# Patient Record
Sex: Female | Born: 1935
Health system: Southern US, Community
[De-identification: ages and names within clinical notes are randomized; demographics above are authoritative.]

## PROBLEM LIST (undated history)

## (undated) DIAGNOSIS — M8580 Other specified disorders of bone density and structure, unspecified site: Secondary | ICD-10-CM

## (undated) DIAGNOSIS — E785 Hyperlipidemia, unspecified: Secondary | ICD-10-CM

## (undated) DIAGNOSIS — E669 Obesity, unspecified: Secondary | ICD-10-CM

## (undated) DIAGNOSIS — G43909 Migraine, unspecified, not intractable, without status migrainosus: Secondary | ICD-10-CM

## (undated) DIAGNOSIS — D649 Anemia, unspecified: Secondary | ICD-10-CM

## (undated) DIAGNOSIS — IMO0002 Reserved for concepts with insufficient information to code with codable children: Secondary | ICD-10-CM

## (undated) DIAGNOSIS — K5790 Diverticulosis of intestine, part unspecified, without perforation or abscess without bleeding: Secondary | ICD-10-CM

## (undated) DIAGNOSIS — D229 Melanocytic nevi, unspecified: Secondary | ICD-10-CM

## (undated) DIAGNOSIS — F32A Depression, unspecified: Secondary | ICD-10-CM

## (undated) DIAGNOSIS — F329 Major depressive disorder, single episode, unspecified: Secondary | ICD-10-CM

## (undated) DIAGNOSIS — F410 Panic disorder [episodic paroxysmal anxiety] without agoraphobia: Secondary | ICD-10-CM

## (undated) DIAGNOSIS — K589 Irritable bowel syndrome without diarrhea: Secondary | ICD-10-CM

## (undated) DIAGNOSIS — T7840XA Allergy, unspecified, initial encounter: Secondary | ICD-10-CM

## (undated) DIAGNOSIS — I10 Essential (primary) hypertension: Secondary | ICD-10-CM

## (undated) HISTORY — DX: Migraine, unspecified, not intractable, without status migrainosus: G43.909

## (undated) HISTORY — DX: Melanocytic nevi, unspecified: D22.9

## (undated) HISTORY — DX: Obesity, unspecified: E66.9

## (undated) HISTORY — DX: Other specified disorders of bone density and structure, unspecified site: M85.80

## (undated) HISTORY — DX: Irritable bowel syndrome, unspecified: K58.9

## (undated) HISTORY — DX: Major depressive disorder, single episode, unspecified: F32.9

## (undated) HISTORY — DX: Diverticulosis of intestine, part unspecified, without perforation or abscess without bleeding: K57.90

## (undated) HISTORY — DX: Anemia, unspecified: D64.9

## (undated) HISTORY — DX: Panic disorder (episodic paroxysmal anxiety): F41.0

## (undated) HISTORY — DX: Hyperlipidemia, unspecified: E78.5

## (undated) HISTORY — DX: Reserved for concepts with insufficient information to code with codable children: IMO0002

## (undated) HISTORY — DX: Depression, unspecified: F32.A

## (undated) HISTORY — DX: Essential (primary) hypertension: I10

## (undated) HISTORY — DX: Allergy, unspecified, initial encounter: T78.40XA

---

## 2002-05-04 HISTORY — PX: OTHER SURGICAL HISTORY: SHX169

## 2005-01-19 ENCOUNTER — Ambulatory Visit: Payer: Self-pay | Admitting: Family Medicine

## 2005-02-19 ENCOUNTER — Ambulatory Visit: Payer: Self-pay | Admitting: Family Medicine

## 2005-02-19 ENCOUNTER — Other Ambulatory Visit: Admission: RE | Admit: 2005-02-19 | Discharge: 2005-02-19 | Payer: Self-pay | Admitting: Family Medicine

## 2005-04-02 ENCOUNTER — Ambulatory Visit: Payer: Self-pay | Admitting: Family Medicine

## 2005-05-22 ENCOUNTER — Ambulatory Visit: Payer: Self-pay | Admitting: Family Medicine

## 2005-07-07 ENCOUNTER — Ambulatory Visit: Payer: Self-pay | Admitting: Family Medicine

## 2005-08-03 ENCOUNTER — Encounter: Payer: Self-pay | Admitting: Family Medicine

## 2005-08-03 LAB — CONVERTED CEMR LAB

## 2005-08-25 ENCOUNTER — Ambulatory Visit: Payer: Self-pay | Admitting: Family Medicine

## 2005-09-30 ENCOUNTER — Ambulatory Visit: Payer: Self-pay | Admitting: Family Medicine

## 2005-10-07 ENCOUNTER — Ambulatory Visit: Payer: Self-pay | Admitting: Family Medicine

## 2005-10-26 ENCOUNTER — Ambulatory Visit: Payer: Self-pay | Admitting: Family Medicine

## 2005-11-30 ENCOUNTER — Ambulatory Visit: Payer: Self-pay | Admitting: Family Medicine

## 2006-01-25 ENCOUNTER — Ambulatory Visit: Payer: Self-pay | Admitting: Family Medicine

## 2006-02-04 ENCOUNTER — Ambulatory Visit: Payer: Self-pay | Admitting: Family Medicine

## 2006-02-09 DIAGNOSIS — G43909 Migraine, unspecified, not intractable, without status migrainosus: Secondary | ICD-10-CM | POA: Insufficient documentation

## 2006-02-09 DIAGNOSIS — F4310 Post-traumatic stress disorder, unspecified: Secondary | ICD-10-CM | POA: Insufficient documentation

## 2006-02-09 DIAGNOSIS — M899 Disorder of bone, unspecified: Secondary | ICD-10-CM | POA: Insufficient documentation

## 2006-02-09 DIAGNOSIS — E7800 Pure hypercholesterolemia, unspecified: Secondary | ICD-10-CM | POA: Insufficient documentation

## 2006-02-09 DIAGNOSIS — M949 Disorder of cartilage, unspecified: Secondary | ICD-10-CM

## 2006-02-09 DIAGNOSIS — I10 Essential (primary) hypertension: Secondary | ICD-10-CM | POA: Insufficient documentation

## 2006-02-16 ENCOUNTER — Encounter: Payer: Self-pay | Admitting: Family Medicine

## 2006-03-18 ENCOUNTER — Encounter: Payer: Self-pay | Admitting: Family Medicine

## 2006-03-18 LAB — CONVERTED CEMR LAB
Cholesterol: 201 mg/dL
HDL: 59 mg/dL
LDL Cholesterol: 111 mg/dL
Triglycerides: 153 mg/dL

## 2006-05-31 ENCOUNTER — Telehealth: Payer: Self-pay | Admitting: Family Medicine

## 2006-06-15 ENCOUNTER — Ambulatory Visit: Payer: Self-pay | Admitting: Family Medicine

## 2006-07-09 ENCOUNTER — Telehealth: Payer: Self-pay | Admitting: Family Medicine

## 2006-08-06 ENCOUNTER — Encounter: Payer: Self-pay | Admitting: Family Medicine

## 2006-08-12 ENCOUNTER — Ambulatory Visit: Payer: Self-pay | Admitting: Family Medicine

## 2006-08-27 ENCOUNTER — Telehealth: Payer: Self-pay | Admitting: Family Medicine

## 2006-09-23 ENCOUNTER — Ambulatory Visit: Payer: Self-pay | Admitting: Family Medicine

## 2006-09-23 DIAGNOSIS — J309 Allergic rhinitis, unspecified: Secondary | ICD-10-CM | POA: Insufficient documentation

## 2006-12-29 ENCOUNTER — Telehealth: Payer: Self-pay | Admitting: Family Medicine

## 2007-03-09 ENCOUNTER — Ambulatory Visit: Payer: Self-pay | Admitting: Family Medicine

## 2007-03-09 LAB — CONVERTED CEMR LAB
Cholesterol, target level: 200 mg/dL
HDL goal, serum: 40 mg/dL
LDL Goal: 130 mg/dL

## 2007-03-14 ENCOUNTER — Encounter: Payer: Self-pay | Admitting: Family Medicine

## 2007-03-15 ENCOUNTER — Encounter: Payer: Self-pay | Admitting: Family Medicine

## 2007-03-15 LAB — CONVERTED CEMR LAB
ALT: 21 units/L (ref 0–35)
AST: 21 units/L (ref 0–37)
Albumin: 4.5 g/dL (ref 3.5–5.2)
Alkaline Phosphatase: 66 units/L (ref 39–117)
BUN: 12 mg/dL (ref 6–23)
CO2: 24 meq/L (ref 19–32)
Calcium: 8.9 mg/dL (ref 8.4–10.5)
Chloride: 103 meq/L (ref 96–112)
Cholesterol: 207 mg/dL — ABNORMAL HIGH (ref 0–200)
Creatinine, Ser: 0.65 mg/dL (ref 0.40–1.20)
Glucose, Bld: 100 mg/dL — ABNORMAL HIGH (ref 70–99)
HDL: 72 mg/dL (ref >39)
LDL Cholesterol: 108 mg/dL — ABNORMAL HIGH (ref 0–99)
LDL Goal: 160 mg/dL
Potassium: 4.5 meq/L (ref 3.5–5.3)
Sodium: 139 meq/L (ref 135–145)
TSH: 1.532 microintl units/mL (ref 0.350–5.50)
Total Bilirubin: 0.4 mg/dL (ref 0.3–1.2)
Total CHOL/HDL Ratio: 2.9
Total Protein: 6.9 g/dL (ref 6.0–8.3)
Triglycerides: 134 mg/dL (ref <150)
VLDL: 27 mg/dL (ref 0–40)

## 2007-03-23 ENCOUNTER — Encounter: Payer: Self-pay | Admitting: Family Medicine

## 2007-05-10 ENCOUNTER — Telehealth: Payer: Self-pay | Admitting: Family Medicine

## 2007-05-11 ENCOUNTER — Encounter: Payer: Self-pay | Admitting: Family Medicine

## 2007-06-27 ENCOUNTER — Telehealth: Payer: Self-pay | Admitting: Family Medicine

## 2007-07-13 ENCOUNTER — Ambulatory Visit: Payer: Self-pay | Admitting: Family Medicine

## 2007-09-01 ENCOUNTER — Ambulatory Visit: Payer: Self-pay | Admitting: Gastroenterology

## 2007-09-09 ENCOUNTER — Ambulatory Visit: Payer: Self-pay | Admitting: Gastroenterology

## 2007-09-09 ENCOUNTER — Telehealth: Payer: Self-pay | Admitting: Family Medicine

## 2007-09-13 ENCOUNTER — Encounter: Payer: Self-pay | Admitting: Family Medicine

## 2007-09-14 ENCOUNTER — Ambulatory Visit: Payer: Self-pay | Admitting: Family Medicine

## 2007-09-15 LAB — CONVERTED CEMR LAB
ALT: 14 units/L (ref 0–35)
AST: 19 units/L (ref 0–37)
Albumin: 4.6 g/dL (ref 3.5–5.2)
Alkaline Phosphatase: 57 units/L (ref 39–117)
BUN: 16 mg/dL (ref 6–23)
CO2: 24 meq/L (ref 19–32)
Calcium: 9.2 mg/dL (ref 8.4–10.5)
Chloride: 106 meq/L (ref 96–112)
Cholesterol: 210 mg/dL — ABNORMAL HIGH (ref 0–200)
Creatinine, Ser: 0.64 mg/dL (ref 0.40–1.20)
Glucose, Bld: 85 mg/dL (ref 70–99)
HDL: 70 mg/dL (ref >39)
Helicobacter Pylori Antibody-IgG: 0.5
LDL Cholesterol: 113 mg/dL — ABNORMAL HIGH (ref 0–99)
Potassium: 4.5 meq/L (ref 3.5–5.3)
Sodium: 141 meq/L (ref 135–145)
TSH: 1.294 microintl units/mL (ref 0.350–5.50)
Total Bilirubin: 0.3 mg/dL (ref 0.3–1.2)
Total CHOL/HDL Ratio: 3
Total Protein: 6.5 g/dL (ref 6.0–8.3)
Triglycerides: 135 mg/dL (ref <150)
VLDL: 27 mg/dL (ref 0–40)

## 2007-09-20 ENCOUNTER — Encounter: Payer: Self-pay | Admitting: Family Medicine

## 2007-09-29 ENCOUNTER — Ambulatory Visit: Payer: Self-pay | Admitting: Family Medicine

## 2007-09-29 DIAGNOSIS — D239 Other benign neoplasm of skin, unspecified: Secondary | ICD-10-CM | POA: Insufficient documentation

## 2007-10-03 ENCOUNTER — Telehealth: Payer: Self-pay | Admitting: Family Medicine

## 2007-10-05 ENCOUNTER — Encounter: Payer: Self-pay | Admitting: Family Medicine

## 2007-10-05 ENCOUNTER — Ambulatory Visit (HOSPITAL_COMMUNITY): Payer: Self-pay | Admitting: Psychiatry

## 2007-10-19 ENCOUNTER — Ambulatory Visit (HOSPITAL_COMMUNITY): Payer: Self-pay | Admitting: Psychiatry

## 2007-10-31 ENCOUNTER — Telehealth: Payer: Self-pay | Admitting: Family Medicine

## 2007-11-02 ENCOUNTER — Ambulatory Visit (HOSPITAL_COMMUNITY): Payer: Self-pay | Admitting: Psychiatry

## 2007-11-25 ENCOUNTER — Telehealth: Payer: Self-pay | Admitting: Family Medicine

## 2008-01-05 ENCOUNTER — Ambulatory Visit: Payer: Self-pay | Admitting: Family Medicine

## 2008-01-06 LAB — CONVERTED CEMR LAB
Basophils Absolute: 0 10*3/uL (ref 0.0–0.1)
Basophils Relative: 0 % (ref 0–1)
Eosinophils Absolute: 0 10*3/uL (ref 0.0–0.7)
Eosinophils Relative: 1 % (ref 0–5)
HCT: 40.5 % (ref 36.0–46.0)
Hemoglobin: 13.1 g/dL (ref 12.0–15.0)
Lymphocytes Relative: 38 % (ref 12–46)
Lymphs Abs: 1.9 10*3/uL (ref 0.7–4.0)
MCHC: 32.3 g/dL (ref 30.0–36.0)
MCV: 89 fL (ref 78.0–100.0)
Monocytes Absolute: 0.3 10*3/uL (ref 0.1–1.0)
Monocytes Relative: 6 % (ref 3–12)
Neutro Abs: 2.8 10*3/uL (ref 1.7–7.7)
Neutrophils Relative %: 55 % (ref 43–77)
Platelets: 252 10*3/uL (ref 150–400)
RBC: 4.55 M/uL (ref 3.87–5.11)
RDW: 15.4 % (ref 11.5–15.5)
WBC: 5 10*3/uL (ref 4.0–10.5)

## 2008-01-26 ENCOUNTER — Telehealth: Payer: Self-pay | Admitting: Family Medicine

## 2008-02-01 ENCOUNTER — Encounter: Admission: RE | Admit: 2008-02-01 | Discharge: 2008-02-01 | Payer: Self-pay | Admitting: Family Medicine

## 2008-02-02 ENCOUNTER — Ambulatory Visit: Payer: Self-pay | Admitting: Family Medicine

## 2008-02-22 ENCOUNTER — Ambulatory Visit: Payer: Self-pay | Admitting: Family Medicine

## 2008-03-07 ENCOUNTER — Ambulatory Visit (HOSPITAL_COMMUNITY): Payer: Self-pay | Admitting: Psychiatry

## 2008-03-22 ENCOUNTER — Encounter: Payer: Self-pay | Admitting: Family Medicine

## 2008-03-22 LAB — CONVERTED CEMR LAB
Cholesterol: 169 mg/dL
Glucose, Bld: 93 mg/dL
HDL: 62 mg/dL
LDL Cholesterol: 89 mg/dL
Triglycerides: 92 mg/dL

## 2008-04-03 ENCOUNTER — Encounter: Payer: Self-pay | Admitting: Family Medicine

## 2008-04-04 ENCOUNTER — Ambulatory Visit (HOSPITAL_COMMUNITY): Payer: Self-pay | Admitting: Psychiatry

## 2008-04-12 ENCOUNTER — Ambulatory Visit: Payer: Self-pay | Admitting: Family Medicine

## 2008-04-12 DIAGNOSIS — S239XXA Sprain of unspecified parts of thorax, initial encounter: Secondary | ICD-10-CM | POA: Insufficient documentation

## 2008-04-18 ENCOUNTER — Ambulatory Visit (HOSPITAL_COMMUNITY): Payer: Self-pay | Admitting: Psychiatry

## 2008-05-01 ENCOUNTER — Telehealth: Payer: Self-pay | Admitting: Family Medicine

## 2008-05-29 ENCOUNTER — Telehealth (INDEPENDENT_AMBULATORY_CARE_PROVIDER_SITE_OTHER): Payer: Self-pay | Admitting: *Deleted

## 2008-06-04 ENCOUNTER — Telehealth: Payer: Self-pay | Admitting: Family Medicine

## 2008-06-12 ENCOUNTER — Encounter: Payer: Self-pay | Admitting: Family Medicine

## 2008-07-10 ENCOUNTER — Ambulatory Visit: Payer: Self-pay | Admitting: Family Medicine

## 2008-08-07 ENCOUNTER — Encounter: Payer: Self-pay | Admitting: Family Medicine

## 2008-08-07 ENCOUNTER — Ambulatory Visit: Payer: Self-pay | Admitting: Nurse Practitioner

## 2008-09-14 ENCOUNTER — Encounter: Payer: Self-pay | Admitting: Family Medicine

## 2008-09-18 ENCOUNTER — Encounter: Payer: Self-pay | Admitting: Family Medicine

## 2008-09-26 ENCOUNTER — Encounter: Payer: Self-pay | Admitting: Family Medicine

## 2008-09-27 ENCOUNTER — Ambulatory Visit: Payer: Self-pay | Admitting: Family Medicine

## 2008-10-26 ENCOUNTER — Encounter: Payer: Self-pay | Admitting: Family Medicine

## 2008-11-28 ENCOUNTER — Ambulatory Visit: Payer: Self-pay | Admitting: Family Medicine

## 2008-11-28 DIAGNOSIS — M5136 Other intervertebral disc degeneration, lumbar region: Secondary | ICD-10-CM | POA: Insufficient documentation

## 2008-11-28 DIAGNOSIS — M51369 Other intervertebral disc degeneration, lumbar region without mention of lumbar back pain or lower extremity pain: Secondary | ICD-10-CM | POA: Insufficient documentation

## 2008-12-19 ENCOUNTER — Encounter: Admission: RE | Admit: 2008-12-19 | Discharge: 2008-12-19 | Payer: Self-pay | Admitting: Family Medicine

## 2008-12-19 ENCOUNTER — Ambulatory Visit: Payer: Self-pay | Admitting: Family Medicine

## 2008-12-31 ENCOUNTER — Telehealth: Payer: Self-pay | Admitting: Family Medicine

## 2009-01-02 ENCOUNTER — Encounter: Admission: RE | Admit: 2009-01-02 | Discharge: 2009-04-02 | Payer: Self-pay | Admitting: Family Medicine

## 2009-01-03 ENCOUNTER — Encounter: Payer: Self-pay | Admitting: Family Medicine

## 2009-01-17 ENCOUNTER — Ambulatory Visit: Payer: Self-pay | Admitting: Family Medicine

## 2009-01-28 ENCOUNTER — Encounter: Payer: Self-pay | Admitting: Family Medicine

## 2009-02-05 ENCOUNTER — Encounter: Payer: Self-pay | Admitting: Family Medicine

## 2009-02-25 ENCOUNTER — Encounter: Payer: Self-pay | Admitting: Family Medicine

## 2009-02-26 ENCOUNTER — Encounter: Payer: Self-pay | Admitting: Family Medicine

## 2009-02-28 ENCOUNTER — Encounter: Payer: Self-pay | Admitting: Family Medicine

## 2009-03-06 ENCOUNTER — Encounter: Payer: Self-pay | Admitting: Family Medicine

## 2009-03-06 LAB — CONVERTED CEMR LAB
ALT: 15 units/L
AST: 23 units/L
Albumin: 4.5 g/dL
Alkaline Phosphatase: 81 units/L
BUN: 16 mg/dL
CO2: 25 meq/L
Calcium: 9.5 mg/dL
Chloride: 100 meq/L
Cholesterol: 164 mg/dL
Creatinine, Ser: 0.69 mg/dL
Glucose, Bld: 86 mg/dL
HDL: 73 mg/dL
LDL Cholesterol: 61 mg/dL
Potassium: 4.4 meq/L
Sodium: 138 meq/L
Triglyceride fasting, serum: 152 mg/dL

## 2009-03-07 ENCOUNTER — Encounter: Payer: Self-pay | Admitting: Family Medicine

## 2009-03-17 ENCOUNTER — Encounter: Admission: RE | Admit: 2009-03-17 | Discharge: 2009-03-17 | Payer: Self-pay | Admitting: Orthopedic Surgery

## 2009-03-20 ENCOUNTER — Encounter: Payer: Self-pay | Admitting: Family Medicine

## 2009-04-16 ENCOUNTER — Ambulatory Visit: Payer: Self-pay | Admitting: Family Medicine

## 2009-04-18 ENCOUNTER — Encounter: Payer: Self-pay | Admitting: Family Medicine

## 2009-04-19 ENCOUNTER — Telehealth: Payer: Self-pay | Admitting: Family Medicine

## 2009-05-06 ENCOUNTER — Telehealth: Payer: Self-pay | Admitting: Family Medicine

## 2009-05-13 ENCOUNTER — Telehealth: Payer: Self-pay | Admitting: Family Medicine

## 2009-06-11 ENCOUNTER — Telehealth (INDEPENDENT_AMBULATORY_CARE_PROVIDER_SITE_OTHER): Payer: Self-pay | Admitting: *Deleted

## 2009-06-28 ENCOUNTER — Ambulatory Visit: Payer: Self-pay | Admitting: Family Medicine

## 2009-07-03 ENCOUNTER — Encounter: Payer: Self-pay | Admitting: Family Medicine

## 2009-07-08 ENCOUNTER — Encounter: Payer: Self-pay | Admitting: Family Medicine

## 2009-07-12 ENCOUNTER — Telehealth: Payer: Self-pay | Admitting: Family Medicine

## 2009-07-18 ENCOUNTER — Encounter: Payer: Self-pay | Admitting: Family Medicine

## 2009-07-19 ENCOUNTER — Ambulatory Visit: Payer: Self-pay | Admitting: Family Medicine

## 2009-07-19 DIAGNOSIS — H9190 Unspecified hearing loss, unspecified ear: Secondary | ICD-10-CM | POA: Insufficient documentation

## 2009-07-31 ENCOUNTER — Encounter: Payer: Self-pay | Admitting: Family Medicine

## 2009-07-31 LAB — HM MAMMOGRAPHY: HM Mammogram: NORMAL

## 2009-08-02 ENCOUNTER — Telehealth: Payer: Self-pay | Admitting: Family Medicine

## 2009-08-20 ENCOUNTER — Encounter: Payer: Self-pay | Admitting: Family Medicine

## 2009-08-30 ENCOUNTER — Ambulatory Visit: Payer: Self-pay | Admitting: Family Medicine

## 2009-09-20 ENCOUNTER — Ambulatory Visit: Payer: Self-pay | Admitting: Family Medicine

## 2009-10-09 ENCOUNTER — Telehealth: Payer: Self-pay | Admitting: Family Medicine

## 2009-10-09 ENCOUNTER — Ambulatory Visit: Payer: Self-pay | Admitting: Family Medicine

## 2009-11-20 ENCOUNTER — Ambulatory Visit: Payer: Self-pay | Admitting: Family Medicine

## 2009-11-20 LAB — CONVERTED CEMR LAB
Bilirubin Urine: NEGATIVE
Glucose, Urine, Semiquant: NEGATIVE
Ketones, urine, test strip: NEGATIVE
Nitrite: NEGATIVE
Protein, U semiquant: 100
Specific Gravity, Urine: 1.01
Urobilinogen, UA: 0.2
pH: 5.5

## 2009-11-21 ENCOUNTER — Encounter: Payer: Self-pay | Admitting: Family Medicine

## 2009-12-06 ENCOUNTER — Ambulatory Visit: Payer: Self-pay | Admitting: Family Medicine

## 2009-12-06 LAB — CONVERTED CEMR LAB
Bilirubin Urine: NEGATIVE
Blood in Urine, dipstick: NEGATIVE
Glucose, Urine, Semiquant: NEGATIVE
Ketones, urine, test strip: NEGATIVE
Nitrite: NEGATIVE
Protein, U semiquant: NEGATIVE
Specific Gravity, Urine: 1.01
Urobilinogen, UA: 0.2
WBC Urine, dipstick: NEGATIVE
pH: 6.5

## 2010-01-03 ENCOUNTER — Ambulatory Visit: Payer: Self-pay | Admitting: Family Medicine

## 2010-01-03 ENCOUNTER — Telehealth: Payer: Self-pay | Admitting: Family Medicine

## 2010-01-03 LAB — CONVERTED CEMR LAB
Bilirubin Urine: NEGATIVE
Blood in Urine, dipstick: NEGATIVE
Glucose, Urine, Semiquant: NEGATIVE
Ketones, urine, test strip: NEGATIVE
Nitrite: NEGATIVE
Protein, U semiquant: NEGATIVE
Specific Gravity, Urine: 1.01
Urobilinogen, UA: 0.2
pH: 6

## 2010-01-04 ENCOUNTER — Encounter: Payer: Self-pay | Admitting: Family Medicine

## 2010-01-08 ENCOUNTER — Ambulatory Visit: Payer: Self-pay | Admitting: Family Medicine

## 2010-01-08 LAB — CONVERTED CEMR LAB
Bilirubin Urine: NEGATIVE
Blood in Urine, dipstick: NEGATIVE
Glucose, Urine, Semiquant: NEGATIVE
Ketones, urine, test strip: NEGATIVE
Nitrite: NEGATIVE
Protein, U semiquant: NEGATIVE
Specific Gravity, Urine: 1.01
Urobilinogen, UA: 0.2
WBC Urine, dipstick: NEGATIVE
pH: 6

## 2010-01-09 ENCOUNTER — Encounter: Payer: Self-pay | Admitting: Family Medicine

## 2010-01-24 ENCOUNTER — Ambulatory Visit: Payer: Self-pay | Admitting: Family Medicine

## 2010-02-19 ENCOUNTER — Encounter (INDEPENDENT_AMBULATORY_CARE_PROVIDER_SITE_OTHER): Payer: Self-pay | Admitting: *Deleted

## 2010-02-19 ENCOUNTER — Ambulatory Visit: Payer: Self-pay | Admitting: Family Medicine

## 2010-02-19 DIAGNOSIS — K529 Noninfective gastroenteritis and colitis, unspecified: Secondary | ICD-10-CM | POA: Insufficient documentation

## 2010-02-24 ENCOUNTER — Ambulatory Visit: Payer: Self-pay | Admitting: Gastroenterology

## 2010-03-19 ENCOUNTER — Encounter: Payer: Self-pay | Admitting: Family Medicine

## 2010-03-31 ENCOUNTER — Ambulatory Visit: Payer: Self-pay | Admitting: Gastroenterology

## 2010-04-04 ENCOUNTER — Encounter: Payer: Self-pay | Admitting: Gastroenterology

## 2010-04-09 ENCOUNTER — Ambulatory Visit: Payer: Self-pay | Admitting: Family Medicine

## 2010-04-09 DIAGNOSIS — J069 Acute upper respiratory infection, unspecified: Secondary | ICD-10-CM | POA: Insufficient documentation

## 2010-04-09 LAB — CONVERTED CEMR LAB: Rapid Strep: NEGATIVE

## 2010-05-15 ENCOUNTER — Ambulatory Visit
Admission: RE | Admit: 2010-05-15 | Discharge: 2010-05-15 | Payer: Self-pay | Source: Home / Self Care | Attending: Gastroenterology | Admitting: Gastroenterology

## 2010-06-03 NOTE — Assessment & Plan Note (Signed)
Summary: NAUSEA   Vital Signs:  Patient Profile:   76 Years Old Female CC:      Nausea, diarrhea, lower abdominal pain X 1 week Height:     67.25 inches Weight:      207 pounds O2 Sat:      95 % O2 treatment:    Room Air Temp:     97.4 degrees F oral Pulse rate:   72 / minute Pulse rhythm:   regular Resp:     14 per minute BP sitting:   107 / 69  (right arm) Cuff size:   large  Pt. in pain?   yes    Location:   abdomen    Type:       cramping  Vitals Entered By: Lajean Saver RN (October 09, 2009 11:15 AM)                   Prior Medication List:  ENALAPRIL MALEATE 5 MG TABS (ENALAPRIL MALEATE) Take 1 tablet by mouth once a day METOPROLOL TARTRATE 25 MG  TABS (METOPROLOL TARTRATE) Take 1 tablet by mouth two times a day ZOCOR 40 MG  TABS (SIMVASTATIN) Take 1 tablet by mouth once a day at bedtime ALPRAZOLAM 0.5 MG  TABS (ALPRAZOLAM) Take 1 tablet by mouth two times a day as needed TRAZODONE HCL 50 MG  TABS (TRAZODONE HCL) Take 1 tablet by mouth once a day at bedtime FLEXERIL 10 MG TABS (CYCLOBENZAPRINE HCL) Take 1 tablet by mouth tid prn muscle spasm. ZOLOFT 100 MG TABS (SERTRALINE HCL) 2 tabs by mouth daily. TRAMADOL HCL 50 MG TABS (TRAMADOL HCL) 1 tab by mouth two times a day as needed back pain ABILIFY 2 MG TABS (ARIPIPRAZOLE) 1 tab by mouth qAM   Updated Prior Medication List: ENALAPRIL MALEATE 5 MG TABS (ENALAPRIL MALEATE) Take 1 tablet by mouth once a day METOPROLOL TARTRATE 25 MG  TABS (METOPROLOL TARTRATE) Take 1 tablet by mouth two times a day ZOCOR 40 MG  TABS (SIMVASTATIN) Take 1 tablet by mouth once a day at bedtime ALPRAZOLAM 0.5 MG  TABS (ALPRAZOLAM) Take 1 tablet by mouth two times a day as needed TRAZODONE HCL 50 MG  TABS (TRAZODONE HCL) Take 1 tablet by mouth once a day at bedtime FLEXERIL 10 MG TABS (CYCLOBENZAPRINE HCL) Take 1 tablet by mouth tid prn muscle spasm. ZOLOFT 100 MG TABS (SERTRALINE HCL) 2 tabs by mouth daily. IMODIUM A-D 2 MG TABS  (LOPERAMIDE HCL) as needed  Current Allergies (reviewed today): ! KEFLEX ! SULFA ! * INSECT VENOM ! * SHELLFISHHistory of Present Illness Chief Complaint: Nausea, diarrhea, lower abdominal pain X 1 week History of Present Illness: Pain in abdomen about a week ago it started. Sarvel times last week she would eat and have pain and urgency to defecate. When she did defacate it was forceful. The pain was relieved and no problem until the next evening and it started again. Things started gettting better until yesterdaywhen she had a relapse after eating. Today she feels sick and nauseous. She does have IBS that is usually controlled by diet.   Current Problems: DIARRHEA (ICD-787.91) NAUSEA (ICD-787.02) ABDOMINAL PAIN, LEFT LOWER QUADRANT (ICD-789.04) UNSPECIFIED HEARING LOSS (ICD-389.9) LUMBAR SPRAIN AND STRAIN (ICD-847.2) BACK STRAIN, THORACIC (ICD-847.1) NEVUS, ATYPICAL (ICD-216.9) GASTRIC ULCER (ICD-531.90) RHINITIS, ALLERGIC NOS (ICD-477.9) PANIC ATTACKS (ICD-300.01) OSTEOPENIA (ICD-733.90) MIGRAINE, UNSPEC., W/O INTRACTABLE MIGRAINE (ICD-346.90) HYPERTENSION, BENIGN SYSTEMIC (ICD-401.1) HYPERCHOLESTEROLEMIA (ICD-272.0) DERMATITIS, PERIORAL (ICD-695.3)   Current Meds ENALAPRIL MALEATE 5 MG TABS (ENALAPRIL  MALEATE) Take 1 tablet by mouth once a day METOPROLOL TARTRATE 25 MG  TABS (METOPROLOL TARTRATE) Take 1 tablet by mouth two times a day ZOCOR 40 MG  TABS (SIMVASTATIN) Take 1 tablet by mouth once a day at bedtime ALPRAZOLAM 0.5 MG  TABS (ALPRAZOLAM) Take 1 tablet by mouth two times a day as needed TRAZODONE HCL 50 MG  TABS (TRAZODONE HCL) Take 1 tablet by mouth once a day at bedtime FLEXERIL 10 MG TABS (CYCLOBENZAPRINE HCL) Take 1 tablet by mouth tid prn muscle spasm. ZOLOFT 100 MG TABS (SERTRALINE HCL) 2 tabs by mouth daily. IMODIUM A-D 2 MG TABS (LOPERAMIDE HCL) as needed AUGMENTIN 875-125 MG TABS (AMOXICILLIN-POT CLAVULANATE) 1 by mouth 2 times daily METRONIDAZOLE 500 MG  TABS  (METRONIDAZOLE) si 1 by mouth twice a day do not  take w/etoh PROMETHAZINE HCL 25 MG  TABS (PROMETHAZINE HCL) sig 1 by mouth q 6-8hrs prn  REVIEW OF SYSTEMS Constitutional Symptoms       Complains of chills.     Denies fever, night sweats, weight loss, weight gain, and fatigue.  Eyes       Denies change in vision, eye pain, eye discharge, glasses, contact lenses, and eye surgery. Ear/Nose/Throat/Mouth       Complains of hearing loss/aids.      Denies change in hearing, ear pain, ear discharge, dizziness, frequent runny nose, frequent nose bleeds, sinus problems, sore throat, hoarseness, and tooth pain or bleeding.  Respiratory       Denies dry cough, productive cough, wheezing, shortness of breath, asthma, bronchitis, and emphysema/COPD.  Cardiovascular       Denies murmurs, chest pain, and tires easily with exhertion.    Gastrointestinal       Complains of stomach pain, nausea/vomiting, and diarrhea.      Denies constipation, blood in bowel movements, and indigestion.      Comments: no vomiting Genitourniary       Denies painful urination, kidney stones, and loss of urinary control. Neurological       Denies paralysis, seizures, and fainting/blackouts. Musculoskeletal       Denies muscle pain, joint pain, joint stiffness, decreased range of motion, redness, swelling, muscle weakness, and gout.  Skin       Denies bruising, unusual mles/lumps or sores, and hair/skin or nail changes.  Psych       Complains of anxiety/stress and depression.      Denies mood changes, temper/anger issues, speech problems, and sleep problems. Other Comments: GI symptoms usually occur after she eats X 1 week she had colonoscopy about  1 1/2 years ago had a suspectedv case of diverticulitis a couple of years ago   Past History:  Family History: Last updated: 02/16/2006 Brother-HTN  Father - DM, HTN, stroke  Mother - Depression  Social History: Last updated: 10/09/2009 Married to [place] with 2  children ages 61, 24.  LMP greater than 10 yrs ago.   Quit smoking 1997.   3# EtOH per week  Walks 20 min per day.  No longer sexually active.  Past Medical History: Reviewed history from 02/16/2006 and no changes required. Hx of Depression  Hx of GI ulcer , Hx Seasonal Allergies  Meds: Meprobamate 400mg  BID #80/mo  Past Surgical History: Cataract surgery Bilat - 2004  RUQ U/S 10/06- negative  Family History: Reviewed history from 02/16/2006 and no changes required. Brother-HTN  Father - DM, HTN, stroke  Mother - Depression  Social History: Reviewed history from 02/09/2006 and no changes  required. Married to [place] with 2 children ages 48, 43.  LMP greater than 10 yrs ago.   Quit smoking 1997.   3# EtOH per week  Walks 20 min per day.  No longer sexually active. Physical Exam General appearance: well developed, well nourished, mild distress Head: normocephalic, atraumatic Abdomen: L lower quadrant focal tenderness, abdomen soft without obvious organomegaly Skin: no obvious rashes or lesions MSE: oriented to time, place, and person rectal exam done negative for blood no increase rectal tenderness noted Assessment New Problems: DIARRHEA (ICD-787.91) NAUSEA (ICD-787.02) ABDOMINAL PAIN, LEFT LOWER QUADRANT (ICD-789.04)  probable diverticulitis  Patient Education: Patient and/or caregiver instructed in the following: rest fluids and Tylenol.  Plan New Medications/Changes: PROMETHAZINE HCL 25 MG  TABS (PROMETHAZINE HCL) sig 1 by mouth q 6-8hrs prn  #12 x 0, 10/09/2009, Hassan Rowan MD METRONIDAZOLE 500 MG  TABS (METRONIDAZOLE) si 1 by mouth twice a day do not  take w/etoh  #20 x 0, 10/09/2009, Hassan Rowan MD AUGMENTIN [phone] MG TABS (AMOXICILLIN-POT CLAVULANATE) 1 by mouth 2 times daily  #20 x 0, 10/09/2009, Hassan Rowan MD  New Orders: T-CBC w/Diff [[id number]] Zofran 1mg . injection [J2405] Admin of Therapeutic Inj  intramuscular or subcutaneous [96372] New Patient  Level IV [99204] Hemoccult Guaiac-1 spec.(in office) [82270] Planning Comments:   if not better patient will go to ED for evaluation since unable t5o get CT scan  Follow Up: Follow up in 2-3 days if no improvement, Follow up with Primary Physician  The patient and/or caregiver has been counseled thoroughly with regard to medications prescribed including dosage, schedule, interactions, rationale for use, and possible side effects and they verbalize understanding.  Diagnoses and expected course of recovery discussed and will return if not improved as expected or if the condition worsens. Patient and/or caregiver verbalized understanding.  Prescriptions: PROMETHAZINE HCL 25 MG  TABS (PROMETHAZINE HCL) sig 1 by mouth q 6-8hrs prn  #12 x 0   Entered and Authorized by:   Hassan Rowan MD   Signed by:   Hassan Rowan MD on 10/09/2009   Method used:   Printed then faxed to ...       [facility] [phone]* (retail)       [address] [place], [place]  [address]       Ph: [phone]       Fax: [phone]   RxID:   [id number] METRONIDAZOLE 500 MG  TABS (METRONIDAZOLE) si 1 by mouth twice a day do not  take w/etoh  #20 x 0   Entered and Authorized by:   Hassan Rowan MD   Signed by:   Hassan Rowan MD on 10/09/2009   Method used:   Printed then faxed to ...       [facility] [phone]* (retail)       [address] [place], [place]  [address]       Ph: [phone]       Fax: [phone]   RxID:   [id number] AUGMENTIN 875-125 MG TABS (AMOXICILLIN-POT CLAVULANATE) 1 by mouth 2 times daily  #20 x 0   Entered and Authorized by:   Hassan Rowan MD   Signed by:   Hassan Rowan MD on 10/09/2009   Method used:   Printed then faxed to ...       [facility] [phone]* (retail)  [address], [place]  [address]       Ph: [phone]       Fax: [phone]   RxID:   [id number]   Patient Instructions: 1)  Please schedule an appointment with your primary doctor in  : 2)  a follow-up appointment in 1-2 weeks.ED BY INSUR[facility]IVERAGE HEREIf symptoms wordse go to local ED for [facility] and futher evaluation 3)  Take your antibiotic as prescribed until ALL of it is gone, but stop if you develop a rash or swelling and contact our office as soon as possible.  Medication Administration  Injection # 1:    Medication: Zofran 1mg . injection    Diagnosis: NAUSEA (ICD-787.02)    Route: IM    Site: RUOQ gluteus    Exp Date: 03/05/2011    Lot #: [id number]    Mfr: Baxter    Comments: 4 mgs given    Given by: Emilio Math (October 09, 2009 12:21 PM)  Orders Added: 1)  T-CBC w/Diff [[id number]] 2)  Zofran 1mg . injection [J2405] 3)  Admin of Therapeutic Inj  intramuscular or subcutaneous [96372] 4)  New Patient Level IV [99204] 5)  Hemoccult Guaiac-1 spec.(in office) [82270]   Medication Administration  Injection # 1:    Medication: Zofran 1mg . injection    Diagnosis: NAUSEA (ICD-787.02)    Route: IM    Site: RUOQ gluteus    Exp Date: 03/05/2011    Lot #: [id number]    Mfr: Baxter    Comments: 4 mgs given    Given by: Emilio Math (October 09, 2009 12:21 PM)  Orders Added: 1)  T-CBC w/Diff [[id number]] 2)  Zofran 1mg . injection [J2405] 3)  Admin of Therapeutic Inj  intramuscular or subcutaneous [96372] 4)  New Patient Level IV [99204] 5)  Hemoccult Guaiac-1 spec.(in office) [99242]

## 2010-06-03 NOTE — Letter (Signed)
Summary: [facility]  [address] [place], [place] [address]   Phone: [phone]  Fax: [phone]       ARITZA BRUNET    10/31/1934    MRN: [id number]        Procedure Day /Date:MONDAY 03/31/2010     Arrival Time:1PM     Procedure Time:2PM     Location of Procedure:                    X   [facility] (4th Floor)  PREPARATION FOR FLEXIBLE SIGMOIDOSCOPY WITH MAGNESIUM CITRATE  Prior to the day before your procedure, purchase one 8 oz. bottle of Magnesium Citrate and one Fleet Enema from the laxative section of your drugstore.  _________________________________________________________________________________________________  THE DAY BEFORE YOUR PROCEDURE             DATE:03/30/2010   DAY: SUNDAY  1.   Have a clear liquid dinner the night before your procedure.  2.   Do not drink anything colored red or purple.  Avoid juices with pulp.  No orange juice.              CLEAR LIQUIDS INCLUDE: Water Jello Ice Popsicles Tea (sugar ok, no milk/cream) Powdered fruit flavored drinks Coffee (sugar ok, no milk/cream) Gatorade Juice: apple, white grape, white cranberry  Lemonade Clear bullion, consomm, broth Carbonated beverages (any kind) Strained chicken noodle soup Hard Candy   3.   At 7:00 pm the night before your procedure, drink one bottle of Magnesium Citrate over ice.  4.   Drink at least 3 more glasses of clear liquids before bedtime (preferably juices).  5.   Results are expected usually within 1 to 6 hours after taking the Magnesium Citrate.  ___________________________________________________________________________________________________  THE DAY OF YOUR PROCEDURE            DATE: 11/28/2011DAY: MONDAY  1.   Use Fleet Enema one hour prior to coming for procedure.  2.   You may drink clear liquids until 12PM (2 hours before exam)       MEDICATION INSTRUCTIONS  Unless otherwise instructed, you should  take regular prescription medications with a small sip of water as early as possible the morning of your procedure.          OTHER INSTRUCTIONS  You will need a responsible adult at least 74 years of age to accompany you and drive you home.   This person must remain in the waiting room during your procedure.  Wear loose fitting clothing that is easily removed.  Leave jewelry and other valuables at home.  However, you may wish to bring a book to read or an iPod/MP3 player to listen to music as you wait for your procedure to start.  Remove all body piercing jewelry and leave at home.  Total time from sign-in until discharge is approximately 2-3 hours.  You should go home directly after your procedure and rest.  You can resume normal activities the day after your procedure.  The day of your procedure you should not:   Drive   Make legal decisions   Operate machinery   Drink alcohol   Return to work  You will receive specific instructions about eating, activities and medications before you leave.   The above instructions have been reviewed and explained to me by   _______________________    I fully understand and can verbalize these instructions _____________________________ Date _________

## 2010-06-03 NOTE — Medication Information (Signed)
Summary: Denial for Meprobamate/[facility]  Denial for Meprobamate/[facility]   Imported By: Lanelle Bal 10/30/2008 15:20:07  _____________________________________________________________________  External Attachment:    Type:   Image     Comment:   External Document

## 2010-06-03 NOTE — Letter (Signed)
Summary: Healthy Living Assesment  Healthy Living Assesment   Imported By: Kathlene November 04/05/2007 16:37:59  _____________________________________________________________________  External Attachment:    Type:   Image     Comment:   External Document

## 2010-06-03 NOTE — Letter (Signed)
Summary: Anxiety Questionnaire  Anxiety Questionnaire   Imported By: Lanelle Bal 11/14/2009 09:01:30  _____________________________________________________________________  External Attachment:    Type:   Image     Comment:   External Document

## 2010-06-03 NOTE — Assessment & Plan Note (Signed)
Summary: FU ON LABS,TESTS   Vital Signs:  Patient Profile:   76 Years Old Female Height:     67.25 inches Weight:      191 pounds Pulse rate:   56 / minute BP sitting:   121 / 64  (left arm) Cuff size:   large  Vitals Entered By: Kathlene November (Sep 14, 2007 8:34 AM)                 PCP:  Cipriano Bunker  Chief Complaint:  F/U BP and discuss cholesterol. Having a hard time weaning off the Effexor having alot of night terrors.  History of Present Illness: Has weaned down to Effexor 25mg  at bedtime and having night terrors.  Wants to get off and start trazodone instead to still help with her sleep.  She is off teh Buspar completely and says like how the xanax works better and would like a Rx for that.   Also wants to discuss her labs. Reviewed these with her. Dtill waiting for the H.pylori result.     Current Allergies: ! KEFLEX ! SULFA ! * INSECT VENOM ! * SHELLFISH      Physical Exam  General:     Well-developed,well-nourished,in no acute distress; alert,appropriate and cooperative throughout examination.  Appears younger than state age.  Head:     Normocephalic and atraumatic without obvious abnormalities. No apparent alopecia or balding. Neck:     No deformities, masses, or tenderness noted. Lungs:     Normal respiratory effort, chest expands symmetrically. Lungs are clear to auscultation, no crackles or wheezes. Heart:     Normal rate and regular rhythm. S1 and S2 normal without gallop, murmur, click, rub or other extra sounds. Pulses:     Radial 2+ Extremities:     NO LE edema.  Cervical Nodes:     No lymphadenopathy noted Psych:     Cognition and judgment appear intact. Alert and cooperative with normal attention span and concentration. No apparent delusions, illusions, hallucinations    Impression & Recommendations:  Problem # 1:  HYPERTENSION, BENIGN SYSTEMIC (ICD-401.1) Stop the enalapril for now and see how she does on just the metoprolol, since she  does have migraines.  Her updated medication list for this problem includes:    Enalapril Maleate 5 Mg Tabs (Enalapril maleate) .Marland Kitchen... Take 1 tablet by mouth once a day    Metoprolol Tartrate 25 Mg Tabs (Metoprolol tartrate) .Marland Kitchen... Take 1 tablet by mouth two times a day  BP today: 121/64 Prior BP: 99/54 (07/13/2007)  Prior 10 Yr Risk Heart Disease: 4 % (03/15/2007)  Labs Reviewed: Creat: 0.65 (03/14/2007) Chol: 207 (03/14/2007)   HDL: 72 (03/14/2007)   LDL: 108 (03/14/2007)   TG: 134 (03/14/2007)   Problem # 2:  HYPERCHOLESTEROLEMIA (ICD-272.0) Increase dose to 40mg  to get her LDL less than 100 esp since she is insulin resistant.  Her updated medication list for this problem includes:    Zocor 40 Mg Tabs (Simvastatin) .Marland Kitchen... Take 1 tablet by mouth once a day at bedtime   Problem # 3:  PANIC ATTACKS (ICD-300.01) Doing well. OVerlap the Effexor and trazodone for 5 days then stop the effexor. REfiledl the Xanx.    The following medications were removed from the medication list:    Effexor 25 Mg Tabs (Venlafaxine hcl) .Marland Kitchen... Take 1 tablet by mouth two times a day  Her updated medication list for this problem includes:    Hydroxyzine Hcl 25 Mg/ml Soln (Hydroxyzine hcl) ..... One  by mouth prn    Meprobamate 400 Mg Tabs (Meprobamate) ..... One by mouth  2-3 times a day.    Alprazolam 0.5 Mg Tabs (Alprazolam) .Marland Kitchen... Take 1 tablet by mouth once a day    Trazodone Hcl 50 Mg Tabs (Trazodone hcl) .Marland Kitchen... Take 1 tablet by mouth once a day at bedtime   Complete Medication List: 1)  Hydroxyzine Hcl 25 Mg/ml Soln (Hydroxyzine hcl) .... One by mouth prn 2)  Enalapril Maleate 5 Mg Tabs (Enalapril maleate) .... Take 1 tablet by mouth once a day 3)  Loratadine 10 Mg Tabs (Loratadine) .... One by mouth daily prn 4)  Metoprolol Tartrate 25 Mg Tabs (Metoprolol tartrate) .... Take 1 tablet by mouth two times a day 5)  Zocor 40 Mg Tabs (Simvastatin) .... Take 1 tablet by mouth once a day at bedtime 6)   Meprobamate 400 Mg Tabs (Meprobamate) .... One by mouth  2-3 times a day. 7)  Alprazolam 0.5 Mg Tabs (Alprazolam) .... Take 1 tablet by mouth once a day 8)  Miralax Powd (Polyethylene glycol 3350) .... As per prep  instructions. 9)  Metoclopramide Hcl 10 Mg Tabs (Metoclopramide hcl) .... As per prep instructions. 10)  Trazodone Hcl 50 Mg Tabs (Trazodone hcl) .... Take 1 tablet by mouth once a day at bedtime    Prescriptions: TRAZODONE HCL 50 MG  TABS (TRAZODONE HCL) Take 1 tablet by mouth once a day at bedtime  #30 x 1   Entered and Authorized by:   Nani Gasser MD   Signed by:   Nani Gasser MD on 09/14/2007   Method used:   Print then Give to Patient   RxID:   [id number] ZOCOR 40 MG  TABS (SIMVASTATIN) Take 1 tablet by mouth once a day at bedtime  #90 x 3   Entered and Authorized by:   Nani Gasser MD   Signed by:   Nani Gasser MD on 09/14/2007   Method used:   Print then Give to Patient   RxID:   [id number] ALPRAZOLAM 0.5 MG  TABS (ALPRAZOLAM) Take 1 tablet by mouth once a day  #30 x 1   Entered and Authorized by:   Nani Gasser MD   Signed by:   Nani Gasser MD on 09/14/2007   Method used:   Print then Give to Patient   RxID:   [id number]  ]

## 2010-06-03 NOTE — Progress Notes (Signed)
Summary: [place] Rx out of state  Phone Note Call from Patient   Reason for Call: Refill Medication Summary of Call: Dr. Shelah Lewandowsky patient is in [place] w/ family emergency.  Pharmacy there is unable to fill Neprobamate 400mg , however, they do have it in 200mg  form.  If we could just call or fax to [facility] there a script for 2 pills per day at 200 mg.  [phone] Fax-[phone] Initial call taken by: Margot Chimes,  May 31, 2006 9:58 AM  Follow-up for Phone Call        OK to call in Meprobamate 200 mg 2 tabs by mouth three times a day as needed #20 no refills to pharmacy in [facility]. Follow-up by: Seymour Bars D.O.,  May 31, 2006 11:44 AM  Additional Follow-up for Phone Call Additional follow up Details #1::        Med called into [facility] in [place] per Dr. Misty Stanley instructions. KJ LPN Additional Follow-up by: Kathlene November,  May 31, 2006 12:51 PM

## 2010-06-03 NOTE — Assessment & Plan Note (Signed)
Summary: FU REHAB for Back pain, HTN   Vital Signs:  Patient profile:   76 year old female Height:      67.25 inches Weight:      203 pounds Pulse rate:   82 / minute BP sitting:   106 / 64  (left arm) Cuff size:   regular  Vitals Entered By: Kathlene November (January 17, 2009 9:03 AM) CC: follow-up on back pain. Since doing PT pt states has learned how to lessen the pain   Primary Care Provider:  Linford Arnold, C  CC:  follow-up on back pain. Since doing PT pt states has learned how to lessen the pain.  History of Present Illness: follow-up on back pain. Since doing PT pt states has learned how to lessen the pain.  as learned some techniques that really help eas her pain when it ges seere.  Doin ang repetative exercises daily.  She is tolerating this well. Will use her tramadol occ.   Current Medications (verified): 1)  Enalapril Maleate 5 Mg Tabs (Enalapril Maleate) .... Take 1 Tablet By Mouth Once A Day 2)  Metoprolol Tartrate 25 Mg  Tabs (Metoprolol Tartrate) .... Take 1 Tablet By Mouth Two Times A Day 3)  Zocor 40 Mg  Tabs (Simvastatin) .... Take 1 Tablet By Mouth Once A Day At Bedtime 4)  Alprazolam 0.5 Mg  Tabs (Alprazolam) .... Take 1 Tablet By Mouth Once A Day 5)  Trazodone Hcl 50 Mg  Tabs (Trazodone Hcl) .... Take 1 Tablet By Mouth Once A Day At Bedtime 6)  Flexeril 10 Mg Tabs (Cyclobenzaprine Hcl) .... Take 1 Tablet By Mouth Tid Prn Muscle Spasm. 7)  Venlafaxine Hcl 100 Mg Tabs (Venlafaxine Hcl) .... Take 1 Tablet By Mouth Two Times A Day 8)  Meprobamate 400 Mg Tabs (Meprobamate) .... Take 1 Tablet By Mouth Once A Day As Needed Migraine. 9)  Tramadol Hcl 50 Mg Tabs (Tramadol Hcl) .... Take 1 Tablet By Mouth Three Times A Day As Needed  Allergies (verified): 1)  ! Keflex 2)  ! Sulfa 3)  ! * Insect Venom 4)  ! * Shellfish  Comments:  Nurse/Medical Assistant: The patient's medications and allergies were reviewed with the patient and were updated in the Medication and  Allergy Lists. Kathlene November (January 17, 2009 9:04 AM)  Physical Exam  General:  Well-developed,well-nourished,in no acute distress; alert,appropriate and cooperative throughout examination Head:  Normocephalic and atraumatic without obvious abnormalities. No apparent alopecia or balding. Lungs:  Normal respiratory effort, chest expands symmetrically. Lungs are clear to auscultation, no crackles or wheezes. Heart:  Normal rate and regular rhythm. S1 and S2 normal without gallop, murmur, click, rub or other extra sounds. Msk:  NOrmal felxion, extension, rotationg right andleft of the lumbar spine. tender over the mid thoracic spine. Nontender parapsinous muscles. LE and UE 5/5. Neck with NROM.  Neurologic:  Patellar 2+ bilat.  Skin:  no rashes.   Psych:  Cognition and judgment appear intact. Alert and cooperative with normal attention span and concentration. No apparent delusions, illusions, hallucinations   Impression & Recommendations:  Problem # 1:  BACK STRAIN, THORACIC (ICD-847.1) She is improving with PT so she canceled her appt with Dr. Shon Baton. Says wants to complete this first.  Call if needs refill on the tramadoll.   Problem # 2:  LUMBAR SPRAIN AND STRAIN (ICD-847.2) She is improving with PT so she canceled her appt with Dr. Shon Baton. Says wants to complete this first.    Problem #  3:  HYPERTENSION, BENIGN SYSTEMIC (ICD-401.1) Great! Due for refills today.   Her updated medication list for this problem includes:    Enalapril Maleate 5 Mg Tabs (Enalapril maleate) .Marland Kitchen... Take 1 tablet by mouth once a day    Metoprolol Tartrate 25 Mg Tabs (Metoprolol tartrate) .Marland Kitchen... Take 1 tablet by mouth two times a day  BP today: 106/64 Prior BP: 115/69 (12/19/2008)  Prior 10 Yr Risk Heart Disease: 3 % (11/28/2008)  Labs Reviewed: K+: 4.5 (09/13/2007) Creat: : 0.64 (09/13/2007)   Chol: 169 (03/22/2008)   HDL: 62 (03/22/2008)   LDL: 89 (03/22/2008)   TG: 92 (03/22/2008)  Complete Medication  List: 1)  Enalapril Maleate 5 Mg Tabs (Enalapril maleate) .... Take 1 tablet by mouth once a day 2)  Metoprolol Tartrate 25 Mg Tabs (Metoprolol tartrate) .... Take 1 tablet by mouth two times a day 3)  Zocor 40 Mg Tabs (Simvastatin) .... Take 1 tablet by mouth once a day at bedtime 4)  Alprazolam 0.5 Mg Tabs (Alprazolam) .... Take 1 tablet by mouth once a day 5)  Trazodone Hcl 50 Mg Tabs (Trazodone hcl) .... Take 1 tablet by mouth once a day at bedtime 6)  Flexeril 10 Mg Tabs (Cyclobenzaprine hcl) .... Take 1 tablet by mouth tid prn muscle spasm. 7)  Venlafaxine Hcl 100 Mg Tabs (Venlafaxine hcl) .... Take 1 tablet by mouth two times a day 8)  Meprobamate 400 Mg Tabs (Meprobamate) .... Take 1 tablet by mouth once a day as needed migraine. 9)  Tramadol Hcl 50 Mg Tabs (Tramadol hcl) .... Take 1 tablet by mouth three times a day as needed Prescriptions: METOPROLOL TARTRATE 25 MG  TABS (METOPROLOL TARTRATE) Take 1 tablet by mouth two times a day  #0 x 3   Entered and Authorized by:   Nani Gasser MD   Signed by:   Nani Gasser MD on 01/17/2009   Method used:   Print then Give to Patient   RxID:   [id number] ENALAPRIL MALEATE 5 MG TABS (ENALAPRIL MALEATE) Take 1 tablet by mouth once a day  #90 x 3   Entered and Authorized by:   Nani Gasser MD   Signed by:   Nani Gasser MD on 01/17/2009   Method used:   Print then Give to Patient   RxID:   [id number]   Bone Density Result Date:  09/26/2008 Bone Density Result:  normal Bone Density Next Due: 2 yr

## 2010-06-03 NOTE — Progress Notes (Signed)
Summary: alternative med to meprobamate  Phone Note Call from Patient Call back at Home Phone [phone]   Caller: Patient Summary of Call: taking meprobromate,  medicare denied med, so paying out of pocket.  Wants to know is Tresa Garter an option for me? because insurance co will pay for it. If so would like rx sent to [facility] Initial call taken by: Kandice Hams,  June 11, 2009 3:49 PM  Follow-up for Phone Call        Tresa Garter is really more of a muscle relaxer. IN another category. We could try relafen. Can call in 30 days supply to local pharm for you to try.  Follow-up by: Nani Gasser MD,  June 11, 2009 4:03 PM  Additional Follow-up for Phone Call Additional follow up Details #1::        Spoke with pt and informed of recommendations she says she will just stay with what she is on.Kandice Hams  June 12, 2009 11:21 AM  Additional Follow-up by: Kandice Hams,  June 12, 2009 11:21 AM        Complete Medication List: 1)  Enalapril Maleate 5 Mg Tabs (Enalapril maleate) .... Take 1 tablet by mouth once a day 2)  Metoprolol Tartrate 25 Mg Tabs (Metoprolol tartrate) .... Take 1 tablet by mouth two times a day 3)  Zocor 40 Mg Tabs (Simvastatin) .... Take 1 tablet by mouth once a day at bedtime 4)  Alprazolam 0.5 Mg Tabs (Alprazolam) .... Take 1 tablet by mouth once a day 5)  Trazodone Hcl 50 Mg Tabs (Trazodone hcl) .... Take 1 tablet by mouth once a day at bedtime 6)  Flexeril 10 Mg Tabs (Cyclobenzaprine hcl) .... Take 1 tablet by mouth tid prn muscle spasm. 7)  Venlafaxine Hcl 100 Mg Tabs (Venlafaxine hcl) .... Take 1 tablet by mouth two times a day 8)  Meprobamate 400 Mg Tabs (Meprobamate) .... Take 1 tablet by mouth once a day as needed migraine. 9)  Tramadol Hcl 50 Mg Tabs (Tramadol hcl) .... Take 1 tablet by mouth three times a day as needed 10)  Cefdinir 300 Mg Caps (Cefdinir) .... 2 tabs by mouth daily for 7 days 11)  Hydrocodone-homatropine 5-1.5 Mg/66ml  Syrp (Hydrocodone-homatropine) .... 5ml by mouth at bedtime pran cough. 12)  Levaquin 750 Mg Tabs (Levofloxacin) .... Take 1 tablet by mouth once a day x 5 days

## 2010-06-03 NOTE — Assessment & Plan Note (Signed)
Summary: Pharyngitis   Vital Signs:  Patient profile:   76 year old female Height:      67.25 inches Weight:      197 pounds Temp:     98.4 degrees F oral Pulse rate:   73 / minute BP sitting:   113 / 72  (right arm) Cuff size:   regular  Vitals Entered By: Avon Gully CMA, Duncan Dull) (April 09, 2010 10:54 AM) CC: sore throat since thnxgiving   Primary Care Provider:  Nani Gasser MD  CC:  sore throat since thnxgiving.  History of Present Illness: ST on the right with some ear pressure on the right. NO HA or fever or GI sxs. Stared before thanksgiving (about 2 weeks ago). Mild runny nose but nothing worse than her baseline. Dry tickle cough in the back of her throat.  No known sick contacts.   Current Medications (verified): 1)  Enalapril Maleate 5 Mg Tabs (Enalapril Maleate) .... Take 1 Tablet By Mouth Once A Day 2)  Metoprolol Tartrate 25 Mg  Tabs (Metoprolol Tartrate) .... Take 1 Tablet By Mouth Two Times A Day 3)  Zocor 40 Mg  Tabs (Simvastatin) .... Take 1 Tablet By Mouth Once A Day At Bedtime 4)  Alprazolam 0.5 Mg  Tabs (Alprazolam) .... Take 1 Tablet By Mouth Two Times A Day As Needed 5)  Trazodone Hcl 50 Mg  Tabs (Trazodone Hcl) .... Take 1 Tablet By Mouth Once A Day At Bedtime 6)  Zoloft 100 Mg Tabs (Sertraline Hcl) .... 2 Tabs By Mouth Daily. 7)  Imodium A-D 2 Mg Tabs (Loperamide Hcl) .... As Needed 8)  Meprobamate 400 Mg Tabs (Meprobamate) .... Take 1 Tablet By Mouth Once A Day As Needed For Migraine. 9)  Levbid 0.375 Mg Xr12h-Tab (Hyoscyamine Sulfate) .... Take 1 Tablet By Mouth Two Times A Day 10)  Cyclobenzaprine Hcl 10 Mg Tabs (Cyclobenzaprine Hcl) .... Take 1 Tablet By Mouth Three Times A Day As Needed Muscle Pain  Allergies (verified): 1)  ! Keflex 2)  ! Sulfa 3)  ! * Insect Venom 4)  ! * Shellfish 5)  Abilify (Aripiprazole)  Comments:  Nurse/Medical Assistant: The patient's medications and allergies were reviewed with the patient and were  updated in the Medication and Allergy Lists. Avon Gully CMA, Duncan Dull) (April 09, 2010 10:55 AM)  Physical Exam  General:  Well-developed,well-nourished,in no acute distress; alert,appropriate and cooperative throughout examination Head:  Normocephalic and atraumatic without obvious abnormalities. No apparent alopecia or balding. Eyes:  No corneal or conjunctival inflammation noted. EOMI. Perrla.  Ears:  External ear exam shows no significant lesions or deformities.  Otoscopic examination reveals clear canals, tympanic membranes are intact bilaterally without bulging, retraction, inflammation or discharge. Hearing is grossly normal bilaterally. Nose:  External nasal examination shows no deformity or inflammation. Nasal mucosa are pink and moist without lesions or exudates. Mouth:  Oral mucosa and oropharynx without lesions or exudates. Some cobblestoning.  Neck:  No deformities, masses, or tenderness noted. Lungs:  Normal respiratory effort, chest expands symmetrically. Lungs are clear to auscultation, no crackles or wheezes. Heart:  Normal rate and regular rhythm. S1 and S2 normal without gallop, murmur, click, rub or other extra sounds. Skin:  no rashes.   Cervical Nodes:  No lymphadenopathy noted Psych:  Cognition and judgment appear intact. Alert and cooperative with normal attention span and concentration. No apparent delusions, illusions, hallucinations   Impression & Recommendations:  Problem # 1:  URI (ICD-465.9)  Strep is neg.  Likely viral. Can try an antihistamine to dry up the drinage. If cough doesn't resolve but ST improve consider an ACE cough.l Call if not better in one week.   Orders: Rapid Strep (16109)  Complete Medication List: 1)  Enalapril Maleate 5 Mg Tabs (Enalapril maleate) .... Take 1 tablet by mouth once a day 2)  Metoprolol Tartrate 25 Mg Tabs (Metoprolol tartrate) .... Take 1 tablet by mouth two times a day 3)  Zocor 40 Mg Tabs (Simvastatin) .... Take  1 tablet by mouth once a day at bedtime 4)  Alprazolam 0.5 Mg Tabs (Alprazolam) .... Take 1 tablet by mouth two times a day as needed 5)  Trazodone Hcl 50 Mg Tabs (Trazodone hcl) .... Take 1 tablet by mouth once a day at bedtime 6)  Zoloft 100 Mg Tabs (Sertraline hcl) .... 2 tabs by mouth daily. 7)  Imodium A-d 2 Mg Tabs (Loperamide hcl) .... As needed 8)  Meprobamate 400 Mg Tabs (Meprobamate) .... Take 1 tablet by mouth once a day as needed for migraine. 9)  Levbid 0.375 Mg Xr12h-tab (Hyoscyamine sulfate) .... Take 1 tablet by mouth two times a day 10)  Cyclobenzaprine Hcl 10 Mg Tabs (Cyclobenzaprine hcl) .... Take 1 tablet by mouth three times a day as needed muscle pain  Patient Instructions: 1)  Can try an antihistamine (like zyrtec or claritin) once  day. If not better in one week please call.    Orders Added: 1)  Est. Patient Level III [60454] 2)  Rapid Strep [09811]    Laboratory Results  Date/Time Received: 04/09/10 Date/Time Reported: 04/09/10  Other Tests  Rapid Strep: negative

## 2010-06-03 NOTE — Assessment & Plan Note (Signed)
Summary: FLU SHOT  Nurse Visit   Vitals Entered By: Kathlene November (February 02, 2008 10:52 AM)                 Prior Medications: HYDROXYZINE HCL 25 MG/ML SOLN (HYDROXYZINE HCL) one by mouth prn ENALAPRIL MALEATE 5 MG TABS (ENALAPRIL MALEATE) Take 1 tablet by mouth once a day LORATADINE 10 MG TABS (LORATADINE) one by mouth daily prn METOPROLOL TARTRATE 25 MG  TABS (METOPROLOL TARTRATE) Take 1 tablet by mouth two times a day ZOCOR 40 MG  TABS (SIMVASTATIN) Take 1 tablet by mouth once a day at bedtime MEPROBAMATE 400 MG TABS (MEPROBAMATE) one by mouth  2-3 times a day. ALPRAZOLAM 0.5 MG  TABS (ALPRAZOLAM) Take 1 tablet by mouth once a day TRAZODONE HCL 50 MG  TABS (TRAZODONE HCL) Take 1 tablet by mouth once a day at bedtime Current Allergies: ! KEFLEX ! SULFA ! * INSECT VENOM ! * SHELLFISH   Influenza Vaccine    Vaccine Type: Fluvax 3+    Site: left deltoid    Mfr: [employer]    Dose: 0.5 ml    Route: IM    Given by: Kathlene November    Exp. Date: 10/31/2008    Lot #: [id number]    VIS given: 11/25/06 version given February 02, 2008.  Flu Vaccine Consent Questions    Do you have a history of severe allergic reactions to this vaccine? no    Any prior history of allergic reactions to egg and/or gelatin? no    Do you have a sensitivity to the preservative Thimersol? no    Do you have a past history of Guillan-Barre Syndrome? no    Do you currently have an acute febrile illness? no    Have you ever had a severe reaction to latex? no    Vaccine information given and explained to patient? yes    Are you currently pregnant? no   Orders Added: 1)  Flu Vaccine 14yrs + [90658] 2)  Admin 1st Vaccine [email or web address]    ]

## 2010-06-03 NOTE — Progress Notes (Signed)
Summary: Effects from Zoloft  Phone Note Call from Patient Call back at Home Phone [phone]   Caller: Patient Call For: Nani Gasser MD Summary of Call: Not on Effexor now completely on the Zoloft and in early afternoon gets feeling of agitation and confusion. Please advise Initial call taken by: Kathlene November,  July 12, 2009 10:19 AM  Follow-up for Phone Call        Have her back down to 1/2 a tab again and then f/u next week.  Follow-up by: Nani Gasser MD,  July 12, 2009 12:03 PM  Additional Follow-up for Phone Call Additional follow up Details #1::        Pt informed of above instructions  Additional Follow-up by: Kathlene November,  July 12, 2009 12:05 PM

## 2010-06-03 NOTE — Assessment & Plan Note (Signed)
Summary: Fever, HA,SOB, Nausea x today rm 3   Vital Signs:  Patient Profile:   76 Years Old Female CC:      Fever, Nausea, SOB, headache x today Height:     67.25 inches Weight:      202 pounds O2 Sat:      92 % O2 treatment:    Room Air Temp:     100.4 degrees F oral Pulse rate:   140 / minute Pulse rhythm:   regular Resp:     15 per minute BP sitting:   119 / 67  (right arm) Cuff size:   regular  Vitals Entered By: Areta Haber CMA (November 20, 2009 5:19 PM)                  Current Allergies: ! KEFLEX ! SULFA ! * INSECT VENOM ! * SHELLFISH     History of Present Illness Chief Complaint: Fever, Nausea, SOB, headache x today History of Present Illness:  Subjective:  Patient complains of onset of malaise and fatigue this afternoon, followed by chills and nausea.  She has no other specific symptoms.  She states that in the past she has had similar symptoms with the onset of a UTI.  She denies dysuria/frequency/hesitancy etc.  No respiratory symptoms.  No changes in bowel movements.  No abdominal or back pain.  Current Problems: UTI (ICD-599.0) ABDOMINAL PAIN, LEFT LOWER QUADRANT (ICD-789.04) UNSPECIFIED HEARING LOSS (ICD-389.9) LUMBAR SPRAIN AND STRAIN (ICD-847.2) BACK STRAIN, THORACIC (ICD-847.1) NEVUS, ATYPICAL (ICD-216.9) GASTRIC ULCER (ICD-531.90) RHINITIS, ALLERGIC NOS (ICD-477.9) PANIC ATTACKS (ICD-300.01) OSTEOPENIA (ICD-733.90) MIGRAINE, UNSPEC., W/O INTRACTABLE MIGRAINE (ICD-346.90) HYPERTENSION, BENIGN SYSTEMIC (ICD-401.1) HYPERCHOLESTEROLEMIA (ICD-272.0) DERMATITIS, PERIORAL (ICD-695.3)   Current Meds ENALAPRIL MALEATE 5 MG TABS (ENALAPRIL MALEATE) Take 1 tablet by mouth once a day METOPROLOL TARTRATE 25 MG  TABS (METOPROLOL TARTRATE) Take 1 tablet by mouth two times a day ZOCOR 40 MG  TABS (SIMVASTATIN) Take 1 tablet by mouth once a day at bedtime ALPRAZOLAM 0.5 MG  TABS (ALPRAZOLAM) Take 1 tablet by mouth two times a day as needed TRAZODONE  HCL 50 MG  TABS (TRAZODONE HCL) Take 1 tablet by mouth once a day at bedtime FLEXERIL 10 MG TABS (CYCLOBENZAPRINE HCL) Take 1 tablet by mouth tid prn muscle spasm. ZOLOFT 100 MG TABS (SERTRALINE HCL) 2 tabs by mouth daily. IMODIUM A-D 2 MG TABS (LOPERAMIDE HCL) as needed METRONIDAZOLE 500 MG  TABS (METRONIDAZOLE) si 1 by mouth twice a day do not  take w/etoh PROMETHAZINE HCL 25 MG  TABS (PROMETHAZINE HCL) sig 1 by mouth q 6-8hrs prn CIPROFLOXACIN HCL 500 MG TABS (CIPROFLOXACIN HCL) 1 by mouth q12hr ZOFRAN 4 MG TABS (ONDANSETRON HCL) One or two tabs by mouth two times a day as needed for nausea  REVIEW OF SYSTEMS Constitutional Symptoms       Complains of fever, chills, and fatigue.     Denies night sweats, weight loss, and weight gain.  Eyes       Denies change in vision, eye pain, eye discharge, glasses, contact lenses, and eye surgery. Ear/Nose/Throat/Mouth       Denies hearing loss/aids, change in hearing, ear pain, ear discharge, dizziness, frequent runny nose, frequent nose bleeds, sinus problems, sore throat, hoarseness, and tooth pain or bleeding.  Respiratory       Complains of shortness of breath.      Denies dry cough, productive cough, wheezing, asthma, bronchitis, and emphysema/COPD.  Cardiovascular       Denies murmurs, chest  pain, and tires easily with exhertion.    Gastrointestinal       Complains of nausea/vomiting.      Denies stomach pain, diarrhea, constipation, blood in bowel movements, and indigestion.      Comments: x today Genitourniary       Denies painful urination, kidney stones, and loss of urinary control. Neurological       Complains of headaches.      Denies paralysis, seizures, and fainting/blackouts. Musculoskeletal       Denies muscle pain, joint pain, joint stiffness, decreased range of motion, redness, swelling, muscle weakness, and gout.  Skin       Denies bruising, unusual mles/lumps or sores, and hair/skin or nail changes.  Psych       Denies mood  changes, temper/anger issues, anxiety/stress, speech problems, depression, and sleep problems. Other Comments: Pt states she does have a Hx UTI.   Past History:  Past Medical History: Last updated: 02/16/2006 Hx of Depression  Hx of GI ulcer , Hx Seasonal Allergies  Meds: Meprobamate 400mg  BID #80/mo  Past Surgical History: Last updated: 10/09/2009 Cataract surgery Bilat - 2004  RUQ U/S 10/06- negative  Family History: Last updated: 02/16/2006 Brother-HTN  Father - DM, HTN, stroke  Mother - Depression  Social History: Last updated: 10/09/2009 Married to [place] with 2 children ages 63, 8.  LMP greater than 10 yrs ago.   Quit smoking 1997.   3# EtOH per week  Walks 20 min per day.  No longer sexually active.  Risk Factors: Smoking Status: quit (02/16/2006)   Objective:  No acute distress, alert and oriented  Skin:  Warm and dry Eyes:  Pupils are equal, round, and reactive to light and accomdation.  Extraocular movement is intact.  Conjunctivae are not inflamed.  Nose:  No sinus tenderness Mouth:  moist mucous membranes  Neck:  Supple.  No adenopathy is present.  No thyromegaly is present  Lungs:  Clear to auscultation.  Breath sounds are equal.  Heart:  Regular rate and rhythm without murmurs, rubs, or gallops.  Abdomen:  Vague minimal tenderness in the left lower quadrant without masses or hepatosplenomegaly.  Bowel sounds are present.  No CVA or flank tenderness.  urinalysis (dipstick): mod leuks, mod blood.  Micro:  WBC's TNTC CBC:  WBC 3.9, Hgb 12.2 Lungs:  Clear to auscultation.  Breath sounds are equal.  Assessment New Problems: UTI (ICD-599.0)   Plan New Medications/Changes: ZOFRAN 4 MG TABS (ONDANSETRON HCL) One or two tabs by mouth two times a day as needed for nausea  #Ten (10) x 0, 11/20/2009, Donna Christen MD CIPROFLOXACIN HCL 500 MG TABS (CIPROFLOXACIN HCL) 1 by mouth q12hr  #20 x 0, 11/20/2009, Donna Christen MD  New Orders: Zofran 1mg . injection  [J2405] Est. Patient Level III [16109] Urinalysis [81003-65000] CBC w/Diff [60454-09811] T-Culture, Urine [[id number]] Admin of Therapeutic Inj  intramuscular or subcutaneous [96372] Planning Comments:   Zofran 4mg  IM; Rx for Zofran by mouth  Urine culture pending.  Begin Cipro 500mg  two times a day for 10 days.  Continue increased fluids. Follow-up with PCP If symptoms become significantly worse during the night or over the weekend, proceed to the local emergency room.   The patient and/or caregiver has been counseled thoroughly with regard to medications prescribed including dosage, schedule, interactions, rationale for use, and possible side effects and they verbalize understanding.  Diagnoses and expected course of recovery discussed and will return if not improved as expected or if the condition worsens.  Patient and/or caregiver verbalized understanding.  Prescriptions: ZOFRAN 4 MG TABS (ONDANSETRON HCL) One or two tabs by mouth two times a day as needed for nausea  #Ten (10) x 0   Entered and Authorized by:   Donna Christen MD   Signed by:   Donna Christen MD on 11/20/2009   Method used:   Print then Give to Patient   RxID:   [id number] CIPROFLOXACIN HCL 500 MG TABS (CIPROFLOXACIN HCL) 1 by mouth q12hr  #20 x 0   Entered and Authorized by:   Donna Christen MD   Signed by:   Donna Christen MD on 11/20/2009   Method used:   Print then Give to Patient   RxID:   [id number]   Patient Instructions: 1)  Drink plenty of clear liquids today. 2)  Follow-up with your family doctor in 7 to 10 days for a repeat urinalysis 3)  If symptoms become significantly worse during the night or over the weekend, proceed to the local emergency room.   Medication Administration  Injection # 1:    Medication: Zofran 1mg . injection    Diagnosis: UTI (ICD-599.0)    Route: IM    Site: RUOQ gluteus    Exp Date: 07/03/2011    Lot #: [id number]    Mfr: Hospira    Comments: Administered 4mg      Patient tolerated injection without complications    Given by: Areta Haber CMA (November 20, 2009 6:17 PM)  Orders Added: 1)  Zofran 1mg . injection [J2405] 2)  Est. Patient Level III [25366] 3)  Urinalysis [81003-65000] 4)  CBC w/Diff [44034-74259] 5)  T-Culture, Urine [56387-56433] 6)  Admin of Therapeutic Inj  intramuscular or subcutaneous [96372]  Laboratory Results   Urine Tests  Date/Time Received: November 20, 2009 5:27 PM  Date/Time Reported: November 20, 2009 5:27 PM   Routine Urinalysis   Color: straw Appearance: Hazy Glucose: negative   (Normal Range: Negative) Bilirubin: negative   (Normal Range: Negative) Ketone: negative   (Normal Range: Negative) Spec. Gravity: 1.010   (Normal Range: 1.003-1.035) Blood: moderate   (Normal Range: Negative) pH: 5.5   (Normal Range: 5.0-8.0) Protein: 100   (Normal Range: Negative) Urobilinogen: 0.2   (Normal Range: 0-1) Nitrite: negative   (Normal Range: Negative) Leukocyte Esterace: moderate   (Normal Range: Negative)         Medication Administration  Injection # 1:    Medication: Zofran 1mg . injection    Diagnosis: UTI (ICD-599.0)    Route: IM    Site: RUOQ gluteus    Exp Date: 07/03/2011    Lot #: [id number]    Mfr: Hospira    Comments: Administered 4mg     Patient tolerated injection without complications    Given by: Areta Haber CMA (November 20, 2009 6:17 PM)  Orders Added: 1)  Zofran 1mg . injection [J2405] 2)  Est. Patient Level III [16606] 3)  Urinalysis [81003-65000] 4)  CBC w/Diff [30160-10932] 5)  T-Culture, Urine [35573-22025] 6)  Admin of Therapeutic Inj  intramuscular or subcutaneous [42706]

## 2010-06-03 NOTE — Progress Notes (Signed)
  Phone Note Call from Patient Call back at Home Phone [phone]   Caller: Patient Call For: Rhyan Radler Summary of Call: Pt needs 90 day Rx's written for mail order. Metoprolol, Enalapril and Hydroxisine Initial call taken by: Kathlene November,  May 10, 2007 11:41 AM      Prescriptions: HYDROXYZINE HCL 25 MG/ML SOLN (HYDROXYZINE HCL) one by mouth prn  #90 x 1   Entered and Authorized by:   Seymour Bars DO   Signed by:   Seymour Bars DO on 05/10/2007   Method used:   Print then Give to Patient   RxID:   [id number] METOPROLOL TARTRATE 50 MG TABS (METOPROLOL TARTRATE) one by mouth twice daily  #180 x 2   Entered and Authorized by:   Seymour Bars DO   Signed by:   Seymour Bars DO on 05/10/2007   Method used:   Print then Give to Patient   RxID:   [id number] ENALAPRIL MALEATE 5 MG TABS (ENALAPRIL MALEATE) Take 1 tablet by mouth once a day  #90 x 2   Entered and Authorized by:   Seymour Bars DO   Signed by:   Seymour Bars DO on 05/10/2007   Method used:   Print then Give to Patient   RxID:   [id number]

## 2010-06-03 NOTE — Assessment & Plan Note (Signed)
Summary: DISCUSS LABS   Vital Signs:  Patient Profile:   76 Years Old Female Height:     67.25 inches Weight:      193 pounds Pulse rate:   64 / minute BP sitting:   116 / 65  (left arm) Cuff size:   large  Vitals Entered By: Kathlene November (August 12, 2006 1:04 PM)               PCP:  Cipriano Bunker  Chief Complaint:  discuss lab results. Has increased frequency of migraines.  History of Present Illness: Had labs done in Novermber for pre-diabetic study.  Here to Review labs.    Migraines are more frequent.  Last on Friday, then Monday, then Tuesday. Had typical visual aura.  Usually takes Tylenol and meprobramate which does work well, but worried because of increased freq.  Still doesn't feel right.  Feels vsion is still not clear. Several stressors in life over last 2 weeks.  Husband going in for knee replacment next week.  Has been eating more nuts and omega-3.  No melatonin in last week.  Is having allergy symptoms and using Claritin and occ Benadry.  Also Had oral surgery on right and left in last 2 weeks. Has lost 7 lbs since Feb.  Has also slowy been reducing Effexor to 100mg .  Missed a couple of doses and felt very jittry.   May call about Effexor to 100mg  tabs.    Prior Medications: HYDROXYZINE HCL 25 MG/ML SOLN (HYDROXYZINE HCL) one by mouth prn BUSPAR 15 MG TABS (BUSPIRONE HCL) () Take 1 tablet by mouth once a day ENALAPRIL MALEATE 5 MG TABS (ENALAPRIL MALEATE) Take 1 tablet by mouth once a day LORATADINE 10 MG TABS (LORATADINE) one by mouth daily prn METOPROLOL TARTRATE 50 MG TABS (METOPROLOL TARTRATE) one by mouth twice daily ZOCOR 20 MG TABS (SIMVASTATIN) Take 1 tablet by mouth once a day MEPROBAMATE 400 MG TABS (MEPROBAMATE) one by mouth  2-3 times a day. EFFEXOR 75 MG TABS (VENLAFAXINE HCL) Take 1 tablet by mouth two times a day Current Allergies: ! KEFLEX ! SULFA ! * INSECT VENOM ! * SHELLFISH      Physical Exam  General:  Well-developed,well-nourished,in no acute distress; alert,appropriate and cooperative throughout examination Head:     Normocephalic and atraumatic without obvious abnormalities. No apparent alopecia or balding. Lungs:     Normal respiratory effort, chest expands symmetrically. Lungs are clear to auscultation, no crackles or wheezes. Heart:     Normal rate and regular rhythm. S1 and S2 normal without gallop, murmur, click, rub or other extra sounds. Pulses:     Radial pulses 2+ No carotid bruits.  Psych:     Cognition and judgment appear intact. Alert and cooperative with normal attention span and concentration. No apparent delusions, illusions, hallucinations    Impression & Recommendations:  Problem # 1:  HYPERCHOLESTEROLEMIA (ICD-272.0) Continue current dose Zocor, expect with her improvement in diet and weight that she will be at goal when has repeat Labs via study in May.  Her updated medication list for this problem includes:    Zocor 20 Mg Tabs (Simvastatin) .Marland Kitchen... Take 1 tablet by mouth once a day  Labs Reviewed: Chol: 201 (03/18/2006)   HDL: 59 (03/18/2006)   LDL: 111 (03/18/2006)   TG: 153 (03/18/2006)  Prior 10 Yr Risk Heart Disease: Not enough information (06/15/2006)   Problem # 2:  MIGRAINE, UNSPEC., W/O INTRACTABLE MIGRAINE (ICD-346.90) Discussed multiple stressors that may be causing  increased frequency. Do not recommend any change in regimen for now. Call if persistant increased freq.  Her updated medication list for this problem includes:    Metoprolol Tartrate 50 Mg Tabs (Metoprolol tartrate) ..... One by mouth twice daily

## 2010-06-03 NOTE — Procedures (Signed)
Summary: COLONOSCOPY   Colonoscopy  Procedure date:  09/09/2007  Findings:      Location:  [facility].    Procedures Next Due Date:    Colonoscopy: 09/2017  Patient Name: Dawn Bailey, Dawn Bailey MRN:  Procedure Procedures: Colonoscopy CPT: 16109.  Personnel: Endoscopist: Barbette Hair. Arlyce Dice, MD.  Exam Location: Outpatient  Patient Consent: Procedure, Alternatives, Risks and Benefits discussed, consent obtained, from patient.  Indications  Average Risk Screening Routine.  History  Current Medications: Patient is not currently taking Coumadin.  Pre-Exam Physical: Performed Sep 09, 2007. Cardio-pulmonary exam, HEENT exam , Abdominal exam, Mental status exam WNL.  Comments: Patient history reviewed/updated, physical performed prior to initiation of sedation?yes Exam Exam: Extent of exam reached: Cecum, extent intended: Cecum.  The cecum was identified by appendiceal orifice and IC valve. Time to Cecum: 00:09: 18. Time for Withdrawl: 00:05:55. Colon retroflexion performed. ASA Classification: I. Tolerance: good.  Monitoring: Pulse and BP monitoring, Oximetry used. Supplemental O2 given. at 2 Liters.  Colon Prep Used Miralax for colon prep. Prep results: good.  Sedation Meds: Patient assessed and found to be appropriate for moderate (conscious) sedation. Sedation was managed by the Endoscopist. Fentanyl 100 mcg. given IV. Versed 10 mg. given IV. Benadryl 25 given IV.  Findings - NORMAL EXAM: Cecum to Sigmoid Colon.  - NORMAL EXAM: Sigmoid Colon to Rectum.  - DIVERTICULOSIS: Sigmoid Colon. ICD9: Diverticulosis: 562.10. Comments: Few diverticula.  HEMORRHOIDS: Internal. ICD9: Hemorrhoids, Internal: 455.0.   Assessment Abnormal examination, see findings above.  Diagnoses: 562.10: Diverticulosis.  455.0: Hemorrhoids, Internal.   Events  Unplanned Interventions: No intervention was required.  Unplanned Events: There were no complications. Plans  Post  Exam Instructions: Post sedation instructions given.  Patient Education: Patient given standard instructions for: Diverticulosis. Hemorrhoids.  Disposition: After procedure patient sent to recovery. After recovery patient sent home.  Scheduling/Referral: Colonoscopy, to Barbette Hair. Arlyce Dice, MD, around Sep 08, 2017.       This report was created from the original endoscopy report, which was reviewed and signed the above listed endoscopist.

## 2010-06-03 NOTE — Letter (Signed)
Summary: HELP Prevent Diabetes  HELP Prevent Diabetes   Imported By: Maryln Gottron 09/28/2008 14:18:58  _____________________________________________________________________  External Attachment:    Type:   Image     Comment:   External Document

## 2010-06-03 NOTE — Progress Notes (Signed)
Summary: Still hving pelvic pain  Phone Note Call from Patient Call back at Home Phone [phone]   Caller: Patient Call For: Nani Gasser MD Summary of Call: Pt calls and states her lower abd. pain no better and said you had mentioned an ultrasound or sonogram if no better. Please advise Initial call taken by: Kathlene November,  January 26, 2008 4:24 PM  Follow-up for Phone Call        OK will set up for Pelvic US if not resolving.  Follow-up by: Nani Gasser MD,  January 26, 2008 4:27 PM

## 2010-06-03 NOTE — Assessment & Plan Note (Signed)
Summary: ibs...as.   History of Present Illness Visit Type: consult  Primary GI MD: Melvia Heaps MD [facility] Primary Provider: Nani Gasser MD Requesting Provider: Nani Gasser MD Chief Complaint: IBS. Pt c/o lower abd pain, nausea, black tarry stools, change in bowel habits, diarrhea, fecal incontinence, and hemorrhoids History of Present Illness:   Dawn Bailey is a 76 year old white female referred to the request of Dr. Linford Arnold for evaluation of diarrhea.  Approximately twice a year she has episodes of severe diarrhea accompanied by severe crampy pain and incontinence.  This usually subsides in one to 2 days.  Her most recent episode has lasted over 2 weeks and is characterized  by more severe pain than usual.  There is no history of melena or hematochezia.  Certain foods  triggeres diarrhea including vegetables, which she avoids.   Despite limiting her diet symptoms have recently been prolonged.  She has a history of IBS.  Symptoms precede any change in her medications.  Colonoscopy in 2009 demonstrated diverticulosis.  She is without fever.  Pain usually precedes and may follow a bowel movement.   GI Review of Systems    Reports abdominal pain and  nausea.     Location of  Abdominal pain: lower abdomen.    Denies acid reflux, belching, bloating, chest pain, dysphagia with liquids, dysphagia with solids, heartburn, loss of appetite, vomiting, vomiting blood, weight loss, and  weight gain.      Reports black tarry stools, change in bowel habits, diarrhea, fecal incontinence, hemorrhoids, and  irritable bowel syndrome.     Denies anal fissure, constipation, diverticulosis, heme positive stool, jaundice, light color stool, liver problems, rectal bleeding, and  rectal pain.    Current Medications (verified): 1)  Enalapril Maleate 5 Mg Tabs (Enalapril Maleate) .... Take 1 Tablet By Mouth Once A Day 2)  Metoprolol Tartrate 25 Mg  Tabs (Metoprolol Tartrate) .... Take 1 Tablet By Mouth Two  Times A Day 3)  Zocor 40 Mg  Tabs (Simvastatin) .... Take 1 Tablet By Mouth Once A Day At Bedtime 4)  Alprazolam 0.5 Mg  Tabs (Alprazolam) .... Take 1 Tablet By Mouth Two Times A Day As Needed 5)  Trazodone Hcl 50 Mg  Tabs (Trazodone Hcl) .... Take 1 Tablet By Mouth Once A Day At Bedtime 6)  Zoloft 100 Mg Tabs (Sertraline Hcl) .... 2 Tabs By Mouth Daily. 7)  Imodium A-D 2 Mg Tabs (Loperamide Hcl) .... As Needed 8)  Meprobamate 400 Mg Tabs (Meprobamate) .... Take 1 Tablet By Mouth Once A Day As Needed For Migraine. 9)  Levbid 0.375 Mg Xr12h-Tab (Hyoscyamine Sulfate) .... Take 1 Tablet By Mouth Two Times A Day  Allergies (verified): 1)  ! Keflex 2)  ! Sulfa 3)  ! * Insect Venom 4)  ! * Shellfish 5)  Abilify (Aripiprazole)  Past History:  Past Medical History: Hx of Depression  Hx Seasonal Allergies IBS (ICD-564.1) UNSPECIFIED HEARING LOSS (ICD-389.9) LUMBAR SPRAIN AND STRAIN (ICD-847.2) BACK STRAIN, THORACIC (ICD-847.1) NEVUS, ATYPICAL (ICD-216.9) GASTRIC ULCER (ICD-531.90) RHINITIS, ALLERGIC NOS (ICD-477.9) PANIC ATTACKS (ICD-300.01) OSTEOPENIA (ICD-733.90) MIGRAINE, UNSPEC., W/O INTRACTABLE MIGRAINE (ICD-346.90) HYPERTENSION, BENIGN SYSTEMIC (ICD-401.1) HYPERCHOLESTEROLEMIA (ICD-272.0) Obesity Urinary Tract Infection Hx of Anemia Diverticulosis-Colonoscopy 2009 Hemorrhoids  Past Surgical History: Reviewed history from 10/09/2009 and no changes required. Cataract surgery Bilat - 2004  RUQ U/S 10/06- negative  Family History: Brother-HTN  Father - DM, HTN, stroke  Mother - Depression No FH of Colon Cancer:  Social History: Married to [place] with 2 children  ages 25, 69.  LMP greater than 10 yrs ago.   Quit smoking 1997.   3# EtOH per week  Walks 20 min per day.  No longer sexually active. Daily Caffeine Use: one daily   Review of Systems       The patient complains of allergy/sinus, anxiety-new, arthritis/joint pain, back pain, depression-new, fatigue, fever,  headaches-new, itching, muscle pains/cramps, skin rash, sleeping problems, and urination - excessive.  The patient denies anemia, blood in urine, breast changes/lumps, change in vision, confusion, cough, coughing up blood, fainting, hearing problems, heart murmur, heart rhythm changes, menstrual pain, night sweats, nosebleeds, pregnancy symptoms, shortness of breath, sore throat, swelling of feet/legs, swollen lymph glands, thirst - excessive , urination - excessive , urination changes/pain, urine leakage, vision changes, and voice change.         All other systems were reviewed and were negative   Vital Signs:  Patient profile:   76 year old female Height:      67.25 inches Weight:      204 pounds BMI:     31.83 BSA:     2.05 Pulse rate:   76 / minute Pulse rhythm:   regular BP sitting:   132 / 74  (left arm) Cuff size:   regular  Vitals Entered By: Ok Anis CMA (February 24, 2010 9:13 AM)  Physical Exam  Additional Exam:  On physical exam she is a well-developed large female  skin: anicteric HEENT: normocephalic; PEERLA; no nasal or pharyngeal abnormalities neck: supple nodes: no cervical lymphadenopathy chest: clear to ausculatation and percussion heart: no murmurs, gallops, or rubs abd: soft, nontender; BS normoactive; no abdominal masses, tenderness, organomegaly rectal: deferred ext: no cynanosis, clubbing, edema skeletal: no deformities neuro: oriented x 3; no focal abnormalities    Impression & Recommendations:  Problem # 1:  IBS (ICD-564.1) Assessment Deteriorated  Symptoms are very suggestive of IBS.  Microscopic colitis is another consideration.  Although she has diverticulosis I do not think that she has recently had diverticulitis.  Recommendations #1 substitute Pepto-Bismol for Imodium; hyoscyamine p.r.n. #2 flexible sigmoidoscopy for biopsies to rule out microscopic colitis  Orders: Flex with Sedation (Flex w/Sed)  Problem # 2:  GASTRIC ULCER  (ICD-531.90) History of gastric ulcer with bleeding.  Patient Instructions: 1)  Copy sent to : Nani Gasser MD 2)  Conscious Sedation brochure given. Your Flexible Sigmoidoscopy is scheduled on 03/31/2010 at 2pm 3)  The medication list was reviewed and reconciled.  All changed / newly prescribed medications were explained.  A complete medication list was provided to the patient / caregiver.

## 2010-06-03 NOTE — Assessment & Plan Note (Signed)
Summary: Acute lumbar back pain   Vital Signs:  Patient profile:   76 year old female Height:      67.25 inches Weight:      199 pounds Pulse rate:   69 / minute BP sitting:   117 / 73  (left arm) Cuff size:   regular  Vitals Entered By: Kathlene November (November 28, 2008 1:16 PM) CC: housecleaning yesterday and twisted and has low back pain, Hypertension Management Pain Assessment Patient in pain? yes     Location: back Intensity: 9 Type: sharp Onset of pain  yesterday   Primary Care Provider:  Linford Arnold, C  CC:  housecleaning yesterday and twisted and has low back pain and Hypertension Management.  History of Present Illness: housecleaning yesterday and twisted and has low back pain.  Was bent over and turned adn then it was suddenly painful.  Took Tylenol and no relief.  Worse if she turns. Better after lays down for awhile and can seem to get her back to relax but as soon as she starts moving agan it is painful again.  Radiates into the left buttock bu tnot into legs. No leg weakness.  . Had some upper back pain in March but this is different.   Started Vitamin D since she was low. Brought in a copy of her labs and her DEXA with her.    Hypertension History:      Positive major cardiovascular risk factors include female age 42 years old or older, hyperlipidemia, and hypertension.  Negative major cardiovascular risk factors include no history of diabetes, negative family history for ischemic heart disease, and non-tobacco-user status.        Further assessment for target organ damage reveals no history of ASHD, cardiac end-organ damage (CHF/LVH), stroke/TIA, peripheral vascular disease, renal insufficiency, or hypertensive retinopathy.    Current Medications (verified): 1)  Enalapril Maleate 5 Mg Tabs (Enalapril Maleate) .... Take 1 Tablet By Mouth Once A Day 2)  Loratadine 10 Mg Tabs (Loratadine) .... One By Mouth Daily Prn 3)  Metoprolol Tartrate 25 Mg  Tabs (Metoprolol Tartrate)  .... Take 1 Tablet By Mouth Two Times A Day 4)  Zocor 40 Mg  Tabs (Simvastatin) .... Take 1 Tablet By Mouth Once A Day At Bedtime 5)  Alprazolam 0.5 Mg  Tabs (Alprazolam) .... Take 1 Tablet By Mouth Once A Day 6)  Trazodone Hcl 50 Mg  Tabs (Trazodone Hcl) .... Take 1 Tablet By Mouth Once A Day At Bedtime 7)  Flexeril 10 Mg Tabs (Cyclobenzaprine Hcl) .... Take 1 Tablet By Mouth Once A Day At Bedtime As Needed Muscle Spasm. 8)  Venlafaxine Hcl 100 Mg Tabs (Venlafaxine Hcl) .... Take 1 Tablet By Mouth Two Times A Day 9)  Meprobamate 400 Mg Tabs (Meprobamate) .... Take 1 Tablet By Mouth Once A Day As Needed Migraine.  Allergies (verified): 1)  ! Keflex 2)  ! Sulfa 3)  ! * Insect Venom 4)  ! * Shellfish  Comments:  Nurse/Medical Assistant: The patient's medications and allergies were reviewed with the patient and were updated in the Medication and Allergy Lists. Kathlene November (November 28, 2008 1:17 PM)  Past History:  Past Medical History: Last updated: 02/16/2006 Hx of Depression  Hx of GI ulcer , Hx Seasonal Allergies  Meds: Meprobamate 400mg  BID #80/mo  Social History: Last updated: 02/09/2006 Married to Cameron with 2 children ages 16, 84.  LMP greater than 10 yrs ago.  Quit smoking 1997.  3# EtOH per week.  2 caffeinated drinks per day.  Walks 20 min per day.  No longer sexually active.  Physical Exam  General:  Well-developed,well-nourished,in no acute distress; alert,appropriate and cooperative throughout examination Head:  Normocephalic and atraumatic without obvious abnormalities. No apparent alopecia or balding. Msk:  Very decreased flexion and extension. No rotation right or left with spasm. Tender over the left lumbar paraspinour muscles. Tenderness into the left buttock Pain in her back with straight leg raise on teh left but no pain radiating into her legs. Hip, knee, and ankle strength is 5/5.   Skin:  no rashes.   Psych:  Cognition and judgment appear intact. Alert and  cooperative with normal attention span and concentration. No apparent delusions, illusions, hallucinations   Impression & Recommendations:  Problem # 1:  LUMBAR SPRAIN AND STRAIN (ICD-847.2) Assessment New  Mostly muscle spasm. May have slight herniation of disc since pain does radiate into buttock.  Will treat with muscle relaxer, hydrocodone (since intolerant to NSAID wiht bleeding ulcer hx) and gentlte stretches were reviewed. Can switch back to Tylenol in a couple of days. Offered PT up front. She declined. Call if not getting better and will set up with PT.  Toradol given today for acute pain relief. Avoid laying in bed too long.   Orders: Ketorolac-Toradol 15mg  (J4782)  Complete Medication List: 1)  Enalapril Maleate 5 Mg Tabs (Enalapril maleate) .... Take 1 tablet by mouth once a day 2)  Loratadine 10 Mg Tabs (Loratadine) .... One by mouth daily prn 3)  Metoprolol Tartrate 25 Mg Tabs (Metoprolol tartrate) .... Take 1 tablet by mouth two times a day 4)  Zocor 40 Mg Tabs (Simvastatin) .... Take 1 tablet by mouth once a day at bedtime 5)  Alprazolam 0.5 Mg Tabs (Alprazolam) .... Take 1 tablet by mouth once a day 6)  Trazodone Hcl 50 Mg Tabs (Trazodone hcl) .... Take 1 tablet by mouth once a day at bedtime 7)  Flexeril 10 Mg Tabs (Cyclobenzaprine hcl) .... Take 1 tablet by mouth tid prn muscle spasm. 8)  Venlafaxine Hcl 100 Mg Tabs (Venlafaxine hcl) .... Take 1 tablet by mouth two times a day 9)  Meprobamate 400 Mg Tabs (Meprobamate) .... Take 1 tablet by mouth once a day as needed migraine. 10)  Hydrocodone-acetaminophen 5-500 Mg Tabs (Hydrocodone-acetaminophen) .Marland Kitchen.. 1-2 tabs every 6 - 8 hours as needed severe pain  Hypertension Assessment/Plan:      The patient's hypertensive risk group is category B: At least one risk factor (excluding diabetes) with no target organ damage.  Her calculated 10 year risk of coronary heart disease is 3 %.  Today's blood pressure is 117/73.  Her blood  pressure goal is < 140/90.  Prescriptions: HYDROCODONE-ACETAMINOPHEN 5-500 MG TABS (HYDROCODONE-ACETAMINOPHEN) 1-2 tabs every 6 - 8 hours as needed severe pain  #40 x 0   Entered and Authorized by:   Nani Gasser MD   Signed by:   Nani Gasser MD on 11/28/2008   Method used:   Printed then faxed to ...       [facility] [phone]* (retail)       [address] [place], [place]  [address]       Ph: [phone]       Fax: [phone]   RxID:   [id number] FLEXERIL 10 MG TABS (CYCLOBENZAPRINE HCL) Take 1 tablet by mouth tid prn muscle spasm.  #45 x 0   Entered and Authorized by:  Nani Gasser MD   Signed by:   Nani Gasser MD on 11/28/2008   Method used:   Electronically to        [facility] [phone]* (retail)       [address] [place], [place]  [address]       Ph: [phone]       Fax: [phone]   RxID:   [id number]    Medication Administration  Injection # 1:    Medication: Ketorolac-Toradol 15mg     Diagnosis: BACK STRAIN, THORACIC (ICD-847.1)    Route: IM    Site: LUOQ gluteus    Exp Date: 06/04/2010    Lot #: [id number]    Mfr: novaplus    Comments: toradol 60mg  given    Patient tolerated injection without complications    Given by: Kathlene November (November 28, 2008 3:13 PM)  Orders Added: 1)  Ketorolac-Toradol 15mg  [J1885] 2)  Est. Patient Level IV [72536]

## 2010-06-03 NOTE — Letter (Signed)
Summary: [facility]   Imported By: Kathlene November 07/25/2007 10:29:41  _____________________________________________________________________  External Attachment:    Type:   Image     Comment:   External Document

## 2010-06-03 NOTE — Progress Notes (Signed)
Summary: AAllergy to antibiotic sent  Phone Note From Pharmacy   Caller: [facility]  [place] [phone]* Call For: Dawn Bailey  Summary of Call: Pharmacy calls and states they have her allergic to Cephalosporins and that is what you sent . need change of med Initial call taken by: Kathlene November,  May 06, 2009 12:36 PM  Follow-up for Phone Call        Called pt and she really just has an intolerance to keflex not a true allergy so OK to fill the [facility] and will also send over rx vor night time cough. Follow-up by: Nani Gasser MD,  May 06, 2009 12:44 PM  Additional Follow-up for Phone Call Additional follow up Details #1::        Called pharmacy and instructed ok to fill antibitic also called in the cough med. Pt notified Additional Follow-up by: Kathlene November,  May 06, 2009 12:51 PM    New/Updated Medications: HYDROCODONE-HOMATROPINE 5-1.5 MG/5ML SYRP (HYDROCODONE-HOMATROPINE) 5ml by mouth at bedtime pran cough. Prescriptions: HYDROCODONE-HOMATROPINE 5-1.5 MG/5ML SYRP (HYDROCODONE-HOMATROPINE) 5ml by mouth at bedtime pran cough.  #159ml x 0   Entered and Authorized by:   Nani Gasser MD   Signed by:   Nani Gasser MD on 05/06/2009   Method used:   Printed then faxed to ...       [facility] [phone]* (retail)       [address] [place], [place]  [address]       Ph: [phone]       Fax: [phone]   RxID:   [id number]

## 2010-06-03 NOTE — Assessment & Plan Note (Signed)
Summary: FU migraines   Vital Signs:  Patient profile:   76 year old female Height:      67.25 inches Weight:      195 pounds Pulse rate:   71 / minute BP sitting:   110 / 62  (left arm) Cuff size:   regular  Vitals Entered By: Kathlene November (Sep 27, 2008 10:03 AM) CC: follow up on  migraines   Primary Care Provider:  Linford Arnold, C  CC:  follow up on  migraines.  History of Present Illness: Seeing Bonita Quin for her migraines. Did trey some of the things that she had suggested.  Nothing works as well as the [employer].  Had HA for up to 3 days.  does have aura with her HA.  Has been taking the meprobromate daily and then up to 3x a day when has a HA>    Started a vitamin D study at [facility].  Had a bone density test as well and some labs.    Current Medications (verified): 1)  Hydroxyzine Hcl 25 Mg/ml Soln (Hydroxyzine Hcl) .... One By Mouth Prn 2)  Enalapril Maleate 5 Mg Tabs (Enalapril Maleate) .... Take 1 Tablet By Mouth Once A Day 3)  Loratadine 10 Mg Tabs (Loratadine) .... One By Mouth Daily Prn 4)  Metoprolol Tartrate 25 Mg  Tabs (Metoprolol Tartrate) .... Take 1 Tablet By Mouth Two Times A Day 5)  Zocor 40 Mg  Tabs (Simvastatin) .... Take 1 Tablet By Mouth Once A Day At Bedtime 6)  Alprazolam 0.5 Mg  Tabs (Alprazolam) .... Take 1 Tablet By Mouth Once A Day 7)  Trazodone Hcl 50 Mg  Tabs (Trazodone Hcl) .... Take 1 Tablet By Mouth Once A Day At Bedtime 8)  Flexeril 10 Mg Tabs (Cyclobenzaprine Hcl) .... Take 1 Tablet By Mouth Once A Day At Bedtime As Needed Muscle Spasm. 9)  Venlafaxine Hcl 100 Mg Tabs (Venlafaxine Hcl) .... Take 1 Tablet By Mouth Two Times A Day 10)  Meprobamate 400 Mg Tabs (Meprobamate) .... Take 1 Tablet By Mouth Once A Day As Needed Migraine.  Allergies (verified): 1)  ! Keflex 2)  ! Sulfa 3)  ! * Insect Venom 4)  ! * Shellfish  Comments:  Nurse/Medical Assistant: The patient's medications and allergies were reviewed with the patient and were updated  in the Medication and Allergy Lists. Kathlene November (Sep 27, 2008 10:04 AM)  Physical Exam  General:  Well-developed,well-nourished,in no acute distress; alert,appropriate and cooperative throughout examination Head:  Normocephalic and atraumatic without obvious abnormalities. No apparent alopecia or balding. Lungs:  Normal respiratory effort, chest expands symmetrically. Lungs are clear to auscultation, no crackles or wheezes. Heart:  Normal rate and regular rhythm. S1 and S2 normal without gallop, murmur, click, rub or other extra sounds.   Impression & Recommendations:  Problem # 1:  MIGRAINE, UNSPEC., W/O INTRACTABLE MIGRAINE (ICD-346.90) Discussed to only use the meprobraomate for migarine. No evidence that it works for daily use. Then 90 tabs should really last her almost 3 months and overall will be much safer. There is some concern with this product in the ederly but she is very acitive adn mobile and it seems to be one of the only regimens that works really well for her.  Just want to really minimize her use.   Would like a refill on hte flexeril as well.  Continue to keep a HA calendar to see how often using the medication. RF meprobroamate for 90 tabs.   Her  updated medication list for this problem includes:    Metoprolol Tartrate 25 Mg Tabs (Metoprolol tartrate) .Marland Kitchen... Take 1 tablet by mouth two times a day  Complete Medication List: 1)  Hydroxyzine Hcl 25 Mg/ml Soln (Hydroxyzine hcl) .... One by mouth prn 2)  Enalapril Maleate 5 Mg Tabs (Enalapril maleate) .... Take 1 tablet by mouth once a day 3)  Loratadine 10 Mg Tabs (Loratadine) .... One by mouth daily prn 4)  Metoprolol Tartrate 25 Mg Tabs (Metoprolol tartrate) .... Take 1 tablet by mouth two times a day 5)  Zocor 40 Mg Tabs (Simvastatin) .... Take 1 tablet by mouth once a day at bedtime 6)  Alprazolam 0.5 Mg Tabs (Alprazolam) .... Take 1 tablet by mouth once a day 7)  Trazodone Hcl 50 Mg Tabs (Trazodone hcl) .... Take 1 tablet  by mouth once a day at bedtime 8)  Flexeril 10 Mg Tabs (Cyclobenzaprine hcl) .... Take 1 tablet by mouth once a day at bedtime as needed muscle spasm. 9)  Venlafaxine Hcl 100 Mg Tabs (Venlafaxine hcl) .... Take 1 tablet by mouth two times a day 10)  Meprobamate 400 Mg Tabs (Meprobamate) .... Take 1 tablet by mouth once a day as needed migraine. Prescriptions: ZOCOR 40 MG  TABS (SIMVASTATIN) Take 1 tablet by mouth once a day at bedtime  #90 x 3   Entered and Authorized by:   Nani Gasser MD   Signed by:   Nani Gasser MD on 09/27/2008   Method used:   Print then Give to Patient   RxID:   [id number] ALPRAZOLAM 0.5 MG  TABS (ALPRAZOLAM) Take 1 tablet by mouth once a day  #90 x 1   Entered and Authorized by:   Nani Gasser MD   Signed by:   Nani Gasser MD on 09/27/2008   Method used:   Printed then faxed to ...       [address] [phone]* (retail)       [address] [place], [place]  [address]       Ph: [phone]       Fax: [phone]   RxID:   [id number] FLEXERIL 10 MG TABS (CYCLOBENZAPRINE HCL) Take 1 tablet by mouth once a day at bedtime as needed muscle spasm.  #30 x 1   Entered and Authorized by:   Nani Gasser MD   Signed by:   Nani Gasser MD on 09/27/2008   Method used:   Printed then faxed to ...       [address] [phone]* (retail)       [address] [place], [place]  [address]       Ph: [phone]       Fax: [phone]   RxID:   [id number] TRAZODONE HCL 50 MG  TABS (TRAZODONE HCL) Take 1 tablet by mouth once a day at bedtime  #90 x 1   Entered and Authorized by:   Nani Gasser MD   Signed by:   Nani Gasser MD on 09/27/2008   Method used:   Printed then faxed to ...       [address] [phone]* (retail)       [address] [place], [place]  [address]       Ph: [phone]       Fax: [phone]   RxID:   [id number] MEPROBAMATE 400 MG TABS (  MEPROBAMATE) Take 1 tablet by mouth  once a day as needed migraine.  #90 x 1   Entered and Authorized by:   Nani Gasser MD   Signed by:   Nani Gasser MD on 09/27/2008   Method used:   Printed then faxed to ...       [facility] [phone]* (retail)       [address] [place], [place]  [address]       Ph: [phone]       Fax: [phone]   RxID:   [id number]   Appended Document: FU migraines Says the flexeriland oxanax combo at onset of HA doesn't work nearly as well as her meprobraomate.

## 2010-06-03 NOTE — Assessment & Plan Note (Signed)
Summary: 2 mo.f/u  mood, HTN   Vital Signs:  Patient profile:   76 year old female Height:      67.25 inches Weight:      206 pounds Pulse rate:   69 / minute BP sitting:   128 / 77  (left arm) Cuff size:   regular  Vitals Entered By: Avon Gully CMA, (AAMA) (December 06, 2009 9:04 AM) CC: f/u BP, and f/u UTI, Hypertension Management   Primary Care Provider:  Nani Gasser MD  CC:  f/u BP, and f/u UTI, and Hypertension Management.  History of Present Illness: Was seen at [facility] twice in the last couple of months for diverticulitis and UTI. Says was very sick with the UTI. Given Cipro. Had muscle and joint pain with the ABX. Still gets tired very easily.  No pelvic pain or pressure. No fever.    Tried the Abilify for 5 days adn had horrible nightmares on it. The nightmares stopped aas soon as stopped the medication.    Hypertension History:      She denies headache, chest pain, palpitations, dyspnea with exertion, orthopnea, PND, peripheral edema, visual symptoms, neurologic problems, syncope, and side effects from treatment.  She notes no problems with any antihypertensive medication side effects.        Positive major cardiovascular risk factors include female age 5 years old or older, hyperlipidemia, and hypertension.  Negative major cardiovascular risk factors include no history of diabetes, negative family history for ischemic heart disease, and non-tobacco-user status.        Further assessment for target organ damage reveals no history of ASHD, cardiac end-organ damage (CHF/LVH), stroke/TIA, peripheral vascular disease, renal insufficiency, or hypertensive retinopathy.     Current Medications (verified): 1)  Enalapril Maleate 5 Mg Tabs (Enalapril Maleate) .... Take 1 Tablet By Mouth Once A Day 2)  Metoprolol Tartrate 25 Mg  Tabs (Metoprolol Tartrate) .... Take 1 Tablet By Mouth Two Times A Day 3)  Zocor 40 Mg  Tabs (Simvastatin) .... Take 1 Tablet By Mouth Once A Day At  Bedtime 4)  Alprazolam 0.5 Mg  Tabs (Alprazolam) .... Take 1 Tablet By Mouth Two Times A Day As Needed 5)  Trazodone Hcl 50 Mg  Tabs (Trazodone Hcl) .... Take 1 Tablet By Mouth Once A Day At Bedtime 6)  Flexeril 10 Mg Tabs (Cyclobenzaprine Hcl) .... Take 1 Tablet By Mouth Tid Prn Muscle Spasm. 7)  Zoloft 100 Mg Tabs (Sertraline Hcl) .... 2 Tabs By Mouth Daily. 8)  Imodium A-D 2 Mg Tabs (Loperamide Hcl) .... As Needed 9)  Promethazine Hcl 25 Mg  Tabs (Promethazine Hcl) .... Sig 1 By Mouth Q 6-8hrs Prn 10)  Ciprofloxacin Hcl 500 Mg Tabs (Ciprofloxacin Hcl) .Marland Kitchen.. 1 By Mouth Q12hr  Allergies (verified): 1)  ! Keflex 2)  ! Sulfa 3)  ! * Insect Venom 4)  ! * Shellfish 5)  Abilify (Aripiprazole)  Comments:  Nurse/Medical Assistant: The patient's medications and allergies were reviewed with the patient and were updated in the Medication and Allergy Lists. Avon Gully CMA, Duncan Dull) (December 06, 2009 9:06 AM)  Physical Exam  General:  Well-developed,well-nourished,in no acute distress; alert,appropriate and cooperative throughout examination Lungs:  Normal respiratory effort, chest expands symmetrically. Lungs are clear to auscultation, no crackles or wheezes. Heart:  Normal rate and regular rhythm. S1 and S2 normal without gallop, murmur, click, rub or other extra sounds.   Impression & Recommendations:  Problem # 1:  HYPERTENSION, BENIGN SYSTEMIC (ICD-401.1)  Looks great today.   Her updated medication list for this problem includes:    Enalapril Maleate 5 Mg Tabs (Enalapril maleate) .Marland Kitchen... Take 1 tablet by mouth once a day    Metoprolol Tartrate 25 Mg Tabs (Metoprolol tartrate) .Marland Kitchen... Take 1 tablet by mouth two times a day  Problem # 2:  UTI (ICD-599.0)  Recheck on UA is negative.  Expect her fatigue will improve over the next month.  The following medications were removed from the medication list:    Metronidazole 500 Mg Tabs (Metronidazole) ..... Si 1 by mouth twice a day do not   take w/etoh    Ciprofloxacin Hcl 500 Mg Tabs (Ciprofloxacin hcl) .Marland Kitchen... 1 by mouth q12hr  The following medications were removed from the medication list:    Metronidazole 500 Mg Tabs (Metronidazole) ..... Si 1 by mouth twice a day do not  take w/etoh Her updated medication list for this problem includes:    Ciprofloxacin Hcl 500 Mg Tabs (Ciprofloxacin hcl) .Marland Kitchen... 1 by mouth q12hr  Orders: UA Dipstick w/o Micro (automated)  (81003)  Problem # 3:  PANIC ATTACKS (ICD-300.01) Doibg well overall. Off the abilify.  Continue zoloft and xanax. Monitor for mood chages. Med refills sent.  Her updated medication list for this problem includes:    Alprazolam 0.5 Mg Tabs (Alprazolam) .Marland Kitchen... Take 1 tablet by mouth two times a day as needed    Trazodone Hcl 50 Mg Tabs (Trazodone hcl) .Marland Kitchen... Take 1 tablet by mouth once a day at bedtime    Zoloft 100 Mg Tabs (Sertraline hcl) .Marland Kitchen... 2 tabs by mouth daily.  Her updated medication list for this problem includes:    Alprazolam 0.5 Mg Tabs (Alprazolam) .Marland Kitchen... Take 1 tablet by mouth two times a day as needed    Trazodone Hcl 50 Mg Tabs (Trazodone hcl) .Marland Kitchen... Take 1 tablet by mouth once a day at bedtime    Zoloft 100 Mg Tabs (Sertraline hcl) .Marland Kitchen... 2 tabs by mouth daily.  Complete Medication List: 1)  Enalapril Maleate 5 Mg Tabs (Enalapril maleate) .... Take 1 tablet by mouth once a day 2)  Metoprolol Tartrate 25 Mg Tabs (Metoprolol tartrate) .... Take 1 tablet by mouth two times a day 3)  Zocor 40 Mg Tabs (Simvastatin) .... Take 1 tablet by mouth once a day at bedtime 4)  Alprazolam 0.5 Mg Tabs (Alprazolam) .... Take 1 tablet by mouth two times a day as needed 5)  Trazodone Hcl 50 Mg Tabs (Trazodone hcl) .... Take 1 tablet by mouth once a day at bedtime 6)  Flexeril 10 Mg Tabs (Cyclobenzaprine hcl) .... Take 1 tablet by mouth tid prn muscle spasm. 7)  Zoloft 100 Mg Tabs (Sertraline hcl) .... 2 tabs by mouth daily. 8)  Imodium A-d 2 Mg Tabs (Loperamide hcl) .... As  needed 9)  Promethazine Hcl 25 Mg Tabs (Promethazine hcl) .... Sig 1 by mouth q 6-8hrs prn  Hypertension Assessment/Plan:      The patient's hypertensive risk group is category B: At least one risk factor (excluding diabetes) with no target organ damage.  Her calculated 10 year risk of coronary heart disease is 5 %.  Today's blood pressure is 128/77.  Her blood pressure goal is < 140/90.  Patient Instructions: 1)  Please schedule a follow-up appointment in 4 months for mood.  Prescriptions: FLEXERIL 10 MG TABS (CYCLOBENZAPRINE HCL) Take 1 tablet by mouth tid prn muscle spasm.  #30 Each x 0   Entered and Authorized by:  Nani Gasser MD   Signed by:   Nani Gasser MD on 12/06/2009   Method used:   Printed then faxed to ...       [address] [phone]* (retail)       [address] [place], [place]  [address]       Ph: [phone]       Fax: [phone]   RxID:   [id number] TRAZODONE HCL 50 MG  TABS (TRAZODONE HCL) Take 1 tablet by mouth once a day at bedtime  #90 Each x 0   Entered and Authorized by:   Nani Gasser MD   Signed by:   Nani Gasser MD on 12/06/2009   Method used:   Printed then faxed to ...       [address] [phone]* (retail)       [address] [place], [place]  [address]       Ph: [phone]       Fax: [phone]   RxID:   [id number] ALPRAZOLAM 0.5 MG  TABS (ALPRAZOLAM) Take 1 tablet by mouth two times a day as needed  #180 x 0   Entered and Authorized by:   Nani Gasser MD   Signed by:   Nani Gasser MD on 12/06/2009   Method used:   Printed then faxed to ...       [address] [phone]* (retail)       [address] [place], [place]  [address]       Ph: [phone]       Fax: [phone]   RxID:   [id number]   Flex Sig Next Due:  Not Indicated Hemoccult Next Due:  Not Indicated Last PAP:  Done (08/03/2005 2:30:25 PM) PAP Next Due:  Not Indicated   Laboratory Results   Urine  Tests  Date/Time Received: 12/06/09 Date/Time Reported: 12/06/09  Routine Urinalysis   Color: yellow Appearance: Clear Glucose: negative   (Normal Range: Negative) Bilirubin: negative   (Normal Range: Negative) Ketone: negative   (Normal Range: Negative) Spec. Gravity: 1.010   (Normal Range: 1.003-1.035) Blood: negative   (Normal Range: Negative) pH: 6.5   (Normal Range: 5.0-8.0) Protein: negative   (Normal Range: Negative) Urobilinogen: 0.2   (Normal Range: 0-1) Nitrite: negative   (Normal Range: Negative) Leukocyte Esterace: negative   (Normal Range: Negative)

## 2010-06-03 NOTE — Assessment & Plan Note (Signed)
Summary: HEADACHES, anxiety   Vital Signs:  Patient profile:   76 year old female Height:      67.25 inches Weight:      207 pounds Pulse rate:   82 / minute BP sitting:   110 / 67  (left arm) Cuff size:   regular  Vitals Entered By: Kathlene November (June 28, 2009 9:28 AM) CC: discuss meds for headaches   Primary Care Provider:  Linford Arnold, C  CC:  discuss meds for headaches.  History of Present Illness: discuss meds for headaches.   Hasn't tried the combo of xanax and flexeril to acute onset as had been recommended by Remonia Richter back in April. REviewed her note.  Has been using the meprobromate and paying out of pocket for it but says it is getting expensive and really wants to come off.  Admits hasn' been sleeping well lately.  Has been having nightmares again. Still on her effexor and xanax as needed.   Current Medications (verified): 1)  Enalapril Maleate 5 Mg Tabs (Enalapril Maleate) .... Take 1 Tablet By Mouth Once A Day 2)  Metoprolol Tartrate 25 Mg  Tabs (Metoprolol Tartrate) .... Take 1 Tablet By Mouth Two Times A Day 3)  Zocor 40 Mg  Tabs (Simvastatin) .... Take 1 Tablet By Mouth Once A Day At Bedtime 4)  Alprazolam 0.5 Mg  Tabs (Alprazolam) .... Take 1 Tablet By Mouth Once A Day 5)  Trazodone Hcl 50 Mg  Tabs (Trazodone Hcl) .... Take 1 Tablet By Mouth Once A Day At Bedtime 6)  Flexeril 10 Mg Tabs (Cyclobenzaprine Hcl) .... Take 1 Tablet By Mouth Tid Prn Muscle Spasm. 7)  Venlafaxine Hcl 100 Mg Tabs (Venlafaxine Hcl) .... Take 1 Tablet By Mouth Two Times A Day 8)  Meprobamate 400 Mg Tabs (Meprobamate) .... Take 1 Tablet By Mouth Once A Day As Needed Migraine.  Allergies (verified): 1)  ! Keflex 2)  ! Sulfa 3)  ! * Insect Venom 4)  ! * Shellfish  Comments:  Nurse/Medical Assistant: The patient's medications and allergies were reviewed with the patient and were updated in the Medication and Allergy Lists. Kathlene November (June 28, 2009 9:28 AM)  Physical  Exam  General:  Well-developed,well-nourished,in no acute distress; alert,appropriate and cooperative throughout examination Lungs:  Normal respiratory effort, chest expands symmetrically. Lungs are clear to auscultation, no crackles or wheezes. Heart:  Normal rate and regular rhythm. S1 and S2 normal without gallop, murmur, click, rub or other extra sounds. Psych:  Cognition and judgment appear intact. Alert and cooperative with normal attention span and concentration. No apparent delusions, illusions, hallucinations   Impression & Recommendations:  Problem # 1:  MIGRAINE, UNSPEC., W/O INTRACTABLE MIGRAINE (ICD-346.90) Discussed tyring the combo of flexeril and xanax.  IF this isn't working well then can increase the dose of hte xanax adn see if helping.  Will d/c the meprobomate. She is not a candidate for tryptans.  Also really need to work on getting her anxiety and sleep under better control as clearly this will affect the frequency of her HA. She is still in counselig but hasn't been in awhile.   The following medications were removed from the medication list:    Tramadol Hcl 50 Mg Tabs (Tramadol hcl) .Marland Kitchen... Take 1 tablet by mouth three times a day as needed Her updated medication list for this problem includes:    Metoprolol Tartrate 25 Mg Tabs (Metoprolol tartrate) .Marland Kitchen... Take 1 tablet by mouth two times a day  Problem # 2:  PANIC ATTACKS (ICD-300.01) Assessment: Deteriorated Not well controlled anxiety adn having nightmares again. Discussed options. Decided to wean effexor and change to zoloft for GAD.  Wrote out [employer] regimen. F/u in one month to see how doing. Warned of potential SE.  Call if any concerns.   The following medications were removed from the medication list:    Meprobamate 400 Mg Tabs (Meprobamate) .Marland Kitchen... Take 1 tablet by mouth once a day as needed migraine. Her updated medication list for this problem includes:    Alprazolam 0.5 Mg Tabs (Alprazolam) .Marland Kitchen... Take 1 tablet  by mouth once a day    Trazodone Hcl 50 Mg Tabs (Trazodone hcl) .Marland Kitchen... Take 1 tablet by mouth once a day at bedtime    Sertraline Hcl 50 Mg Tabs (Sertraline hcl) ..... Start with 1/2 a tab for one week daily, then increase to whole tab daily  Complete Medication List: 1)  Enalapril Maleate 5 Mg Tabs (Enalapril maleate) .... Take 1 tablet by mouth once a day 2)  Metoprolol Tartrate 25 Mg Tabs (Metoprolol tartrate) .... Take 1 tablet by mouth two times a day 3)  Zocor 40 Mg Tabs (Simvastatin) .... Take 1 tablet by mouth once a day at bedtime 4)  Alprazolam 0.5 Mg Tabs (Alprazolam) .... Take 1 tablet by mouth once a day 5)  Trazodone Hcl 50 Mg Tabs (Trazodone hcl) .... Take 1 tablet by mouth once a day at bedtime 6)  Flexeril 10 Mg Tabs (Cyclobenzaprine hcl) .... Take 1 tablet by mouth tid prn muscle spasm. 7)  Sertraline Hcl 50 Mg Tabs (Sertraline hcl) .... Start with 1/2 a tab for one week daily, then increase to whole tab daily  Patient Instructions: 1)  Drop venlafaxine to 1 tab in AM and 1/2 tab in PM for 5 days, then drop to half a tab in AM and half in PM for  5 days. 2)  Then drop to 1/2 tab in AM only and start the sertraline with 1/2 tab in AM. After one week stop the venlafaxine completely and then increase the sertraline to a whole tab.  3)  Please schedule a follow-up appointment in 1 month.  Prescriptions: FLEXERIL 10 MG TABS (CYCLOBENZAPRINE HCL) Take 1 tablet by mouth tid prn muscle spasm.  #30 Each x 1   Entered and Authorized by:   Nani Gasser MD   Signed by:   Nani Gasser MD on 06/28/2009   Method used:   Electronically to        [facility] [phone]* (retail)       [address] [place], [place]  [address]       Ph: [phone]       Fax: [phone]   RxID:   [id number] SERTRALINE HCL 50 MG TABS (SERTRALINE HCL) Start with 1/2 a tab for one week daily, then increase to whole tab daily  #30 x 0   Entered and Authorized by:   Nani Gasser  MD   Signed by:   Nani Gasser MD on 06/28/2009   Method used:   Electronically to        [facility] [phone]* (retail)       [address]       [place], [place]  [address]       Ph: [phone]       Fax: [phone]   RxID:   [id number]

## 2010-06-03 NOTE — Progress Notes (Signed)
Summary: Triage Call - Nauseated and Diarrhea  Phone Note Call from Patient   Caller: Patient Summary of Call: Pt. Called and states that she needs to be seen today for Diarrhea and now nauseated, What do you suggest? She states she seen Dr.Abdalrahman Clementson last week for Diarrhea...  Call pt. back at [phone] Initial call taken by: Michaelle Copas,  October 09, 2009 9:53 AM  Follow-up for Phone Call        Called Pt back- Pt c/o diarrhea, nausea, abd pain and increased gasx 5 days. I advised Pt she will need to go to ED for poss. fluids, labs and scans. Pt wanted me to make sure that you agree. Please advise. Follow-up by: Payton Spark CMA,  October 09, 2009 10:20 AM  Additional Follow-up for Phone Call Additional follow up Details #1::        I do not have any slots to get her in today.  Advise ED or UC visit today. Additional Follow-up by: Seymour Bars DO,  October 09, 2009 10:29 AM     Appended Document: Triage Call - Nauseated and Diarrhea Pt verbalized understanding and will be seen somewhere.

## 2010-06-03 NOTE — Progress Notes (Signed)
Summary: MEDICATION  Phone Note Call from Patient Call back at Home Phone [phone]   Reason for Call: Talk to Doctor Summary of Call: PT NEEDS RX FOR EFFOR 100 MG CALLED INTO [facility] [phone].  PT ONLY HAD SAMPLES.   Initial call taken by: TORI  Follow-up for Phone Call        Rx for effexor called to [facility] per MD order. Patient informed. Follow-up by: Harlene Salts,  August 27, 2006 1:28 PM  New/Updated Medications: EFFEXOR 100 MG TABS (VENLAFAXINE HCL) Take 1 tablet by mouth two times a day  New/Updated Medications: EFFEXOR 100 MG TABS (VENLAFAXINE HCL) Take 1 tablet by mouth two times a day  Prescriptions: EFFEXOR 100 MG TABS (VENLAFAXINE HCL) Take 1 tablet by mouth two times a day  #60 x 2   Entered and Authorized by:   Nani Gasser MD   Signed by:   Nani Gasser MD on 08/27/2006   Method used:   Print then Give to Patient   RxID:   [id number]

## 2010-06-03 NOTE — Assessment & Plan Note (Signed)
Summary: panic attacks   Vital Signs:  Patient profile:   76 year old female Height:      67.25 inches Weight:      209 pounds BMI:     32.61 O2 Sat:      97 % on Room air Pulse rate:   76 / minute BP sitting:   105 / 61  (left arm) Cuff size:   regular  Vitals Entered By: Payton Spark CMA (Sep 20, 2009 10:19 AM)  O2 Flow:  Room air CC: F/U anxiety. Doing well.    Primary Care Provider:  Nani Gasser MD  CC:  F/U anxiety. Doing well. Marland Kitchen  History of Present Illness: 76 yo WF present for f/u GAD.  She has not been having panic attacks.  Last month, Dr Linford Arnold increased her Zoloft from 100 mg to 200 mg / day.  She is not doing any counseling.  She was seeing Merlene Morse in the past.  She is taking Xanax once a day.  She is having some anxiety about going over bridges b/c this is when she had her first panic attack.  She has seen Bonita Quin down the hall for her migraines.  She takes the xanax + flexeril for onset of migraine which really helps.  Her anxiety is slightly better from 1 month ago.  Home life stable.  Sleeping OK.      Current Medications (verified): 1)  Enalapril Maleate 5 Mg Tabs (Enalapril Maleate) .... Take 1 Tablet By Mouth Once A Day 2)  Metoprolol Tartrate 25 Mg  Tabs (Metoprolol Tartrate) .... Take 1 Tablet By Mouth Two Times A Day 3)  Zocor 40 Mg  Tabs (Simvastatin) .... Take 1 Tablet By Mouth Once A Day At Bedtime 4)  Alprazolam 0.5 Mg  Tabs (Alprazolam) .... Take 1 Tablet By Mouth Two Times A Day As Needed 5)  Trazodone Hcl 50 Mg  Tabs (Trazodone Hcl) .... Take 1 Tablet By Mouth Once A Day At Bedtime 6)  Flexeril 10 Mg Tabs (Cyclobenzaprine Hcl) .... Take 1 Tablet By Mouth Tid Prn Muscle Spasm. 7)  Zoloft 100 Mg Tabs (Sertraline Hcl) .... 2 Tabs By Mouth Daily.  Allergies (verified): 1)  ! Keflex 2)  ! Sulfa 3)  ! * Insect Venom 4)  ! * Shellfish  Past History:  Past Medical History: Reviewed history from 02/16/2006 and no changes required. Hx of  Depression  Hx of GI ulcer , Hx Seasonal Allergies  Meds: Meprobamate 400mg  BID #80/mo  Social History: Reviewed history from 02/09/2006 and no changes required. Married to [place] with 2 children ages 21, 33.  LMP greater than 10 yrs ago.  Quit smoking 1997.  3# EtOH per week.  2 caffeinated drinks per day.  Walks 20 min per day.  No longer sexually active.  Review of Systems      See HPI  Physical Exam  General:  alert, well-developed, well-nourished, well-hydrated, and overweight-appearing.   Skin:  color normal.   Psych:  good eye contact, not anxious appearing, and not depressed appearing.     Impression & Recommendations:  Problem # 1:  PANIC ATTACKS (ICD-300.01) Anxiety/ panic attacks have improved.  GAD -7 score is 7, down from 2 with increased dose of Zoloft x 4 wk.  No change in Xanax RX.  Since she is >74, will add a low dose of Abilify once a day as mood stabilizer.  Since panic attacks are situational, she may benefit from psychotherapy (not counseling).  RTC in2 mos.  Call if any problems. Her updated medication list for this problem includes:    Alprazolam 0.5 Mg Tabs (Alprazolam) .Marland Kitchen... Take 1 tablet by mouth two times a day as needed    Trazodone Hcl 50 Mg Tabs (Trazodone hcl) .Marland Kitchen... Take 1 tablet by mouth once a day at bedtime    Zoloft 100 Mg Tabs (Sertraline hcl) .Marland Kitchen... 2 tabs by mouth daily.  Complete Medication List: 1)  Enalapril Maleate 5 Mg Tabs (Enalapril maleate) .... Take 1 tablet by mouth once a day 2)  Metoprolol Tartrate 25 Mg Tabs (Metoprolol tartrate) .... Take 1 tablet by mouth two times a day 3)  Zocor 40 Mg Tabs (Simvastatin) .... Take 1 tablet by mouth once a day at bedtime 4)  Alprazolam 0.5 Mg Tabs (Alprazolam) .... Take 1 tablet by mouth two times a day as needed 5)  Trazodone Hcl 50 Mg Tabs (Trazodone hcl) .... Take 1 tablet by mouth once a day at bedtime 6)  Flexeril 10 Mg Tabs (Cyclobenzaprine hcl) .... Take 1 tablet by mouth tid prn muscle  spasm. 7)  Zoloft 100 Mg Tabs (Sertraline hcl) .... 2 tabs by mouth daily. 8)  Tramadol Hcl 50 Mg Tabs (Tramadol hcl) .Marland Kitchen.. 1 tab by mouth two times a day as needed back pain 9)  Abilify 2 Mg Tabs (Aripiprazole) .Marland Kitchen.. 1 tab by mouth qam  Patient Instructions: 1)  Add Abilify in the AM as your mood stabilizer. 2)  Stay on Zoloft 200 mg/ day. 3)  Stay on Xanax. 4)  RFd Tramadol for short term use, back pain. 5)  Consider psychotherapy for anxiety. 6)  Return for f/u in 2 mos. Prescriptions: ABILIFY 2 MG TABS (ARIPIPRAZOLE) 1 tab by mouth qAM  #30 x 1   Entered and Authorized by:   Seymour Bars DO   Signed by:   Seymour Bars DO on 09/20/2009   Method used:   Electronically to        [facility] [phone]* (retail)       [address] [place], [place]  [address]       Ph: [phone]       Fax: [phone]   RxID:   [id number] TRAMADOL HCL 50 MG TABS (TRAMADOL HCL) 1 tab by mouth two times a day as needed back pain  #40 x 0   Entered and Authorized by:   Seymour Bars DO   Signed by:   Seymour Bars DO on 09/20/2009   Method used:   Electronically to        [facility] [phone]* (retail)       [address] [place], [place]  [address]       Ph: [phone]       Fax: [phone]   RxID:   [id number]

## 2010-06-03 NOTE — Assessment & Plan Note (Signed)
Summary: Thoracic spine strain   Vital Signs:  Patient Profile:   76 Years Old Female Height:     67.25 inches Weight:      185.25 pounds Pulse rate:   73 / minute BP sitting:   116 / 67  (left arm) Cuff size:   regular  Vitals Entered By: Kathlene November (April 12, 2008 10:30 AM)                 PCP:  Cipriano Bunker  Chief Complaint:  Upper back spasms- x3 wks and taking OTC Tylenol.  History of Present Illness: Upper back pain between the shoulder blades started on the right hadn side.  Started about 3 weeks ago. Was doing some extra work around the house.  Comeing and going, but this week has been worse. Worse when dog pulls on the lease, and when lifts the hatchon the car to close it.  Radiated around the the front yesterday after getting into the car. Almost felt SOB for a few seconds. Sharp zinging pain when gets to a high point.  IN between sharp pains, feels more achey.  No tyelnol this AM. No alleviating sxs. No CP or SOB.  Her abdominal pain from prior visits is better. Says can now differentiate between the IBS and the musle pain.     Current Allergies: ! KEFLEX ! SULFA ! * INSECT VENOM ! * SHELLFISH  Past Medical History:    Reviewed history from 02/16/2006 and no changes required:       Hx of Depression        Hx of GI ulcer       , Hx Seasonal Allergies        Meds: Meprobamate 400mg  BID #80/mo   Social History:    Reviewed history from 02/09/2006 and no changes required:       Married to [place] with 2 children ages 85, 19.  LMP greater than 10 yrs ago.  Quit smoking 1997.  3# EtOH per week.  2 caffeinated drinks per day.  Walks 20 min per day.  No longer sexually active.     Physical Exam  General:     Well-developed,well-nourished,in no acute distress; alert,appropriate and cooperative throughout examination Msk:     Tender parapsonous muscles bilat in the thoracid spine area. Nontender over the spine. Normal ROM of neck, shoulders, and lumbar spine.   Strength 5/5 in shoulders.  Pulses:     Radial 2+  Neurologic:     Reflexs in UE and LE 2+     Impression & Recommendations:  Problem # 1:  BACK STRAIN, THORACIC (ICD-847.1) Assessment: New Discussed likely a herniated disk. REcommend home PT, Tylneol (since doesn't tolerate NSAIDS) and heat pack. Call if not getting better over the next coupld of weeks. If not better will get xray and set up for formal PT. Also will try muscle relaxers at bedtime. Warned of potential sedation.   Complete Medication List: 1)  Hydroxyzine Hcl 25 Mg/ml Soln (Hydroxyzine hcl) .... One by mouth prn 2)  Enalapril Maleate 5 Mg Tabs (Enalapril maleate) .... Take 1 tablet by mouth once a day 3)  Loratadine 10 Mg Tabs (Loratadine) .... One by mouth daily prn 4)  Metoprolol Tartrate 25 Mg Tabs (Metoprolol tartrate) .... Take 1 tablet by mouth two times a day 5)  Zocor 40 Mg Tabs (Simvastatin) .... Take 1 tablet by mouth once a day at bedtime 6)  Meprobamate 400 Mg Tabs (Meprobamate) .... One by  mouth  2-3 times a day. 7)  Alprazolam 0.5 Mg Tabs (Alprazolam) .... Take 1 tablet by mouth once a day 8)  Trazodone Hcl 50 Mg Tabs (Trazodone hcl) .... Take 1 tablet by mouth once a day at bedtime 9)  Flexeril 10 Mg Tabs (Cyclobenzaprine hcl) .... Take 1 tablet by mouth once a day at bedtime as needed muscle spasm.    Prescriptions: FLEXERIL 10 MG TABS (CYCLOBENZAPRINE HCL) Take 1 tablet by mouth once a day at bedtime as needed muscle spasm.  #30 x 0   Entered and Authorized by:   Nani Gasser MD   Signed by:   Nani Gasser MD on 04/12/2008   Method used:   Print then Give to Patient   RxID:   [id number]  ]

## 2010-06-03 NOTE — Progress Notes (Signed)
  Phone Note Call from Patient Call back at Home Phone [phone]   Caller: Patient Call For: Nani Gasser MD Summary of Call: pt wanting a refill on her Meprobamate Send to [facility] in [place] Initial call taken by: Kathlene November,  November 25, 2007 10:29 AM      Prescriptions: MEPROBAMATE 400 MG TABS (MEPROBAMATE) one by mouth  2-3 times a day.  #90 x 2   Entered and Authorized by:   Nani Gasser MD   Signed by:   Nani Gasser MD on 11/25/2007   Method used:   Print then Give to Patient   RxID:   [id number]

## 2010-06-03 NOTE — Assessment & Plan Note (Signed)
Summary: IBS flare   Vital Signs:  Patient profile:   76 year old female Height:      67.25 inches Pulse rate:   68 / minute BP sitting:   122 / 76  (right arm) Cuff size:   regular  Vitals Entered By: Avon Gully CMA, Duncan Dull) (February 19, 2010 10:07 AM) CC: abdominal discomfort,bloating, as soon as finishing eating has have a BM   Primary Care Provider:  Nani Gasser MD  CC:  abdominal discomfort, bloating, and as soon as finishing eating has have a BM.  History of Present Illness: abdominal discomfort,bloating, as soon as finishing eating has have a BM.  Previously had been avoiding triggers of her IBS. Still using immodium but only slows it down a little bit, where as before it woud cause constipation.  Having alot of diarrhea. No alleviating factors.  Got worse after eating a salad.  Stool looks green.  hx of diverticulosis.  No fever. She gets alot of abdominal cramping with it.  Recently started a probiotic which she thought she was helping until she ate a salad 2 days ago and her sxs have been much much worse; No blood in stool. Colonscopy is uptodate.  Seen in UC in June for possible diverticulitis episdoes.   Current Medications (verified): 1)  Enalapril Maleate 5 Mg Tabs (Enalapril Maleate) .... Take 1 Tablet By Mouth Once A Day 2)  Metoprolol Tartrate 25 Mg  Tabs (Metoprolol Tartrate) .... Take 1 Tablet By Mouth Two Times A Day 3)  Zocor 40 Mg  Tabs (Simvastatin) .... Take 1 Tablet By Mouth Once A Day At Bedtime 4)  Alprazolam 0.5 Mg  Tabs (Alprazolam) .... Take 1 Tablet By Mouth Two Times A Day As Needed 5)  Trazodone Hcl 50 Mg  Tabs (Trazodone Hcl) .... Take 1 Tablet By Mouth Once A Day At Bedtime 6)  Zoloft 100 Mg Tabs (Sertraline Hcl) .... 2 Tabs By Mouth Daily. 7)  Imodium A-D 2 Mg Tabs (Loperamide Hcl) .... As Needed 8)  Meprobamate 400 Mg Tabs (Meprobamate) .... Take 1 Tablet By Mouth Once A Day As Needed For Migraine.  Allergies (verified): 1)  !  Keflex 2)  ! Sulfa 3)  ! * Insect Venom 4)  ! * Shellfish 5)  Abilify (Aripiprazole)  Past History:  Past Medical History: Hx of Depression  Hx of GI ulcer , Hx Seasonal Allergies  Past Surgical History: Reviewed history from 10/09/2009 and no changes required. Cataract surgery Bilat - 2004  RUQ U/S 10/06- negative  Physical Exam  General:  Well-developed,well-nourished,in no acute distress; alert,appropriate and cooperative throughout examination Lungs:  Normal respiratory effort, chest expands symmetrically. Lungs are clear to auscultation, no crackles or wheezes. Heart:  Normal rate and regular rhythm. S1 and S2 normal without gallop, murmur, click, rub or other extra sounds. Abdomen:  soft, normal bowel sounds, no distention, no masses, no guarding, no hepatomegaly, and no splenomegaly.  Diffusely tender.  Skin:  no rashes.   Psych:  Cognition and judgment appear intact. Alert and cooperative with normal attention span and concentration. No apparent delusions, illusions, hallucinations   Impression & Recommendations:  Problem # 1:  IBS (ICD-564.1)  I really feel this is a IBS flare. Discussed diet adn will try Levbid two times a day for sxs relief. Also recommend f/u with GI for further  evaluation adn treatment. Nothing on exam or history to indicate infection such as diverticulitis at this time. call if sxs worsen.  Stay hydrated.  Orders: Gastroenterology Referral (GI)  Complete Medication List: 1)  Enalapril Maleate 5 Mg Tabs (Enalapril maleate) .... Take 1 tablet by mouth once a day 2)  Metoprolol Tartrate 25 Mg Tabs (Metoprolol tartrate) .... Take 1 tablet by mouth two times a day 3)  Zocor 40 Mg Tabs (Simvastatin) .... Take 1 tablet by mouth once a day at bedtime 4)  Alprazolam 0.5 Mg Tabs (Alprazolam) .... Take 1 tablet by mouth two times a day as needed 5)  Trazodone Hcl 50 Mg Tabs (Trazodone hcl) .... Take 1 tablet by mouth once a day at bedtime 6)  Zoloft  100 Mg Tabs (Sertraline hcl) .... 2 tabs by mouth daily. 7)  Imodium A-d 2 Mg Tabs (Loperamide hcl) .... As needed 8)  Meprobamate 400 Mg Tabs (Meprobamate) .... Take 1 tablet by mouth once a day as needed for migraine. 9)  Levbid 0.375 Mg Xr12h-tab (Hyoscyamine sulfate) .... Take 1 tablet by mouth two times a day  Patient Instructions: 1)  Start the Levbid two times a day 2)  WE will call you with your GI referral.  Prescriptions: LEVBID 0.375 MG XR12H-TAB (HYOSCYAMINE SULFATE) Take 1 tablet by mouth two times a day  #60 x 0   Entered and Authorized by:   Nani Gasser MD   Signed by:   Nani Gasser MD on 02/19/2010   Method used:   Electronically to        [facility] [phone]* (retail)       [address] [place], [place]  [address]       Ph: [phone]       Fax: [phone]   RxID:   [id number]    Orders Added: 1)  Est. Patient Level IV [53664] 2)  Gastroenterology Referral [GI]

## 2010-06-03 NOTE — Assessment & Plan Note (Signed)
Summary: FU HTN, flu vaccine   Vital Signs:  Patient Profile:   76 Years Old Female Height:     67.25 inches Weight:      183 pounds Pulse rate:   71 / minute BP sitting:   110 / 64  (left arm) Cuff size:   regular  Vitals Entered By: Kathlene November (March 09, 2007 1:54 PM)                 PCP:  Cipriano Bunker  Chief Complaint:  needs meds refilled and flu shot.  History of Present Illness: Mood- Doing well says has decreased Effexor to once a day.  Buspar also taking less frequently adn is doing well. Has been more active and lost 5 pounds since last visit.   Did have mammo performed 03-08-07 followed by Dr. Penelope Galas.   Needs 90 day supply refills on several medications.   Hypertension History:      She denies headache, chest pain, palpitations, dyspnea with exertion, orthopnea, PND, peripheral edema, visual symptoms, neurologic problems, syncope, and side effects from treatment.  She notes no problems with any antihypertensive medication side effects.        Positive major cardiovascular risk factors include female age 28 years old or older, hyperlipidemia, and hypertension.  Negative major cardiovascular risk factors include no history of diabetes, negative family history for ischemic heart disease, and non-tobacco-user status.        Further assessment for target organ damage reveals no history of ASHD, cardiac end-organ damage (CHF/LVH), stroke/TIA, peripheral vascular disease, renal insufficiency, or hypertensive retinopathy.    Lipid Management History:      Positive NCEP/ATP III risk factors include female age 58 years old or older and hypertension.  Negative NCEP/ATP III risk factors include non-diabetic, no family history for ischemic heart disease, non-tobacco-user status, no ASHD (atherosclerotic heart disease), no prior stroke/TIA, and no peripheral vascular disease.      Current Allergies: ! KEFLEX ! SULFA ! * INSECT VENOM ! * SHELLFISH      Physical Exam   General:     Well-developed,well-nourished,in no acute distress; alert,appropriate and cooperative throughout examination Head:     Normocephalic and atraumatic without obvious abnormalities. No apparent alopecia or balding. Eyes:     No corneal or conjunctival inflammation noted. EOMI.  Lungs:     Normal respiratory effort, chest expands symmetrically. Lungs are clear to auscultation, no crackles or wheezes. Heart:     Normal rate and regular rhythm. S1 and S2 normal without gallop, murmur, click, rub or other extra sounds.  No carotid bruits.  Skin:     no rashes.   Psych:     Cognition and judgment appear intact. Alert and cooperative with normal attention span and concentration. No apparent delusions, illusions, hallucinations    Impression & Recommendations:  Problem # 1:  PANIC ATTACKS (ICD-300.01) Very well controlled. Pt says she has lost her job and even with this she is managing very well.  Her updated medication list for this problem includes:    Hydroxyzine Hcl 25 Mg/ml Soln (Hydroxyzine hcl) ..... One by mouth prn    Meprobamate 400 Mg Tabs (Meprobamate) ..... One by mouth  2-3 times a day.    Effexor 100 Mg Tabs (Venlafaxine hcl) .Marland Kitchen... Take 1 tablet by mouth two times a day   Problem # 2:  HYPERTENSION, BENIGN SYSTEMIC (ICD-401.1) Assessment: Unchanged Due for maintenance labs.  Has been one year.  Her updated medication list for  this problem includes:    Enalapril Maleate 5 Mg Tabs (Enalapril maleate) .Marland Kitchen... Take 1 tablet by mouth once a day    Metoprolol Tartrate 50 Mg Tabs (Metoprolol tartrate) ..... One by mouth twice daily  BP today: 110/64 Prior BP: 111/65 (09/23/2006)  Prior 10 Yr Risk Heart Disease: Not enough information (06/15/2006)  Labs Reviewed: Chol: 201 (03/18/2006)   HDL: 59 (03/18/2006)   LDL: 111 (03/18/2006)   TG: 153 (03/18/2006)  Orders: T-Comprehensive Metabolic Panel ([id number]) T-Lipid Profile (62376-28315) T-TSH ([id number])    Complete Medication List: 1)  Hydroxyzine Hcl 25 Mg/ml Soln (Hydroxyzine hcl) .... One by mouth prn 2)  Buspar 15 Mg Tabs (buspirone Hcl)  .... Take 1/2  tablet by mouth three times a day 3)  Enalapril Maleate 5 Mg Tabs (Enalapril maleate) .... Take 1 tablet by mouth once a day 4)  Loratadine 10 Mg Tabs (Loratadine) .... One by mouth daily prn 5)  Metoprolol Tartrate 50 Mg Tabs (Metoprolol tartrate) .... One by mouth twice daily 6)  Zocor 20 Mg Tabs (Simvastatin) .... Take 1 tablet by mouth once a day 7)  Meprobamate 400 Mg Tabs (Meprobamate) .... One by mouth  2-3 times a day. 8)  Effexor 100 Mg Tabs (Venlafaxine hcl) .... Take 1 tablet by mouth two times a day  Other Orders: Flu Vaccine 95yrs + (06269) Admin 1st Vaccine (48546)  Hypertension Assessment/Plan:      The patient's hypertensive risk group is category B: At least one risk factor (excluding diabetes) with no target organ damage.  Her calculated 10 year risk of coronary heart disease is 6 %.  Today's blood pressure is 110/64.  Her blood pressure goal is < 140/90.  Lipid Assessment/Plan:      Based on NCEP/ATP III, the patient's risk factor category is "2 or more risk factors and a calculated 10 year CAD risk of < 20%".  From this information, the patient's calculated lipid goals are as follows: Total cholesterol goal is 200; LDL cholesterol goal is 130; HDL cholesterol goal is 40; Triglyceride goal is 150.       Prescriptions: HYDROXYZINE HCL 25 MG/ML SOLN (HYDROXYZINE HCL) one by mouth prn  #90 x 2   Entered and Authorized by:   Nani Gasser MD   Signed by:   Nani Gasser MD on 03/09/2007   Method used:   Print then Give to Patient   RxID:   [id number] EFFEXOR 100 MG TABS (VENLAFAXINE HCL) Take 1 tablet by mouth two times a day  #180 x 2   Entered and Authorized by:   Nani Gasser MD   Signed by:   Nani Gasser MD on 03/09/2007   Method used:   Print then Give to Patient   RxID:    [id number] MEPROBAMATE 400 MG TABS (MEPROBAMATE) one by mouth  2-3 times a day.  #90 x 2   Entered by:   Kathlene November   Authorized by:   Nani Gasser MD   Signed by:   Nani Gasser MD on 03/09/2007   Method used:   Print then Give to Patient   RxID:   [id number] ZOCOR 20 MG TABS (SIMVASTATIN) Take 1 tablet by mouth once a day  #90 x 2   Entered by:   Kathlene November   Authorized by:   Nani Gasser MD   Signed by:   Nani Gasser MD on 03/09/2007   Method used:   Print then Give to Patient   RxID:   [id number]  ]  Influenza Vaccine    Vaccine Type: Fluvax 3+    Site: left deltoid    Mfr: Novartis    Dose: 0.5 ml    Route: IM    Given by: Kathlene November    Exp. Date: 11/01/2007    Lot #: [id number]    VIS given: 10/31/04 version given March 09, 2007.  Flu Vaccine Consent Questions    Do you have a history of severe allergic reactions to this vaccine? no    Any prior history of allergic reactions to egg and/or gelatin? no    Do you have a sensitivity to the preservative Thimersol? no    Do you have a past history of Guillan-Barre Syndrome? no    Do you currently have an acute febrile illness? no    Have you ever had a severe reaction to latex? no    Vaccine information given and explained to patient? yes    Are you currently pregnant? no

## 2010-06-03 NOTE — Assessment & Plan Note (Signed)
Summary: FU ON BACK PAIN   Vital Signs:  Patient profile:   76 year old female Height:      67.25 inches Weight:      204 pounds Pulse rate:   77 / minute BP sitting:   115 / 69  (left arm) Cuff size:   regular  Vitals Entered By: Kathlene November (December 19, 2008 8:27 AM) CC: continues to have back pain. ? doing PT   Primary Care Provider:  Linford Arnold, C  CC:  continues to have back pain. ? doing PT.  History of Present Illness: continues to have back pain. ? doing PT. Pain has changed a little.  Bending is the most painful and will get a sharp shooting pain.  Worse if bends and turns at the same time.  Will get pain will get pain if goes over a bump.  Now have pain from her mid back on both sides and then radiates down to the the backs of he knees. Painful just sitting. Using hydrocodone for pain, but caused nausea. Has been dong the home exercises, and feels they did seem to help but now feels like her pain is different.  Walking has been painful as well.    Current Medications (verified): 1)  Enalapril Maleate 5 Mg Tabs (Enalapril Maleate) .... Take 1 Tablet By Mouth Once A Day 2)  Metoprolol Tartrate 25 Mg  Tabs (Metoprolol Tartrate) .... Take 1 Tablet By Mouth Two Times A Day 3)  Zocor 40 Mg  Tabs (Simvastatin) .... Take 1 Tablet By Mouth Once A Day At Bedtime 4)  Alprazolam 0.5 Mg  Tabs (Alprazolam) .... Take 1 Tablet By Mouth Once A Day 5)  Trazodone Hcl 50 Mg  Tabs (Trazodone Hcl) .... Take 1 Tablet By Mouth Once A Day At Bedtime 6)  Flexeril 10 Mg Tabs (Cyclobenzaprine Hcl) .... Take 1 Tablet By Mouth Tid Prn Muscle Spasm. 7)  Venlafaxine Hcl 100 Mg Tabs (Venlafaxine Hcl) .... Take 1 Tablet By Mouth Two Times A Day 8)  Meprobamate 400 Mg Tabs (Meprobamate) .... Take 1 Tablet By Mouth Once A Day As Needed Migraine.  Allergies (verified): 1)  ! Keflex 2)  ! Sulfa 3)  ! * Insect Venom 4)  ! * Shellfish  Comments:  Nurse/Medical Assistant: The patient's medications and  allergies were reviewed with the patient and were updated in the Medication and Allergy Lists. Kathlene November (December 19, 2008 8:28 AM)  Physical Exam  General:  Well-developed,well-nourished,in no acute distress; alert,appropriate and cooperative throughout examination Msk:  Normal flexion, extension, rotation right and left. Though had pain with the extremes of rotationa and had pain coming back up to neutral form flexion.  Pain over the mid lumbar spine. No tenderness in the thoracic spine but pain does radiate in to this area.  Neg staright leg raise.  Hip, knee, and ankle strength 5/5.   Extremities:  No LE edema   Impression & Recommendations:  Problem # 1:  LUMBAR SPRAIN AND STRAIN (ICD-847.2) Willl get xray, Suspect she has a herniated disc. Will start with conservative therapy first since doesn't have any weakness.  Will refer for PT.   Orders: T-DG Lumbar Spine 2-3 Views ([id number]) Physical Therapy Referral (PT)  Complete Medication List: 1)  Enalapril Maleate 5 Mg Tabs (Enalapril maleate) .... Take 1 tablet by mouth once a day 2)  Metoprolol Tartrate 25 Mg Tabs (Metoprolol tartrate) .... Take 1 tablet by mouth two times a day 3)  Zocor 40 Mg  Tabs (Simvastatin) .... Take 1 tablet by mouth once a day at bedtime 4)  Alprazolam 0.5 Mg Tabs (Alprazolam) .... Take 1 tablet by mouth once a day 5)  Trazodone Hcl 50 Mg Tabs (Trazodone hcl) .... Take 1 tablet by mouth once a day at bedtime 6)  Flexeril 10 Mg Tabs (Cyclobenzaprine hcl) .... Take 1 tablet by mouth tid prn muscle spasm. 7)  Venlafaxine Hcl 100 Mg Tabs (Venlafaxine hcl) .... Take 1 tablet by mouth two times a day 8)  Meprobamate 400 Mg Tabs (Meprobamate) .... Take 1 tablet by mouth once a day as needed migraine. 9)  Tramadol Hcl 50 Mg Tabs (Tramadol hcl) .... Take 1 tablet by mouth three times a day as needed  Other Orders: T-DG Thoracic Spine ([id number]) Prescriptions: TRAMADOL HCL 50 MG TABS (TRAMADOL HCL) Take 1 tablet by mouth  three times a day as needed  #45 x 0   Entered and Authorized by:   Nani Gasser MD   Signed by:   Nani Gasser MD on 12/19/2008   Method used:   Electronically to        [facility] [phone]* (retail)       [address] [place], [place]  [address]       Ph: [phone]       Fax: [phone]   RxID:   [id number]

## 2010-06-03 NOTE — Letter (Signed)
Summary: New Patient letter  [facility]  [address] [place], [place] [address]   Phone: [phone]  Fax: [phone]       02/19/2010 MRN: [id number]  [facility] [address] [place], [place]  [address]  Dear Ms. Cervi,  Welcome to the Gastroenterology Division at [facility].    You are scheduled to see Dr. Arlyce Dice on 02/24/2010 at 9:00AM on the 3rd floor at [facility], [address]  We ask that you try to arrive at our office 15 minutes prior to your appointment time to allow for check-in.  We would like you to complete the enclosed self-administered evaluation form prior to your visit and bring it with you on the day of your appointment.  We will review it with you.  Also, please bring a complete list of all your medications or, if you prefer, bring the medication bottles and we will list them.  Please bring your insurance card so that we may make a copy of it.  If your insurance requires a referral to see a specialist, please bring your referral form from your primary care physician.  Co-payments are due at the time of your visit and may be paid by cash, check or credit card.     Your office visit will consist of a consult with your physician (includes a physical exam), any laboratory testing he/she may order, scheduling of any necessary diagnostic testing (e.g. x-ray, ultrasound, CT-scan), and scheduling of a procedure (e.g. Endoscopy, Colonoscopy) if required.  Please allow enough time on your schedule to allow for any/all of these possibilities.    If you cannot keep your appointment, please call [phone] to cancel or reschedule prior to your appointment date.  This allows [place] the opportunity to schedule an appointment for another patient in need of care.  If you do not cancel or reschedule by 5 p.m. the business day prior to your appointment date, you will be charged a $50.00 late cancellation/no-show fee.    Thank you for choosing  [facility] for your medical needs.  We appreciate the opportunity to care for you.  Please visit [place] at our website  to learn more about our practice.                     Sincerely,                                                             The Gastroenterology Division

## 2010-06-03 NOTE — Letter (Signed)
Summary: Letter Regarding [facility] Vitamin D Supplement   Letter Regarding [facility] Vitamin D Supplement Study/[facility]   Imported By: Lanelle Bal 04/17/2009 11:33:31  _____________________________________________________________________  External Attachment:    Type:   Image     Comment:   External Document

## 2010-06-03 NOTE — Progress Notes (Signed)
Summary: Not feeling better  Phone Note Call from Patient Call back at Home Phone [phone]   Caller: Patient Call For: Nani Gasser MD Summary of Call: Pt calls and states that she is not feeling much better and was told to call. Uses [employer] main. Initial call taken by: Kathlene November,  April 19, 2009 11:59 AM  Follow-up for Phone Call        Continue until Monday. If not better then will change ABx.  Only been on for 3.5 days.  Follow-up by: Nani Gasser MD,  April 19, 2009 12:02 PM  Additional Follow-up for Phone Call Additional follow up Details #1::        Pt notified of MD inswtructions. KJ LPN Additional Follow-up by: Kathlene November,  April 19, 2009 12:04 PM

## 2010-06-03 NOTE — Assessment & Plan Note (Signed)
Summary: FU Anxiety, Chol, HTN   Vital Signs:  Patient Profile:   76 Years Old Female Height:     67.25 inches Weight:      186 pounds O2 Sat:      95 % Pulse rate:   65 / minute BP sitting:   99 / 54  (left arm) Cuff size:   large  Vitals Entered By: Harlene Salts (July 13, 2007 1:25 PM)             Is Patient Diabetic? No     PCP:  Linford Arnold, C  Chief Complaint:  follow-up BP, cholesterol, and anxiety.  History of Present Illness: Anxiety dowing well weaning off meds. Doing very well wiht this. Down to Effexor 25mg  two times a day. Has continued to work on exercise and helthy diet.  Is enrolled in program at [facility].     Current Allergies: ! KEFLEX ! SULFA ! * INSECT VENOM ! * SHELLFISH      Physical Exam  General:     Well-developed,well-nourished,in no acute distress; alert,appropriate and cooperative throughout examination Head:     Normocephalic and atraumatic without obvious abnormalities. No apparent alopecia or balding. Lungs:     Normal respiratory effort, chest expands symmetrically. Lungs are clear to auscultation, no crackles or wheezes. Heart:     Normal rate and regular rhythm. S1 and S2 normal without gallop, murmur, click, rub or other extra sounds.  No carotid bruits.  Pulses:     Radial 2+ Extremities:     no le EDEMA.     Impression & Recommendations:  Problem # 1:  HYPERTENSION, BENIGN SYSTEMIC (ICD-401.1) Will decrease metoprolol to 25mg  bid. If pressure still well controlled at FU in May then D/C metoprolol and increase enalapril.  Her updated medication list for this problem includes:    Enalapril Maleate 5 Mg Tabs (Enalapril maleate) .Marland Kitchen... Take 1 tablet by mouth once a day    Metoprolol Tartrate 25 Mg Tabs (Metoprolol tartrate) .Marland Kitchen... Take 1 tablet by mouth two times a day   Problem # 2:  PANIC ATTACKS (ICD-300.01) Doing well on the lower dose.  Would like to continue to wean.  Down to 5mg  on Buspar.  Would like to see a  counselor for probelms with her husband.  Her updated medication list for this problem includes:    Hydroxyzine Hcl 25 Mg/ml Soln (Hydroxyzine hcl) ..... One by mouth prn    Meprobamate 400 Mg Tabs (Meprobamate) ..... One by mouth  2-3 times a day.    Effexor 25 Mg Tabs (Venlafaxine hcl) .Marland Kitchen... Take 1 tablet by mouth two times a day    Alprazolam 0.5 Mg Tabs (Alprazolam) .Marland Kitchen... Take 1 tablet by mouth once a day  Orders: Psychology Referral (Psychology)   Problem # 3:  HYPERCHOLESTEROLEMIA (ICD-272.0) Close to goal in November. Recheck in April.  Her updated medication list for this problem includes:    Zocor 20 Mg Tabs (Simvastatin) .Marland Kitchen... Take 1 tablet by mouth once a day  Labs Reviewed: Chol: 207 (03/14/2007)   HDL: 72 (03/14/2007)   LDL: 108 (03/14/2007)   TG: 134 (03/14/2007) SGOT: 21 (03/14/2007)   SGPT: 21 (03/14/2007)  Lipid Goals: Chol Goal: 200 (03/09/2007)   HDL Goal: 40 (03/09/2007)   LDL Goal: 160 (03/15/2007)   TG Goal: 150 (03/09/2007)  Prior 10 Yr Risk Heart Disease: 4 % (03/15/2007)   Complete Medication List: 1)  Hydroxyzine Hcl 25 Mg/ml Soln (Hydroxyzine hcl) .... One by mouth prn 2)  Buspar 15 Mg Tabs (buspirone Hcl)  .... Take 1/3  tablet by mouth three times a day 3)  Enalapril Maleate 5 Mg Tabs (Enalapril maleate) .... Take 1 tablet by mouth once a day 4)  Loratadine 10 Mg Tabs (Loratadine) .... One by mouth daily prn 5)  Metoprolol Tartrate 25 Mg Tabs (Metoprolol tartrate) .... Take 1 tablet by mouth two times a day 6)  Zocor 20 Mg Tabs (Simvastatin) .... Take 1 tablet by mouth once a day 7)  Meprobamate 400 Mg Tabs (Meprobamate) .... One by mouth  2-3 times a day. 8)  Effexor 25 Mg Tabs (Venlafaxine hcl) .... Take 1 tablet by mouth two times a day 9)  Alprazolam 0.5 Mg Tabs (Alprazolam) .... Take 1 tablet by mouth once a day  Other Orders: Gastroenterology Referral (GI)   Patient Instructions: 1)  Decrease the Effexor to once daily for 2  months, then  one every other day for one month and then stop.     Prescriptions: MEPROBAMATE 400 MG TABS (MEPROBAMATE) one by mouth  2-3 times a day.  #90 x 3   Entered and Authorized by:   Nani Gasser MD   Signed by:   Nani Gasser MD on 07/13/2007   Method used:   Print then Give to Patient   RxID:   [id number] ALPRAZOLAM 0.5 MG  TABS (ALPRAZOLAM) Take 1 tablet by mouth once a day  #15 x 0   Entered and Authorized by:   Nani Gasser MD   Signed by:   Nani Gasser MD on 07/13/2007   Method used:   Print then Give to Patient   RxID:   [id number]  ]

## 2010-06-03 NOTE — Assessment & Plan Note (Signed)
Summary: leave a U/A, ? UTI - jr  Nurse Visit    Allergies: 1)  ! Keflex 2)  ! Sulfa 3)  ! * Insect Venom 4)  ! * Shellfish 5)  Abilify (Aripiprazole) Laboratory Results   Urine Tests  Date/Time Received: 01/08/10 Date/Time Reported: 01/08/10  Routine Urinalysis   Color: yellow Appearance: Clear Glucose: negative   (Normal Range: Negative) Bilirubin: negative   (Normal Range: Negative) Ketone: negative   (Normal Range: Negative) Spec. Gravity: 1.010   (Normal Range: 1.003-1.035) Blood: negative   (Normal Range: Negative) pH: 6.0   (Normal Range: 5.0-8.0) Protein: negative   (Normal Range: Negative) Urobilinogen: 0.2   (Normal Range: 0-1) Nitrite: negative   (Normal Range: Negative) Leukocyte Esterace: negative   (Normal Range: Negative)       Orders Added: 1)  UA Dipstick w/o Micro (automated)  [81003] 2)  T-Culture, Urine [[id number]]  Call pt and let her know UA dipstikc was neg. Will send cx. Will call with results. September  7, 201111:39 AM [place] MD, Santina Evans   01/08/10 called and left message on pt's vm with above info.[id number]

## 2010-06-03 NOTE — Miscellaneous (Signed)
Summary: Labs  Clinical Lists Changes  Observations: Added new observation of TRIGLYCERIDE: 152 mg/dL (62/95/2841 32:44) Added new observation of HDL: 73 mg/dL (05/06/7251 66:44) Added new observation of LDL: 61 mg/dL (03/47/4259 56:38) Added new observation of CHOLESTEROL: 164 mg/dL (75/64/3329 51:88) Added new observation of CALCIUM: 9.5 mg/dL (41/66/0630 16:01) Added new observation of ALBUMIN: 4.5 g/dL (09/32/3557 32:20) Added new observation of SGPT (ALT): 15 units/L (03/06/2009 14:37) Added new observation of SGOT (AST): 23 units/L (03/06/2009 14:37) Added new observation of ALK PHOS: 81 units/L (03/06/2009 14:37) Added new observation of CREATININE: 0.69 mg/dL (25/42/7062 37:62) Added new observation of BUN: 16 mg/dL (83/15/1761 60:73) Added new observation of BG RANDOM: 86 mg/dL (71/10/2692 85:46) Added new observation of CO2 PLSM/SER: 25 meq/L (03/06/2009 14:37) Added new observation of CL SERUM: 100 meq/L (03/06/2009 14:37) Added new observation of K SERUM: 4.4 meq/L (03/06/2009 14:37) Added new observation of NA: 138 meq/L (03/06/2009 14:37)      Lipid Panel Test Date: 03/06/2009                        Value        Units        H/L   Reference  Cholesterol:          164          mg/dL              (270-350) LDL Cholesterol:      61           mg/dL              (09-381) HDL Cholesterol:      73           mg/dL              (82-99) Triglyceride:         152          mg/dL              (37-169)

## 2010-06-03 NOTE — Assessment & Plan Note (Signed)
Summary: UTI, migraine   Vital Signs:  Patient profile:   76 year old female Height:      67.25 inches Weight:      206 pounds Temp:     98.1 degrees F oral Pulse rate:   70 / minute BP sitting:   119 / 75  (right arm) Cuff size:   regular  Vitals Entered By: Avon Gully CMA, Duncan Dull) (January 03, 2010 10:22 AM) CC: chills,HA,nausea,urinary frequency since last pm   Primary Care Provider:  Nani Gasser MD  CC:  chills, HA, nausea, and urinary frequency since last pm.  History of Present Illness: chills,HA,nausea,urinary frequency since last pm. Felt better to lay flat.  Started with shivers for about an hour.  Took cystex last night and took one cephalexin last night.  No hematuria.  Last night some pelvic pain.  Today nuase adn severe HA.  Current Medications (verified): 1)  Enalapril Maleate 5 Mg Tabs (Enalapril Maleate) .... Take 1 Tablet By Mouth Once A Day 2)  Metoprolol Tartrate 25 Mg  Tabs (Metoprolol Tartrate) .... Take 1 Tablet By Mouth Two Times A Day 3)  Zocor 40 Mg  Tabs (Simvastatin) .... Take 1 Tablet By Mouth Once A Day At Bedtime 4)  Alprazolam 0.5 Mg  Tabs (Alprazolam) .... Take 1 Tablet By Mouth Two Times A Day As Needed 5)  Trazodone Hcl 50 Mg  Tabs (Trazodone Hcl) .... Take 1 Tablet By Mouth Once A Day At Bedtime 6)  Zoloft 100 Mg Tabs (Sertraline Hcl) .... 2 Tabs By Mouth Daily. 7)  Imodium A-D 2 Mg Tabs (Loperamide Hcl) .... As Needed 8)  Promethazine Hcl 25 Mg  Tabs (Promethazine Hcl) .... Sig 1 By Mouth Q 6-8hrs Prn  Allergies (verified): 1)  ! Keflex 2)  ! Sulfa 3)  ! * Insect Venom 4)  ! * Shellfish 5)  Abilify (Aripiprazole)  Comments:  Nurse/Medical Assistant: The patient's medications and allergies were reviewed with the patient and were updated in the Medication and Allergy Lists. Avon Gully CMA, Duncan Dull) (January 03, 2010 10:29 AM)  Physical Exam  General:  Well-developed,well-nourished,in no acute distress;  alert,appropriate and cooperative throughout examination Nose:  External nasal examination shows no deformity or inflammation. Nasal mucosa are pink and moist without lesions or exudates. Mouth:  Oral mucosa and oropharynx without lesions or exudates.  Teeth in good repair. Msk:  NO CVA tenderness.    Impression & Recommendations:  Problem # 1:  UTI (ICD-599.0) Wil tx with cipro. Call if not getting better.  Wil send a culture. Go to ED if fever or starts vomiting.  Orders: T-Urine Culture (Spectrum Order) [id number]) UA Dipstick w/o Micro (manual) (09811) Ketorolac-Toradol 15mg  (B1478) Promethazine up to 50mg  (J2550) Admin of Therapeutic Inj (IM or [place]) (29562) Prescription Created Electronically [id number])  Her updated medication list for this problem includes:    Ciprofloxacin Hcl 500 Mg Tabs (Ciprofloxacin hcl) .Marland Kitchen... Take 1 tablet by mouth two times a day for 3 days  Problem # 2:  MIGRAINE, UNSPEC., W/O INTRACTABLE MIGRAINE (ICD-346.90) She is aware of the risk of meprobamate but really feels it is the best thing for her HA and doesn't make her feel sick and nauseated when she uses them. Knows her insurance won't cover it but willing to pay out of pocket.  Her updated medication list for this problem includes:    Metoprolol Tartrate 25 Mg Tabs (Metoprolol tartrate) .Marland Kitchen... Take 1 tablet by mouth two times a day  Complete Medication List: 1)  Enalapril Maleate 5 Mg Tabs (Enalapril maleate) .... Take 1 tablet by mouth once a day 2)  Metoprolol Tartrate 25 Mg Tabs (Metoprolol tartrate) .... Take 1 tablet by mouth two times a day 3)  Zocor 40 Mg Tabs (Simvastatin) .... Take 1 tablet by mouth once a day at bedtime 4)  Alprazolam 0.5 Mg Tabs (Alprazolam) .... Take 1 tablet by mouth two times a day as needed 5)  Trazodone Hcl 50 Mg Tabs (Trazodone hcl) .... Take 1 tablet by mouth once a day at bedtime 6)  Zoloft 100 Mg Tabs (Sertraline hcl) .... 2 tabs by mouth daily. 7)  Imodium A-d 2 Mg  Tabs (Loperamide hcl) .... As needed 8)  Promethazine Hcl 25 Mg Tabs (Promethazine hcl) .... Sig 1 by mouth q 6-8hrs prn 9)  Ciprofloxacin Hcl 500 Mg Tabs (Ciprofloxacin hcl) .... Take 1 tablet by mouth two times a day for 3 days 10)  Meprobamate 400 Mg Tabs (Meprobamate) .... Take 1 tablet by mouth once a day as needed for migraine.  Patient Instructions: 1)  Complete Antibiotic. 2)  We will call you with the culture results 3)  Go home and rest today. Go to Urgent Care if fever over 100.5 or if starts vomiting.  Prescriptions: MEPROBAMATE 400 MG TABS (MEPROBAMATE) Take 1 tablet by mouth once a day as needed for migraine.  #90 x 0   Entered and Authorized by:   Nani Gasser MD   Signed by:   Nani Gasser MD on 01/03/2010   Method used:   Printed then faxed to ...       [facility] [phone]* (retail)       [address] [place], [place]  [address]       Ph: [phone]       Fax: [phone]   RxID:   [id number] CIPROFLOXACIN HCL 500 MG TABS (CIPROFLOXACIN HCL) Take 1 tablet by mouth two times a day for 3 days  #6 x 0   Entered and Authorized by:   Nani Gasser MD   Signed by:   Nani Gasser MD on 01/03/2010   Method used:   Electronically to        [facility] [phone]* (retail)       [address] [place], [place]  [address]       Ph: [phone]       Fax: [phone]   RxID:   [id number]   Laboratory Results   Urine Tests  Date/Time Received: 01/03/10 Date/Time Reported: 01/03/10  Routine Urinalysis   Color: yellow Appearance: Clear Glucose: negative   (Normal Range: Negative) Bilirubin: negative   (Normal Range: Negative) Ketone: negative   (Normal Range: Negative) Spec. Gravity: 1.010   (Normal Range: 1.003-1.035) Blood: negative   (Normal Range: Negative) pH: 6.0   (Normal Range: 5.0-8.0) Protein: negative   (Normal Range: Negative) Urobilinogen: 0.2   (Normal Range: 0-1) Nitrite: negative   (Normal Range:  Negative) Leukocyte Esterace: trace   (Normal Range: Negative)        Medication Administration  Injection # 1:    Medication: Ketorolac-Toradol 15mg     Diagnosis: UTI (ICD-599.0)    Route: IM    Site: RUOQ gluteus    Exp Date: 04/04/2011    Lot #: [id number]    Mfr: [email or web address]    Comments: 60 mg/ml given  Patient tolerated injection without complications    Given by: Avon Gully CMA, Duncan Dull) (January 03, 2010 11:01 AM)  Injection # 2:    Medication: Promethazine up to 50mg     Diagnosis: UTI (ICD-599.0)    Route: IM    Site: LUOQ gluteus    Exp Date: 04/04/2011    Lot #: [id number]    Mfr: [email or web address]    Patient tolerated injection without complications    Given by: Avon Gully CMA, Duncan Dull) (January 03, 2010 11:03 AM)  Orders Added: 1)  T-Urine Culture (Spectrum Order) [id number] 2)  UA Dipstick w/o Micro (manual) [81002] 3)  Ketorolac-Toradol 15mg  [J1885] 4)  Promethazine up to 50mg  [J2550] 5)  Admin of Therapeutic Inj (IM or [place]) [96372] 6)  Est. Patient Level III [40102] 7)  Prescription Created Electronically [phone]

## 2010-06-03 NOTE — Assessment & Plan Note (Signed)
Summary: 6 WEEK FU Panic Attacks.   Vital Signs:  Patient profile:   76 year old female Height:      67.25 inches Weight:      209 pounds Pulse rate:   84 / minute BP sitting:   115 / 67  (left arm) Cuff size:   regular  Vitals Entered By: Kathlene November (August 30, 2009 10:18 AM) CC: followup mood- pt states feels better but feels she could be better on increase of dose   Primary Care Provider:  Linford Arnold, C  CC:  followup mood- pt states feels better but feels she could be better on increase of dose.  History of Present Illness: followup mood- pt states feels better but feels she could be better on increase of dose.  Notices some anxiety with going over bridges, etc that she really didn't have on the effexor. But on the effexor was having bad nightmares and these have mostly subsided which she is very happy about. Tolerating the higher dose well with no S.E.   Current Medications (verified): 1)  Enalapril Maleate 5 Mg Tabs (Enalapril Maleate) .... Take 1 Tablet By Mouth Once A Day 2)  Metoprolol Tartrate 25 Mg  Tabs (Metoprolol Tartrate) .... Take 1 Tablet By Mouth Two Times A Day 3)  Zocor 40 Mg  Tabs (Simvastatin) .... Take 1 Tablet By Mouth Once A Day At Bedtime 4)  Alprazolam 0.5 Mg  Tabs (Alprazolam) .... Take 1 Tablet By Mouth Two Times A Day As Needed 5)  Trazodone Hcl 50 Mg  Tabs (Trazodone Hcl) .... Take 1 Tablet By Mouth Once A Day At Bedtime 6)  Flexeril 10 Mg Tabs (Cyclobenzaprine Hcl) .... Take 1 Tablet By Mouth Tid Prn Muscle Spasm. 7)  Zoloft 100 Mg Tabs (Sertraline Hcl) .Marland Kitchen.. 1 and 1/2 Tabs Daily  Allergies (verified): 1)  ! Keflex 2)  ! Sulfa 3)  ! * Insect Venom 4)  ! * Shellfish  Comments:  Nurse/Medical Assistant: The patient's medications and allergies were reviewed with the patient and were updated in the Medication and Allergy Lists. Kathlene November (August 30, 2009 10:19 AM)  Physical Exam  General:  Well-developed,well-nourished,in no acute distress;  alert,appropriate and cooperative throughout examination Psych:  Cognition and judgment appear intact. Alert and cooperative with normal attention span and concentration. No apparent delusions, illusions, hallucinations   Impression & Recommendations:  Problem # 1:  PANIC ATTACKS (ICD-300.01) Lets increase to 200mg  weekly.  Repeat GAD-7 score 12 (down from 17) but still not at goal. increase dose to 200mg  and f/u in 3-4 weeks. Rec repeat GAD-7 and if not at goal of Less than 5 then recommend add a mood stabilizer. WE discussed this plan today in the office and pt is OK with this. Consider either seroquel or Abilify low dose at bedtime if needed. Hopefully she will be at goal on the inc dose of sertraline.  Still uses her Xanax as needed but this is mostly for migraines.   Her updated medication list for this problem includes:    Alprazolam 0.5 Mg Tabs (Alprazolam) .Marland Kitchen... Take 1 tablet by mouth two times a day as needed    Trazodone Hcl 50 Mg Tabs (Trazodone hcl) .Marland Kitchen... Take 1 tablet by mouth once a day at bedtime    Zoloft 100 Mg Tabs (Sertraline hcl) .Marland Kitchen... 2 tabs by mouth daily.  Problem # 2:  MIGRAINE, UNSPEC., W/O INTRACTABLE MIGRAINE (ICD-346.90) OK to refill her xanax and flexeril. .  She says she is  not out yet.  Told her to call when needs refill.  Her updated medication list for this problem includes:    Metoprolol Tartrate 25 Mg Tabs (Metoprolol tartrate) .Marland Kitchen... Take 1 tablet by mouth two times a day  Complete Medication List: 1)  Enalapril Maleate 5 Mg Tabs (Enalapril maleate) .... Take 1 tablet by mouth once a day 2)  Metoprolol Tartrate 25 Mg Tabs (Metoprolol tartrate) .... Take 1 tablet by mouth two times a day 3)  Zocor 40 Mg Tabs (Simvastatin) .... Take 1 tablet by mouth once a day at bedtime 4)  Alprazolam 0.5 Mg Tabs (Alprazolam) .... Take 1 tablet by mouth two times a day as needed 5)  Trazodone Hcl 50 Mg Tabs (Trazodone hcl) .... Take 1 tablet by mouth once a day at bedtime 6)   Flexeril 10 Mg Tabs (Cyclobenzaprine hcl) .... Take 1 tablet by mouth tid prn muscle spasm. 7)  Zoloft 100 Mg Tabs (Sertraline hcl) .... 2 tabs by mouth daily.  Patient Instructions: 1)  Please schedule a follow-up appointment in 3-4 weeks for her anxiety. 2)  New rx sent for your zoloft.   Prescriptions: METOPROLOL TARTRATE 25 MG  TABS (METOPROLOL TARTRATE) Take 1 tablet by mouth two times a day  #180 x 3   Entered and Authorized by:   Nani Gasser MD   Signed by:   Nani Gasser MD on 08/30/2009   Method used:   Electronically to        [facility] [phone]* (retail)       [address] [place], [place]  [address]       Ph: [phone]       Fax: [phone]   RxID:   [id number] ENALAPRIL MALEATE 5 MG TABS (ENALAPRIL MALEATE) Take 1 tablet by mouth once a day  #90 x 3   Entered and Authorized by:   Nani Gasser MD   Signed by:   Nani Gasser MD on 08/30/2009   Method used:   Electronically to        [facility] [phone]* (retail)       [address] [place], [place]  [address]       Ph: [phone]       Fax: [phone]   RxID:   [id number] ZOCOR 40 MG  TABS (SIMVASTATIN) Take 1 tablet by mouth once a day at bedtime  #90 x 3   Entered and Authorized by:   Nani Gasser MD   Signed by:   Nani Gasser MD on 08/30/2009   Method used:   Printed then faxed to ...       [facility]order[facility]             , [place]         Ph:        Fax: [phone]   RxID:   [id number] ZOLOFT 100 MG TABS (SERTRALINE HCL) 2 tabs by mouth daily.  #180 x 3   Entered and Authorized by:   Nani Gasser MD   Signed by:   Nani Gasser MD on 08/30/2009   Method used:   Printed then faxed to ...       [facility] (mail[facility]order)             , [place]         Ph:  Fax: [phone]   RxID:   [id number]

## 2010-06-03 NOTE — Progress Notes (Signed)
Summary: Not feeling any better  Phone Note Call from Patient Call back at Home Phone [phone]   Caller: Patient Call For: Nani Gasser MD Summary of Call: Pt states not feeling any better- dry cough, still with head and chest congestion and hoarse. Said to call if no better on Monday. Uses [facility] [address] [facility] [place] Initial call taken by: Kathlene November,  May 06, 2009 11:45 AM  Follow-up for Phone Call        New rx sent.  Follow-up by: Nani Gasser MD,  May 06, 2009 12:00 PM  Additional Follow-up for Phone Call Additional follow up Details #1::        Pt notified of med sent. Additional Follow-up by: Kathlene November,  May 06, 2009 12:09 PM    New/Updated Medications: CEFDINIR 300 MG CAPS (CEFDINIR) 2 tabs by mouth daily for 7 days Prescriptions: CEFDINIR 300 MG CAPS (CEFDINIR) 2 tabs by mouth daily for 7 days  #14 x 0   Entered and Authorized by:   Nani Gasser MD   Signed by:   Nani Gasser MD on 05/06/2009   Method used:   Electronically to        [facility] [phone]* (retail)       [address] [place], [place]  [address]       Ph: [phone]       Fax: [phone]   RxID:   [id number]

## 2010-06-03 NOTE — Letter (Signed)
Summary: Behavioral Health Note  Behavioral Health Note   Imported By: Kathlene November 12/13/2007 11:26:56  _____________________________________________________________________  External Attachment:    Type:   Image     Comment:   External Document

## 2010-06-03 NOTE — Progress Notes (Signed)
Summary: Meprobamate authorization  Phone Note Call from Patient Call back at Home Phone [phone]   Caller: Patient Summary of Call: Patient will bring copy of letter from insurance regarding authorization of meprobamate.   Initial call taken by: Leonette Monarch Site Manager,  May 29, 2008 11:36 AM

## 2010-06-03 NOTE — Letter (Signed)
Summary: Results Letter   Gastroenterology  [address] [place], [place] [address]   Phone: [phone]  Fax: [phone]        April 04, 2010 MRN: [id number]    [facility] [address] [place], [place]  [address]    Dear Ms. Lazard,  Your biopsy results did not show any remarkable findings.  Please continue with the recommendations previously discussed.  Please schedule a followup office appointment.  Sincerely,  Barbette Hair. Arlyce Dice, M.D., [facility]          Sincerely,  Louis Meckel MD  This letter has been electronically signed by your physician.  Appended Document: Results Letter LETTER MAILED

## 2010-06-03 NOTE — Assessment & Plan Note (Signed)
Summary: SWOLLEN GLAND BEHIND EAR, allergic rhinitis   Vital Signs:  Patient Profile:   76 Years Old Female Height:     67.25 inches Weight:      187 pounds Temp:     97.5 degrees F oral Pulse rate:   71 / minute BP sitting:   111 / 65  (left arm) Cuff size:   regular  Vitals Entered By: Kathlene November (Sep 23, 2006 11:36 AM)               PCP:  Cipriano Bunker  Chief Complaint:  swollen gland behind right ear since Friday. Now pt states starting to burn.  History of Present Illness: Tender gland on right side, behind ear.  Has gotten larger over last week.  Past 3 days starts to burn.  No ear pain.  Had work done on right upper molar (has been painful).  Had a tooth extraction.  Nasal congestion.Sneezing, watery and itchy eyes. Just refilled Atarax but hasn't picked it up. Allergy to insect venom.  Used to take allergy shot for the insect venom.     Current Allergies: ! KEFLEX ! SULFA ! * INSECT VENOM ! * SHELLFISH      Physical Exam  General:     Well-developed,well-nourished,in no acute distress; alert,appropriate and cooperative throughout examination Head:     Normocephalic and atraumatic without obvious abnormalities. No apparent alopecia or balding. Eyes:     No corneal or conjunctival inflammation noted. EOMI. Perrla.  Ears:     External ear exam shows no significant lesions or deformities.  Otoscopic examination reveals clear canals, tympanic membranes are intact bilaterally without bulging, retraction, inflammation or discharge. Hearing is grossly normal bilaterally. Nose:     External nasal examination shows no deformity or inflammation.  Neck:     Small 0.3cm node on post right cervical area.  Mildly tender over area.   Lungs:     Normal respiratory effort, chest expands symmetrically. Lungs are clear to auscultation, no crackles or wheezes. Heart:     Normal rate and regular rhythm. S1 and S2 normal without gallop, murmur, click, rub or other extra sounds.  Psych:     Cognition and judgment appear intact. Alert and cooperative with normal attention span and concentration. No apparent delusions, illusions, hallucinations    Impression & Recommendations:  Problem # 1:  SYMPTOM, ENLARGEMENT, LYMPH NODES (ICD-785.6) Call if not resolved in 1-2 weeks. Suspect LN that hopefully will resolve on its own. If not check CBC to start.    Problem # 2:  RHINITIS, ALLERGIC NOS (ICD-477.9) Gave a sample of Nasonex. Demonstrated use. Call if not helping after one week.  Her updated medication list for this problem includes:    Loratadine 10 Mg Tabs (Loratadine) ..... One by mouth daily prn

## 2010-06-03 NOTE — Medication Information (Signed)
Summary: Clarification for Metoprolol Tartrate/[facility]  Clarification for Metoprolol Tartrate/[facility]   Imported By: Lanelle Bal 02/12/2009 12:51:19  _____________________________________________________________________  External Attachment:    Type:   Image     Comment:   External Document

## 2010-06-03 NOTE — Letter (Signed)
Summary: Anxiety Questionnaire/[place] [facility]  Anxiety Questionnaire/[place] [facility]   Imported By: Lanelle Bal 07/24/2009 14:15:53  _____________________________________________________________________  External Attachment:    Type:   Image     Comment:   External Document

## 2010-06-03 NOTE — Progress Notes (Signed)
Summary: call a nurse  Call-A-Nurse Triage Call Report Triage Record Num: [id number] Operator: Dayton Martes Patient Name: Dawn Bailey Call Date & Time: 01/02/2010 9:11:30PM Patient Phone: [phone] PCP: Patient Gender: Female PCP Fax : Patient DOB: 1935/01/10 Practice Name: [facility] Reason for Call: Pt sts that she went to bed and started having chills, headache, body aches, urinary frequency (onset 01/01/10). Denies 911 sx's. Care adv given. RN [facility] ER now. Protocol(s) Used: Urinary Symptoms - Female Recommended Outcome per Protocol: See Provider within 4 hours Reason for Outcome: Any temperature elevation in a frail elderly, immunocompromised or pregnant person Care Advice:  ~ Another adult should drive.  ~ Call provider if symptoms worsen or new symptoms develop.  ~ IMMEDIATE ACTION  ~ List, or take, all current prescription(s), nonprescription or alternative medication(s) to provider for evaluation. Systemic Inflammatory Response Syndrome (SIRS): Watch for signs of a generalized, whole body infection. Occurs within days of a localized infection, especially of the urinary, GI, respiratory or nervous systems; or after a traumatic injury or invasive procedure. - Call EMS 911 if symptoms have worsened, such as increasing confusion or unusual drowsiness; cold and clammy skin; no urine output; rapid respiration (>30/min.) or slow respiration (<10/min.); struggling to breathe. - Go to the ED immediately for early symptoms of rapid pulse >90/min. or rapid breathing >20/min. at rest; chills; oral temperature >100.4 F (38 C) or <96.8 F (36 C) when associated with conditions noted.  ~ 01/02/2010 9:20:30PM Page 1 of 1 [id number]

## 2010-06-03 NOTE — Progress Notes (Signed)
Summary: RE: MEDICATION REFILL  Phone Note Call from Patient Call back at Home Phone [phone]   Reason for Call: Talk to Nurse Summary of Call: PT LEFT MESSAGE STATING THAT SHE WANTS TO KNOW  WHY HER MEDICATION  REFILL WAS DENIED. PLEASE CALL PATIENT. Initial call taken by: Sarina Ill,  December 29, 2006 1:02 PM  Follow-up for Phone Call        Notified pt that med was at pharmacy and was not denied.KJ LPN Follow-up by: Kathlene November,  December 29, 2006 1:08 PM

## 2010-06-03 NOTE — Miscellaneous (Signed)
Summary: Initial Summary/[facility]  Initial Summary/[facility]   Imported By: Lanelle Bal 03/06/2009 09:32:31  _____________________________________________________________________  External Attachment:    Type:   Image     Comment:   External Document

## 2010-06-03 NOTE — Letter (Signed)
Summary: Anxiety Questionnaire/[place] [facility]  Anxiety Questionnaire/[place] [facility]   Imported By: Lanelle Bal 09/09/2009 09:00:14  _____________________________________________________________________  External Attachment:    Type:   Image     Comment:   External Document

## 2010-06-03 NOTE — Procedures (Signed)
Summary: Flexible Sigmoidoscopy  Patient: [name] Note: All result statuses are Final unless otherwise noted.  Tests: (1) Flexible Sigmoidoscopy (FLX)  FLX Flexible Sigmoidoscopy                             DONE     [facility]     [address]     [place], [place]  [address]           FLEXIBLE SIGMOIDOSCOPY PROCEDURE REPORT           PATIENT:  Dawn Bailey, Dawn Bailey  MR#:  [id number]     BIRTHDATE:  [birth year], 75 yrs. old  GENDER:  female           ENDOSCOPIST:  Barbette Hair. Arlyce Dice, MD     Referred by:           PROCEDURE DATE:  03/31/2010     PROCEDURE:  Flexible Sigmoidoscopy with biopsy     ASA CLASS:  Class II     INDICATIONS:  diarrhea           MEDICATIONS:   Fentanyl 75 mcg IV, Versed 8 mg IV           DESCRIPTION OF PROCEDURE:   After the risks benefits and     alternatives of the procedure were thoroughly explained, informed     consent was obtained.  Digital rectal exam was performed and     revealed no abnormalities.   The [id number] endoscope     was introduced through the anus and advanced to the descending     colon (60cm), without limitations.  The quality of the prep was .     The instrument was then slowly withdrawn as the mucosa was fully     examined.     <<PROCEDUREIMAGES>>           Moderate diverticulosis was found in the sigmoid colon.     Questionable mild erythema in descending and sigmoid colon (see     image2). Random biopsies were taken to r/o microscopic colitis     Retroflexed views in the rectum revealed no abnormalities.    The     scope was then withdrawn from the patient and the procedure     terminated.           COMPLICATIONS:  None           ENDOSCOPIC IMPRESSION:     1) Moderate diverticulosis in the sigmoid colon     RECOMMENDATIONS:     1) await biopsy results     2) Call the office to schedule a followup office visit for 1     week           REPEAT EXAM:  No           ______________________________  Barbette Hair. Arlyce Dice, MD           CC:  Nani Gasser, MD           n.     Rosalie Doctor:   Barbette Hair. Miriam Kestler at 03/31/2010 02:32 PM           Jodelle Gross, [id number]  Note: An exclamation mark (!) indicates a result that was not dispersed into the flowsheet. Document Creation Date: 03/31/2010 2:32 PM _______________________________________________________________________  (1) Order result status: Final Collection or observation date-time: 03/31/2010 14:27 Requested date-time:  Receipt date-time:  Reported date-time:  Referring Physician:  Ordering Physician: Melvia Heaps [phone]) Specimen Source:  Source: Launa Grill Order Number: [id number] Lab site:

## 2010-06-03 NOTE — Letter (Signed)
Summary: [facility]  [facility]   Imported By: Lanelle Bal 04/30/2009 12:27:47  _____________________________________________________________________  External Attachment:    Type:   Image     Comment:   External Document

## 2010-06-03 NOTE — Consult Note (Signed)
Summary: [facility]  [facility]   Imported By: Lanelle Bal 08/29/2009 11:55:41  _____________________________________________________________________  External Attachment:    Type:   Image     Comment:   External Document

## 2010-06-03 NOTE — Assessment & Plan Note (Signed)
Summary: 1 MONTH F/u Panic attacks, migraines, hearing loss.    Vital Signs:  Patient profile:   76 year old female Height:      67.25 inches Weight:      208 pounds Pulse rate:   81 / minute BP sitting:   114 / 63  (left arm) Cuff size:   regular  Vitals Entered By: Kathlene November (July 19, 2009 10:59 AM) CC: followup on Zoloft- pt states med is working well for her 40db HL: Left  500 hz: No Response 1000 hz: 40db 2000 hz: 40db 4000 hz: No Response Right  500 hz: No Response 1000 hz: 40db 2000 hz: 40db 4000 hz: 40db    Primary Care Provider:  Linford Arnold, C  CC:  followup on Zoloft- pt states med is working well for her.  History of Present Illness: followup on Zoloft- pt states med is working well for her. She is up to a whole tab. Initally was having some sxs (see phone note) but by the next day felt better. She thinks it was withdrawing from the effoxor.  Feels her sleep pattern is better. Fewer nightmares. Denies and SE from the medication. Thus far feels it iw working better than the effexor.  Trying to replace the meprobromate with the flexeril and the alprazolam together. This has worked out well so will need new quantities.    Noticed some hearing loss more recently. Would like to have her hearing tested.    Current Medications (verified): 1)  Enalapril Maleate 5 Mg Tabs (Enalapril Maleate) .... Take 1 Tablet By Mouth Once A Day 2)  Metoprolol Tartrate 25 Mg  Tabs (Metoprolol Tartrate) .... Take 1 Tablet By Mouth Two Times A Day 3)  Zocor 40 Mg  Tabs (Simvastatin) .... Take 1 Tablet By Mouth Once A Day At Bedtime 4)  Alprazolam 0.5 Mg  Tabs (Alprazolam) .... Take 1 Tablet By Mouth Once A Day 5)  Trazodone Hcl 50 Mg  Tabs (Trazodone Hcl) .... Take 1 Tablet By Mouth Once A Day At Bedtime 6)  Flexeril 10 Mg Tabs (Cyclobenzaprine Hcl) .... Take 1 Tablet By Mouth Tid Prn Muscle Spasm. 7)  Sertraline Hcl 50 Mg Tabs (Sertraline Hcl) .... Take One Tablet By Mouth Once A  Day  Allergies (verified): 1)  ! Keflex 2)  ! Sulfa 3)  ! * Insect Venom 4)  ! * Shellfish  Comments:  Nurse/Medical Assistant: The patient's medications and allergies were reviewed with the patient and were updated in the Medication and Allergy Lists. Kathlene November (July 19, 2009 11:00 AM)  Physical Exam  General:  Well-developed,well-nourished,in no acute distress; alert,appropriate and cooperative throughout examination Ears:  Mild amout of wax, no blockage. TMS look great.    Impression & Recommendations:  Problem # 1:  PANIC ATTACKS (ICD-300.01) Overall improved wtih the setraline. Discussed that since her GAD-7 score is  17, recommend increase the sertraline to 100mg  dialy. F/U in 6 weeks. Call if any SE.   Also refill xanax and trazodone.  Her updated medication list for this problem includes:    Alprazolam 0.5 Mg Tabs (Alprazolam) .Marland Kitchen... Take 1 tablet by mouth two times a day as needed    Trazodone Hcl 50 Mg Tabs (Trazodone hcl) .Marland Kitchen... Take 1 tablet by mouth once a day at bedtime    Sertraline Hcl 100 Mg Tabs (Sertraline hcl) .Marland Kitchen... Take 1 tablet by mouth once a day  Problem # 2:  MIGRAINE, UNSPEC., W/O INTRACTABLE MIGRAINE (ICD-346.90) I think the  new regimen of the xanax plus flexeril is thus far working well for her. This is great since her insurance no longer covers the meprobromate.  Her updated medication list for this problem includes:    Metoprolol Tartrate 25 Mg Tabs (Metoprolol tartrate) .Marland Kitchen... Take 1 tablet by mouth two times a day  Her updated medication list for this problem includes:    Metoprolol Tartrate 25 Mg Tabs (Metoprolol tartrate) .Marland Kitchen... Take 1 tablet by mouth two times a day  Problem # 3:  UNSPECIFIED HEARING LOSS (ICD-389.9)  Will doe hearing screen today. She does have some wax in her ear but not blocking. FAiled screen. Will refer for audiology.   Orders: Audiology (Audio) Hearing Screening ([address])  Complete Medication List: 1)  Enalapril  Maleate 5 Mg Tabs (Enalapril maleate) .... Take 1 tablet by mouth once a day 2)  Metoprolol Tartrate 25 Mg Tabs (Metoprolol tartrate) .... Take 1 tablet by mouth two times a day 3)  Zocor 40 Mg Tabs (Simvastatin) .... Take 1 tablet by mouth once a day at bedtime 4)  Alprazolam 0.5 Mg Tabs (Alprazolam) .... Take 1 tablet by mouth two times a day as needed 5)  Trazodone Hcl 50 Mg Tabs (Trazodone hcl) .... Take 1 tablet by mouth once a day at bedtime 6)  Flexeril 10 Mg Tabs (Cyclobenzaprine hcl) .... Take 1 tablet by mouth tid prn muscle spasm. 7)  Sertraline Hcl 100 Mg Tabs (Sertraline hcl) .... Take 1 tablet by mouth once a day  Patient Instructions: 1)  Please schedule a follow-up appointment in 6 weeks.  Prescriptions: SERTRALINE HCL 100 MG TABS (SERTRALINE HCL) Take 1 tablet by mouth once a day  #30 x 2   Entered and Authorized by:   Nani Gasser MD   Signed by:   Nani Gasser MD on 07/19/2009   Method used:   Printed then faxed to ...       [address] [phone]* (retail)       [address] [place], [place]  [address]       Ph: [phone]       Fax: [phone]   RxID:   [id number] ALPRAZOLAM 0.5 MG  TABS (ALPRAZOLAM) Take 1 tablet by mouth two times a day as needed  #180 x 0   Entered and Authorized by:   Nani Gasser MD   Signed by:   Nani Gasser MD on 07/19/2009   Method used:   Printed then faxed to ...       [address] [phone]* (retail)       [address] [place], [place]  [address]       Ph: [phone]       Fax: [phone]   RxID:   [id number] TRAZODONE HCL 50 MG  TABS (TRAZODONE HCL) Take 1 tablet by mouth once a day at bedtime  #90 Each x 0   Entered and Authorized by:   Nani Gasser MD   Signed by:   Nani Gasser MD on 07/19/2009   Method used:   Electronically to        [facility] [phone]* (retail)       [address] [place], [place]  [address]       Ph: [phone]       Fax: [phone]    RxID:   [id number]

## 2010-06-03 NOTE — Progress Notes (Signed)
Summary: Still not better after completed course of omnicef  Phone Note Call from Patient Call back at Home Phone [phone]   Caller: Patient Call For: Nani Gasser MD Summary of Call: Pt calls and finished antibiotic yesterday feeling a little better but still head and chest congestion with green phlegm and wonders what she should do Initial call taken by: Kathlene November,  May 13, 2009 1:23 PM  Follow-up for Phone Call        We can try changint to a floroquinolone thought the omnicef has really good coverage.  Follow-up by: Nani Gasser MD,  May 13, 2009 1:37 PM  Additional Follow-up for Phone Call Additional follow up Details #1::        Pt notified med sent to pharamcy Additional Follow-up by: Kathlene November,  May 13, 2009 1:49 PM    New/Updated Medications: LEVAQUIN 750 MG TABS (LEVOFLOXACIN) Take 1 tablet by mouth once a day x 5 days Prescriptions: LEVAQUIN 750 MG TABS (LEVOFLOXACIN) Take 1 tablet by mouth once a day x 5 days  #5 x 0   Entered and Authorized by:   Nani Gasser MD   Signed by:   Nani Gasser MD on 05/13/2009   Method used:   Electronically to        [facility] [phone]* (retail)       [address] [place], [place]  [address]       Ph: [phone]       Fax: [phone]   RxID:   [id number]

## 2010-06-03 NOTE — Progress Notes (Signed)
Summary: referral to ortho  Phone Note Call from Patient Call back at Home Phone [phone]   Caller: Patient Call For: Nani Gasser MD Summary of Call: Pt calls and back went out again and would like to see Dr. Shon Baton in ortho for it. Initial call taken by: Kathlene November,  December 31, 2008 9:47 AM  Follow-up for Phone Call        Will refer.  Follow-up by: Nani Gasser MD,  December 31, 2008 12:15 PM

## 2010-06-03 NOTE — Assessment & Plan Note (Signed)
Summary: Mole removal-   Vital Signs:  Patient profile:   76 year old female Height:      67.25 inches Weight:      205 pounds Pulse rate:   90 / minute BP sitting:   117 / 76  (right arm) Cuff size:   regular  Vitals Entered By: Avon Gully CMA, Duncan Dull) (January 24, 2010 10:31 AM) CC: remove suspicious appearing lesion   Primary Care Provider:  Nani Gasser MD  CC:  remove suspicious appearing lesion.  History of Present Illness: remove suspicious appearing lesion.  Current Medications (verified): 1)  Enalapril Maleate 5 Mg Tabs (Enalapril Maleate) .... Take 1 Tablet By Mouth Once A Day 2)  Metoprolol Tartrate 25 Mg  Tabs (Metoprolol Tartrate) .... Take 1 Tablet By Mouth Two Times A Day 3)  Zocor 40 Mg  Tabs (Simvastatin) .... Take 1 Tablet By Mouth Once A Day At Bedtime 4)  Alprazolam 0.5 Mg  Tabs (Alprazolam) .... Take 1 Tablet By Mouth Two Times A Day As Needed 5)  Trazodone Hcl 50 Mg  Tabs (Trazodone Hcl) .... Take 1 Tablet By Mouth Once A Day At Bedtime 6)  Zoloft 100 Mg Tabs (Sertraline Hcl) .... 2 Tabs By Mouth Daily. 7)  Imodium A-D 2 Mg Tabs (Loperamide Hcl) .... As Needed 8)  Promethazine Hcl 25 Mg  Tabs (Promethazine Hcl) .... Sig 1 By Mouth Q 6-8hrs Prn 9)  Ciprofloxacin Hcl 500 Mg Tabs (Ciprofloxacin Hcl) .... Take 1 Tablet By Mouth Two Times A Day For 3 Days 10)  Meprobamate 400 Mg Tabs (Meprobamate) .... Take 1 Tablet By Mouth Once A Day As Needed For Migraine.  Allergies (verified): 1)  ! Keflex 2)  ! Sulfa 3)  ! * Insect Venom 4)  ! * Shellfish 5)  Abilify (Aripiprazole)  Comments:  Nurse/Medical Assistant: The patient's medications and allergies were reviewed with the patient and were updated in the Medication and Allergy Lists. Avon Gully CMA, Duncan Dull) (January 24, 2010 10:32 AM)  Physical Exam  General:  Well-developed,well-nourished,in no acute distress; alert,appropriate and cooperative throughout examination   Complete  Medication List: 1)  Enalapril Maleate 5 Mg Tabs (Enalapril maleate) .... Take 1 tablet by mouth once a day 2)  Metoprolol Tartrate 25 Mg Tabs (Metoprolol tartrate) .... Take 1 tablet by mouth two times a day 3)  Zocor 40 Mg Tabs (Simvastatin) .... Take 1 tablet by mouth once a day at bedtime 4)  Alprazolam 0.5 Mg Tabs (Alprazolam) .... Take 1 tablet by mouth two times a day as needed 5)  Trazodone Hcl 50 Mg Tabs (Trazodone hcl) .... Take 1 tablet by mouth once a day at bedtime 6)  Zoloft 100 Mg Tabs (Sertraline hcl) .... 2 tabs by mouth daily. 7)  Imodium A-d 2 Mg Tabs (Loperamide hcl) .... As needed 8)  Promethazine Hcl 25 Mg Tabs (Promethazine hcl) .... Sig 1 by mouth q 6-8hrs prn 9)  Ciprofloxacin Hcl 500 Mg Tabs (Ciprofloxacin hcl) .... Take 1 tablet by mouth two times a day for 3 days 10)  Meprobamate 400 Mg Tabs (Meprobamate) .... Take 1 tablet by mouth once a day as needed for migraine.  Other Orders: Admin 1st Vaccine ([address]) Flu Vaccine 75yrs + ([address]) Shave Skin Lesion < 0.5 cm/trunk/arm/leg ([address]) Flu Vaccine Consent Questions     Do you have a history of severe allergic reactions to this vaccine? no    Any prior history of allergic reactions to egg and/or gelatin? no  Do you have a sensitivity to the preservative Thimersol? no    Do you have a past history of Guillan-Barre Syndrome? no    Do you currently have an acute febrile illness? no    Have you ever had a severe reaction to latex? no    Vaccine information given and explained to patient? yes    Are you currently pregnant? no    Lot Number:[id number]   Exp Date:11/01/2010   Site Given  Left Deltoid IM  Patient Instructions: 1)  Apply vaseline and bandaid daily until healed. No alcohol or peroxide on the wound.  Call if any sign of infection. Can wash off in the shower.  Marland Kitchenlbflu   Procedure Note Last Tetanus: Td (07/03/2003)  Mole Biopsy/Removal:    Size (in cm): 0.4 x 0.4    Region: left forearm.    Biopsy: Biopsy Type: Skin Indication: suspicious lesion Consent signed: no  Procedure # 1: shave biopsy    Size (in cm): 0.4 x 0.4    Region: dorsal    Location: left forearm    Comment: lot #[id number]   exp 11/12 Aluminum chlorid solution    Instrument used: double blade.     Anesthesia: 2.0 ml 1% lidocaine w/epinephrine  Cleaned and prepped with: alcohol and betadine Wound dressing: bandaid  Appended Document: Mole removal- Call pt: Lesion was a irritated seborrheaic keratosis. Benign. September 27, 201110:30 AM  Linford Arnold MD, [facility]   January 28, 2010 11:07 AM McCrimmon CMA, (AAMA), Sue Lush spoke with pt and notified of results

## 2010-06-03 NOTE — Assessment & Plan Note (Signed)
Summary: DISCUSS MEDICATION   Vital Signs:  Patient Profile:   76 Years Old Female Height:     67.25 inches Weight:      200 pounds BMI:     31.20 Pulse rate:   65 / minute BP sitting:   109 / 68  (left arm) Cuff size:   large  Vitals Entered By: Harlene Salts (June 15, 2006 8:30 AM)               PCP:  Cipriano Bunker  Chief Complaint:  Discuss meds..  History of Present Illness: In Study at [facility], to help prevent diabetes Diabetes.  Has lost 17 pounds with weekly nutrition counseling. Keeps a diary. Using a podometer and increasing goals weekly.  Hasn't been to classes last 3 weeks, but will go back this Thrs.    Getting more migraines. Has several while in [place] helping her daughter after an accident. Stress level increased lately.    On Effexor 150mg  XL and wants to decrease dose.  Husband is having severe mood swings.  Gets very very angry at times.  She is concerned about his mental health.    Hypertension History:      She denies headache, chest pain, palpitations, dyspnea with exertion, orthopnea, PND, peripheral edema, visual symptoms, neurologic problems, syncope, and side effects from treatment.  She notes no problems with any antihypertensive medication side effects.        Positive major cardiovascular risk factors include female age 55 years old or older, hyperlipidemia, and hypertension.  Negative major cardiovascular risk factors include no history of diabetes and non-tobacco-user status.        Further assessment for target organ damage reveals no history of ASHD, cardiac end-organ damage (CHF/LVH), stroke/TIA, peripheral vascular disease, renal insufficiency, or hypertensive retinopathy.     Prior Medications: HYDROXYZINE HCL 25 MG/ML SOLN (HYDROXYZINE HCL) one by mouth prn BUSPAR 15 MG TABS (BUSPIRONE HCL) () Take 1 tablet by mouth once a day EFFEXOR XR 150 MG CP24 (VENLAFAXINE HCL) Take 1 tablet by mouth once a day ENALAPRIL MALEATE 5 MG TABS  (ENALAPRIL MALEATE) Take 1 tablet by mouth once a day LORATADINE 10 MG TABS (LORATADINE) one by mouth daily prn METOPROLOL TARTRATE 50 MG TABS (METOPROLOL TARTRATE) one by mouth twice daily ZOCOR 20 MG TABS (SIMVASTATIN) Take 1 tablet by mouth once a day MEPROBAMATE 400 MG TABS (MEPROBAMATE) one by mouth  2-3 times a day. Current Allergies: ! KEFLEX ! SULFA ! * INSECT VENOM ! * SHELLFISH      Physical Exam  General:     Well-developed,well-nourished,in no acute distress; alert,appropriate and cooperative throughout examination Head:     Normocephalic and atraumatic without obvious abnormalities. No apparent alopecia or balding. Lungs:     Normal respiratory effort, chest expands symmetrically. Lungs are clear to auscultation, no crackles or wheezes. Heart:     Normal rate and regular rhythm. S1 and S2 normal without gallop, murmur, click, rub or other extra sounds.  No carotid bruits.  Psych:     Cognition and judgment appear intact. Alert and cooperative with normal attention span and concentration. No apparent delusions, illusions, hallucinations    Impression & Recommendations:  Problem # 1:  MIGRAINE, UNSPEC., W/O INTRACTABLE MIGRAINE (ICD-346.90) If frequency doesn't imrpve over next month consider adding migranol. Avoid tyrptans because of potential side effects with her other meds.   Her updated medication list for this problem includes:    Metoprolol Tartrate 50 Mg Tabs (Metoprolol  tartrate) ..... One by mouth twice daily   Problem # 2:  PANIC ATTACKS (ICD-300.01) Change Effexor to generic two times a day. In one month if doing well, will decrease to 50mg  two times a day.  Her updated medication list for this problem includes:    Hydroxyzine Hcl 25 Mg/ml Soln (Hydroxyzine hcl) ..... One by mouth prn    Effexor Xr 150 Mg Cp24 (Venlafaxine hcl) .Marland Kitchen... Take 1 tablet by mouth once a day    Meprobamate 400 Mg Tabs (Meprobamate) ..... One by mouth  2-3 times a day.     Effexor 75 Mg Tabs (Venlafaxine hcl) .Marland Kitchen... Take 1 tablet by mouth two times a day   Problem # 3:  HYPERTENSION, BENIGN SYSTEMIC (ICD-401.1) Well controlled.  Her updated medication list for this problem includes:    Enalapril Maleate 5 Mg Tabs (Enalapril maleate) .Marland Kitchen... Take 1 tablet by mouth once a day    Metoprolol Tartrate 50 Mg Tabs (Metoprolol tartrate) ..... One by mouth twice daily   Problem # 4:  HYPERCHOLESTEROLEMIA (ICD-272.0) Due for labs.  Pt reports had a lot of blood work during study.  Says will get copies and bring to me, o/w needs Lipid panel, CMET.   Her updated medication list for this problem includes:    Zocor 20 Mg Tabs (Simvastatin) .Marland Kitchen... Take 1 tablet by mouth once a day   Medications Added to Medication List This Visit: 1)  Buspar 15 Mg Tabs (buspirone Hcl)  .... Take 1 tablet by mouth once a day 2)  Effexor 75 Mg Tabs (Venlafaxine hcl) .... Take 1 tablet by mouth two times a day  Hypertension Assessment/Plan:      The patient's hypertensive risk group is category B: At least one risk factor (excluding diabetes) with no target organ damage.  Today's blood pressure is 109/68.  Her blood pressure goal is < 140/90.

## 2010-06-03 NOTE — Consult Note (Signed)
Summary: [facility] @ [facility] for [facility] [place]   Imported By: Lanelle Bal 09/14/2008 09:58:45  _____________________________________________________________________  External Attachment:    Type:   Image     Comment:   External Document

## 2010-06-03 NOTE — Progress Notes (Signed)
Summary: depressed  Phone Note Call from Patient Call back at Home Phone [phone]   Caller: Patient Call For: Nani Gasser MD Summary of Call: Pt states really feeling down this week- crying alot and wonders if med could be increased that you gave her. Uses [facility] in [place] Initial call taken by: Kathlene November,  August 02, 2009 8:06 AM  Follow-up for Phone Call        Can increase to 1 and 1/2 tabs daily.  Follow-up by: Nani Gasser MD,  August 02, 2009 8:14 AM    New/Updated Medications: ZOLOFT 100 MG TABS (SERTRALINE HCL) 1 and 1/2 tabs daily Prescriptions: ZOLOFT 100 MG TABS (SERTRALINE HCL) 1 and 1/2 tabs daily  #45 x 1   Entered and Authorized by:   Nani Gasser MD   Signed by:   Nani Gasser MD on 08/02/2009   Method used:   Electronically to        [facility] [phone]* (retail)       [address] [place], [place]  [address]       Ph: [phone]       Fax: [phone]   RxID:   [id number]

## 2010-06-05 NOTE — Assessment & Plan Note (Signed)
Summary: f/u from procedure--ch.   History of Present Illness Visit Type: Follow-up Visit Primary GI MD: Melvia Heaps MD [facility] Primary Provider: Nani Gasser MD Requesting Provider: na Chief Complaint: F/u from flex sig. Pt c/o fecal incontinence History of Present Illness:   Dawn Bailey has returned for followup of her diarrhea.  She has episodic severe diarrhea with occasional incontinence.  Recent sigmoidoscopy with biopsies was negative except for diverticulosis.  There is been no change in medications for over a year.  She believes that over rippened fruits may cause symptoms.  She is unaware of any other specific foods that may trigger her symptoms.   GI Review of Systems      Denies abdominal pain, acid reflux, belching, bloating, chest pain, dysphagia with liquids, dysphagia with solids, heartburn, loss of appetite, nausea, vomiting, vomiting blood, weight loss, and  weight gain.      Reports fecal incontinence.     Denies anal fissure, black tarry stools, change in bowel habit, constipation, diarrhea, diverticulosis, heme positive stool, hemorrhoids, irritable bowel syndrome, jaundice, light color stool, liver problems, rectal bleeding, and  rectal pain.    Current Medications (verified): 1)  Enalapril Maleate 5 Mg Tabs (Enalapril Maleate) .... Take 1 Tablet By Mouth Once A Day 2)  Metoprolol Tartrate 25 Mg  Tabs (Metoprolol Tartrate) .... Take 1 Tablet By Mouth Two Times A Day 3)  Zocor 40 Mg  Tabs (Simvastatin) .... Take 1 Tablet By Mouth Once A Day At Bedtime 4)  Alprazolam 0.5 Mg  Tabs (Alprazolam) .... Take 1 Tablet By Mouth Two Times A Day As Needed 5)  Trazodone Hcl 50 Mg  Tabs (Trazodone Hcl) .... Take 1 Tablet By Mouth Once A Day At Bedtime 6)  Zoloft 100 Mg Tabs (Sertraline Hcl) .... 2 Tabs By Mouth Daily. 7)  Imodium A-D 2 Mg Tabs (Loperamide Hcl) .... As Needed 8)  Meprobamate 400 Mg Tabs (Meprobamate) .... Take 1 Tablet By Mouth Once A Day As Needed For  Migraine. 9)  Levbid 0.375 Mg Xr12h-Tab (Hyoscyamine Sulfate) .... As Needed 10)  Cyclobenzaprine Hcl 10 Mg Tabs (Cyclobenzaprine Hcl) .... Take 1 Tablet By Mouth Three Times A Day As Needed Muscle Pain  Allergies (verified): 1)  ! Keflex 2)  ! Sulfa 3)  ! * Insect Venom 4)  ! * Shellfish 5)  Abilify (Aripiprazole)  Past History:  Past Medical History: Hx of Depression  Hx Seasonal Allergies IBS (ICD-564.1) UNSPECIFIED HEARING LOSS (ICD-389.9) LUMBAR SPRAIN AND STRAIN (ICD-847.2) BACK STRAIN, THORACIC (ICD-847.1) NEVUS, ATYPICAL (ICD-216.9) GASTRIC ULCER (ICD-531.90) RHINITIS, ALLERGIC NOS (ICD-477.9) PANIC ATTACKS (ICD-300.01) OSTEOPENIA (ICD-733.90) MIGRAINE, UNSPEC., W/O INTRACTABLE MIGRAINE (ICD-346.90) HYPERTENSION, BENIGN SYSTEMIC (ICD-401.1) HYPERCHOLESTEROLEMIA (ICD-272.0) Obesity Urinary Tract Infection Hx of Anemia Diverticulosis-Colonoscopy 2009 Hemorrhoids  Past Surgical History: Reviewed history from 10/09/2009 and no changes required. Cataract surgery Bilat - 2004  RUQ U/S 10/06- negative  Family History: Reviewed history from 02/24/2010 and no changes required. Brother-HTN  Father - DM, HTN, stroke  Mother - Depression No FH of Colon Cancer:  Social History: Reviewed history from 02/24/2010 and no changes required. Married to [place] with 2 children ages 35, 13.  LMP greater than 10 yrs ago.   Quit smoking 1997.   3# EtOH per week  Walks 20 min per day.  No longer sexually active. Daily Caffeine Use: one daily   Review of Systems  The patient denies allergy/sinus, anemia, anxiety-new, arthritis/joint pain, back pain, blood in urine, breast changes/lumps, change in vision, confusion, cough, coughing  up blood, depression-new, fainting, fatigue, fever, headaches-new, hearing problems, heart murmur, heart rhythm changes, itching, menstrual pain, muscle pains/cramps, night sweats, nosebleeds, pregnancy symptoms, shortness of breath, skin rash, sleeping  problems, sore throat, swelling of feet/legs, swollen lymph glands, thirst - excessive , urination - excessive , urination changes/pain, urine leakage, vision changes, and voice change.    Vital Signs:  Patient profile:   76 year old female Height:      67.25 inches Weight:      194 pounds BMI:     30.27 BSA:     2.00 Pulse rate:   72 / minute Pulse rhythm:   regular BP sitting:   124 / 68  (left arm) Cuff size:   regular  Vitals Entered By: Ok Anis CMA (May 15, 2010 10:01 AM)   Impression & Recommendations:  Problem # 1:  IBS (ICD-564.1) Assessment Unchanged Diarrhea it is likely related to IBS and specifically related to certain triggers.  Unfortunately, these triggers are not well defined.  Recommendations #1 I carefully instructed the patient to keep a  fooddiary when she has the severe episodes of diarrhea. #2 Pepto-Bismol and/or Imodium for severe diarrhea  Patient Instructions: 1)  Copy sent to : Nani Gasser MD 2)  You will need to follow up in 3 months 3)  The medication list was reviewed and reconciled.  All changed / newly prescribed medications were explained.  A complete medication list was provided to the patient / caregiver.

## 2010-06-06 NOTE — Medication Information (Signed)
Summary: Prior Authorization & Denial for Meprobamate/[facility]  Prior Authorization & Denial for Meprobamate/[facility]   Imported By: Lanelle Bal 06/27/2008 14:45:37  _____________________________________________________________________  External Attachment:    Type:   Image     Comment:   External Document

## 2010-06-13 ENCOUNTER — Telehealth: Payer: Self-pay | Admitting: Family Medicine

## 2010-06-25 NOTE — Progress Notes (Signed)
Summary: migraine  Phone Note Call from Patient   Caller: Patient Call For: Nani Gasser MD Summary of Call: pt would like a rx for phenergan because she has a migrain. [facility] in [place] Initial call taken by: Avon Gully CMA, Duncan Dull),  June 13, 2010 1:06 PM  Follow-up for Phone Call        Rx Called In Follow-up by: Nani Gasser MD,  June 13, 2010 1:17 PM    New/Updated Medications: PROMETHAZINE HCL 25 MG TABS (PROMETHAZINE HCL) one by mouth every 6 hours as needed nausea Prescriptions: PROMETHAZINE HCL 25 MG TABS (PROMETHAZINE HCL) one by mouth every 6 hours as needed nausea  #10 x 0   Entered and Authorized by:   Nani Gasser MD   Signed by:   Nani Gasser MD on 06/13/2010   Method used:   Electronically to        [facility] [phone]* (retail)       [address] [place], [place]  [address]       Ph: [phone]       Fax: [phone]   RxID:   [id number]

## 2010-07-07 ENCOUNTER — Telehealth: Payer: Self-pay | Admitting: Family Medicine

## 2010-07-12 IMAGING — CR DG THORACIC SPINE 3V
3 series · 3 of 3 positions shown · non-contrast
Comparison: None

CLINICAL DATA: Back pain.

THORACIC SPINE - 2 VIEW + SWIMMERS

[view not recorded (1 of 3)]
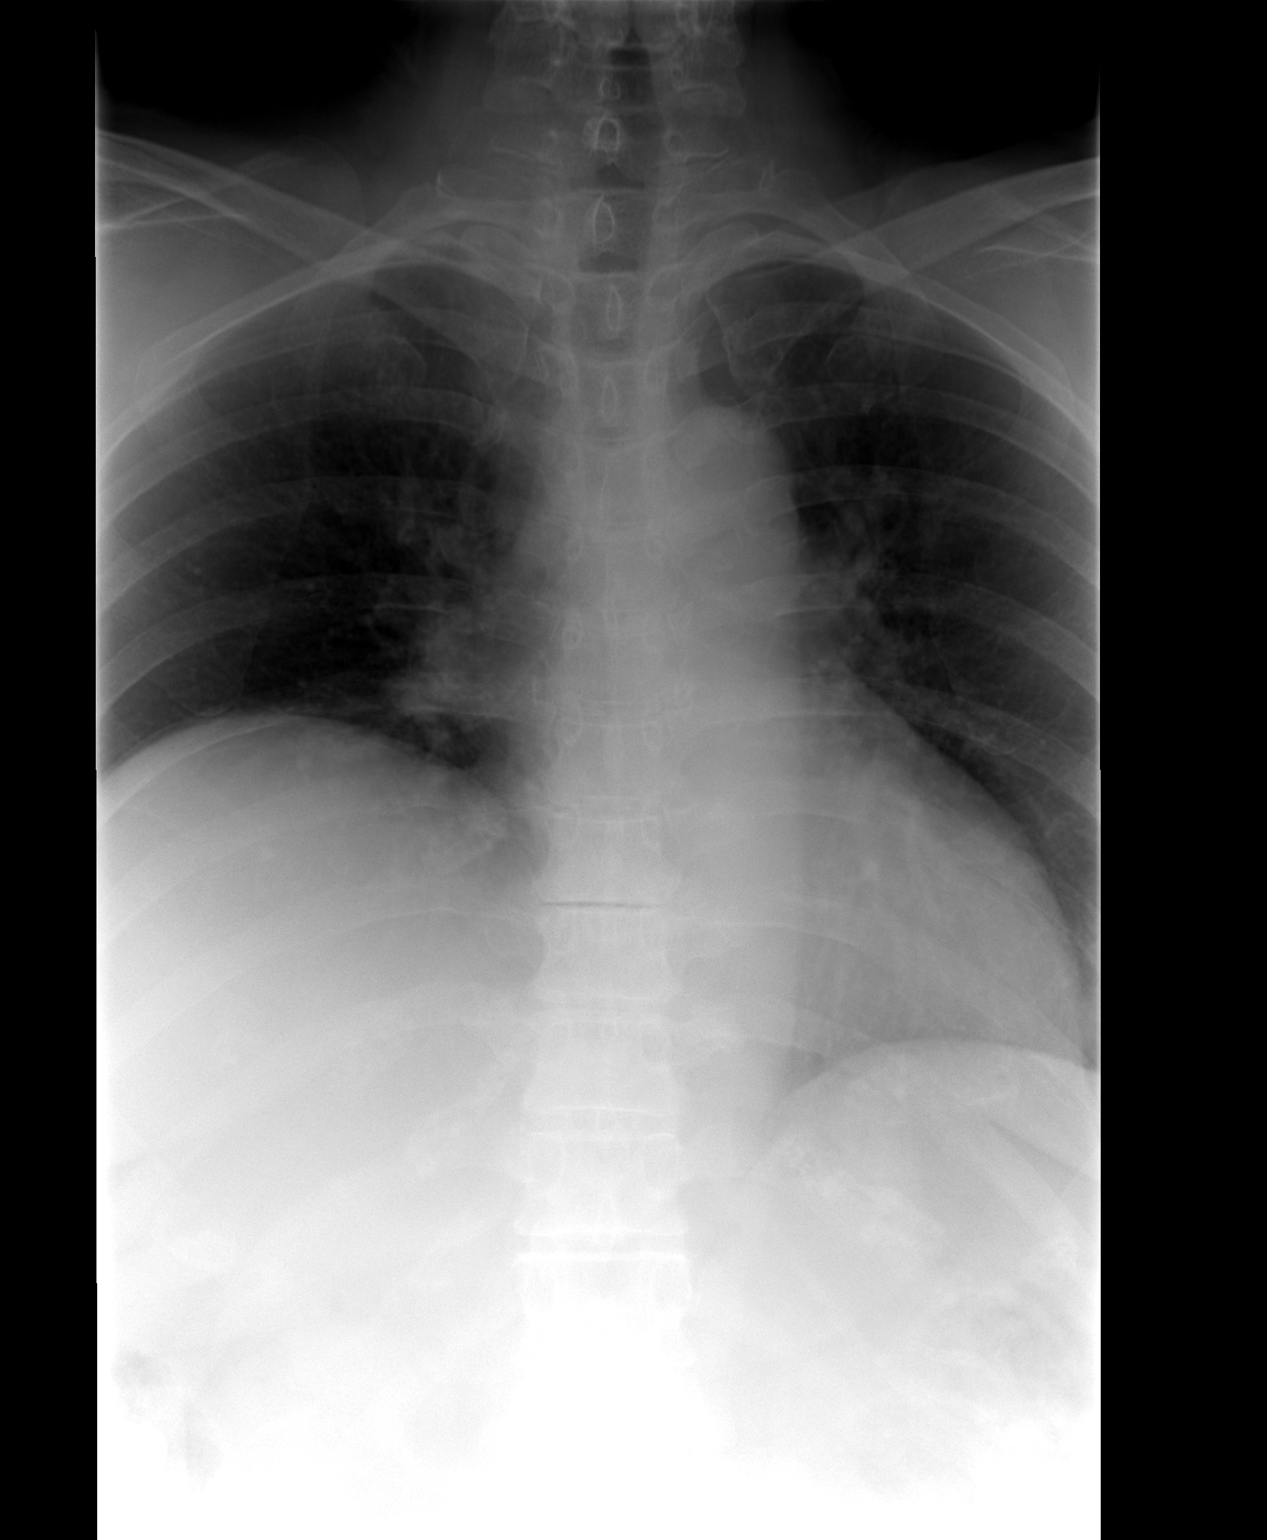

[view not recorded (2 of 3)]
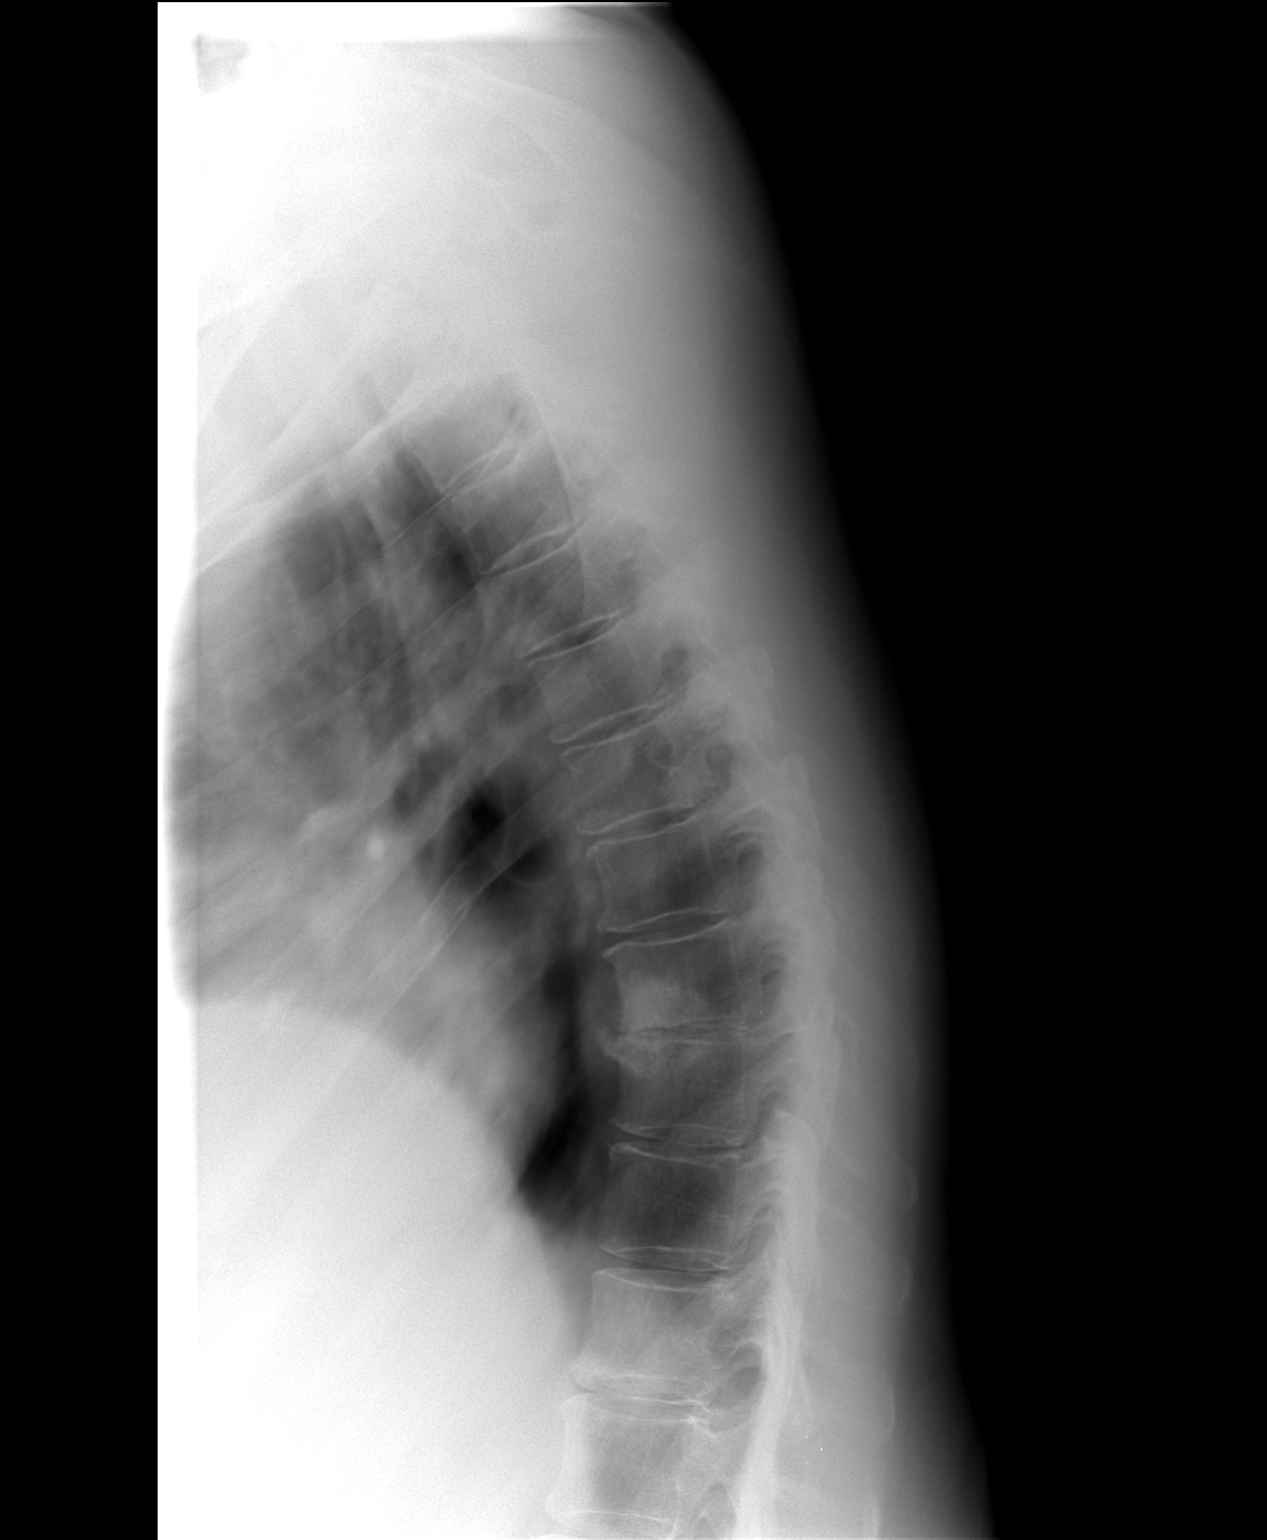

[view not recorded (3 of 3)]
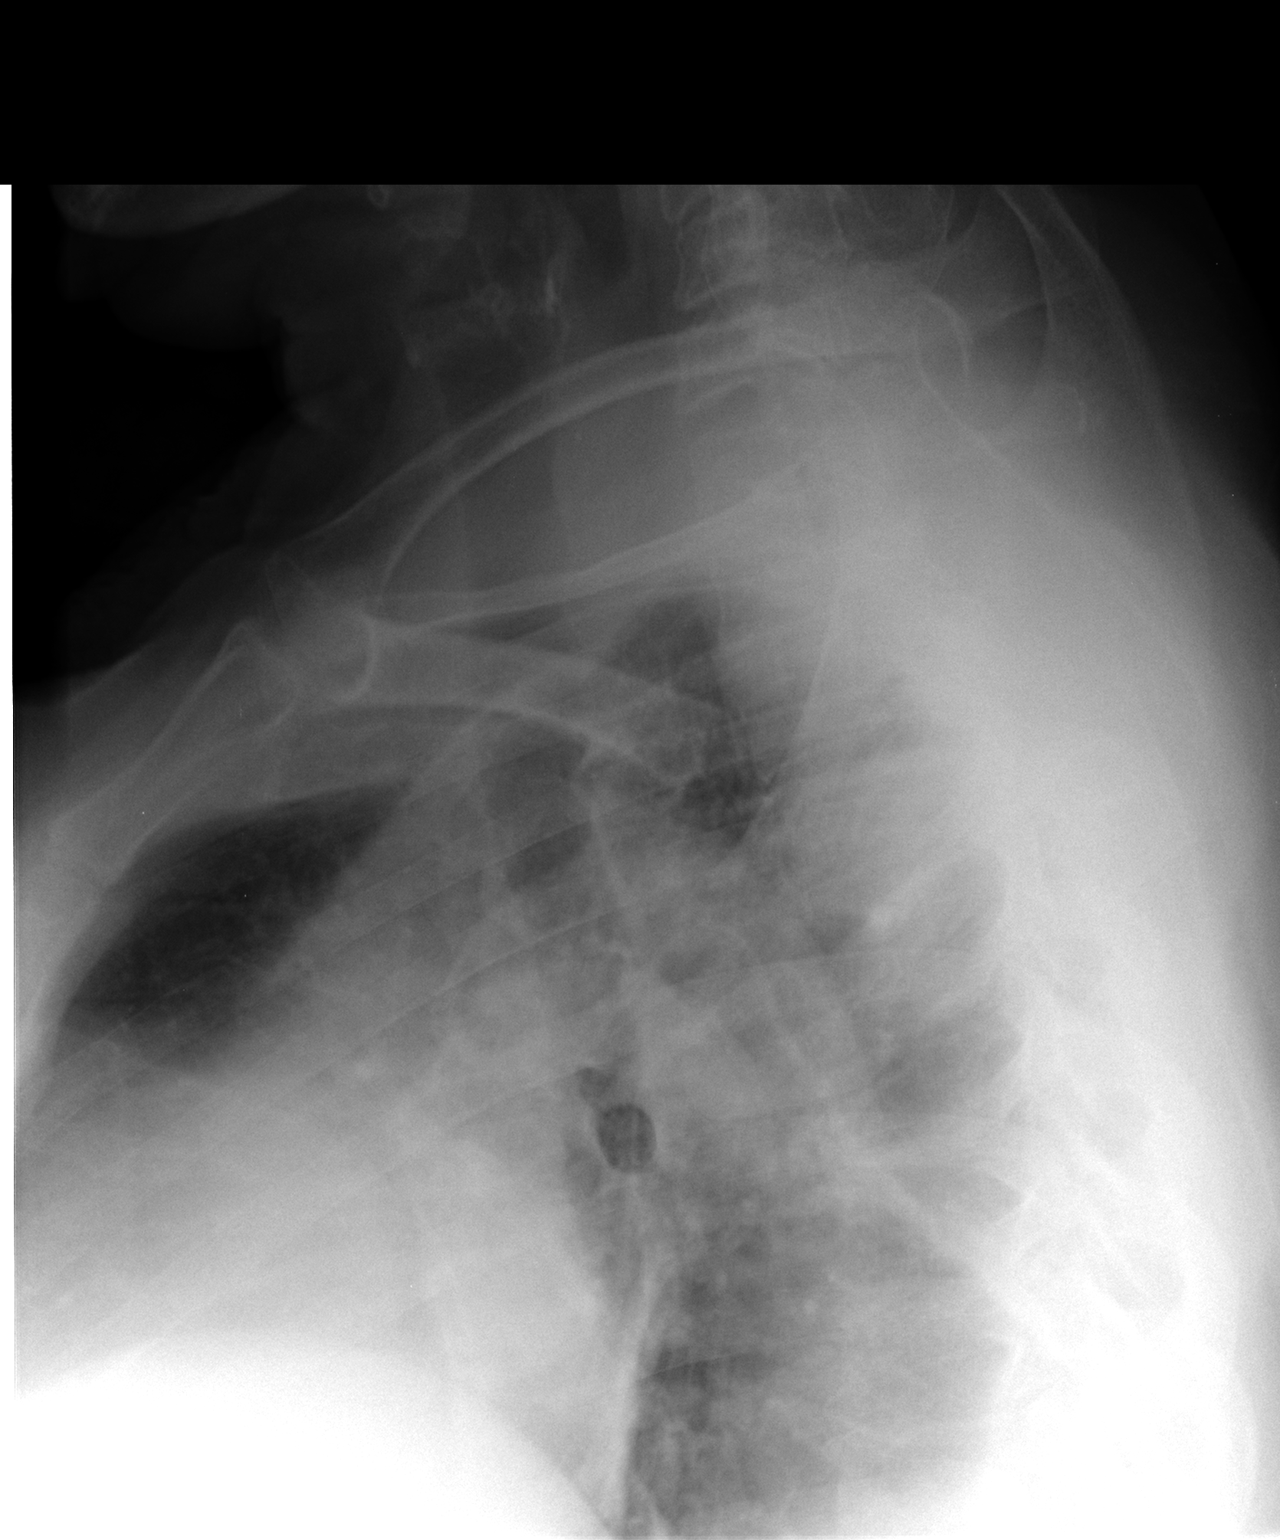

[3 of 3 positions shown; findings below may reference images not displayed]

FINDINGS: Anterior disc space narrowing, sclerosis, and osteophytic
formation at T8-9 secondary to degenerative disc disease.  Milder
degree of degenerative disease at T11-12.  No spondylolisthesis.
IMPRESSION: Advanced DDD at T8-9.

## 2010-07-15 NOTE — Progress Notes (Signed)
Summary: [facility] Refills  Phone Note Refill Request Call back at Home Phone [phone] Message from:  Patient  Refills Requested: Medication #1:  TRAZODONE HCL 50 MG  TABS Take 1 tablet by mouth once a day at bedtime   Dosage confirmed as above?Dosage Confirmed   Supply Requested: 3 months   Last Refilled: 04/18/2010  Medication #2:  ZOCOR 40 MG  TABS Take 1 tablet by mouth once a day at bedtime   Dosage confirmed as above?Dosage Confirmed   Supply Requested: 3 months   Last Refilled: 08/30/2009   Notes: Lipids checked in 11/11 thru [facility] program pt has taken Buspar in the past to help with panic attacks.  She would like to try again instead of Meprobamate.  Buspar is cheaper.   Method Requested: Pick up at Office Next Appointment Scheduled: none Initial call taken by: Francee Piccolo CMA Duncan Dull),  July 07, 2010 12:11 PM  Follow-up for Phone Call        Will change to buspirone.  Will send in starting dose and then we can titirate up as needed.  Follow-up by: Nani Gasser MD,  July 07, 2010 12:38 PM  Additional Follow-up for Phone Call Additional follow up Details #1::        pt notified rx's are ready.  Re-print per pt request.  Rx's cancelled at local pharm. Additional Follow-up by: Francee Piccolo CMA Duncan Dull),  July 07, 2010 4:47 PM    New/Updated Medications: BUSPIRONE HCL 7.5 MG TABS (BUSPIRONE HCL) Take 1 tablet by mouth two times a day Prescriptions: BUSPIRONE HCL 7.5 MG TABS (BUSPIRONE HCL) Take 1 tablet by mouth two times a day  #30 x 1   Entered by:   Francee Piccolo CMA (AAMA)   Authorized by:   Nani Gasser MD   Signed by:   Francee Piccolo CMA (AAMA) on 07/07/2010   Method used:   Reprint   RxID:   [id number] TRAZODONE HCL 50 MG  TABS (TRAZODONE HCL) Take 1 tablet by mouth once a day at bedtime  #90 Each x 0   Entered by:   Francee Piccolo CMA (AAMA)   Authorized by:   Nani Gasser MD   Signed by:   Francee Piccolo  CMA (AAMA) on 07/07/2010   Method used:   Reprint   RxID:   [id number] ZOCOR 40 MG  TABS (SIMVASTATIN) Take 1 tablet by mouth once a day at bedtime  #90 x 3   Entered by:   Francee Piccolo CMA (AAMA)   Authorized by:   Nani Gasser MD   Signed by:   Francee Piccolo CMA (AAMA) on 07/07/2010   Method used:   Reprint   RxID:   [id number] BUSPIRONE HCL 7.5 MG TABS (BUSPIRONE HCL) Take 1 tablet by mouth two times a day  #30 x 1   Entered and Authorized by:   Nani Gasser MD   Signed by:   Nani Gasser MD on 07/07/2010   Method used:   Electronically to        [facility] [phone]* (retail)       [address] [place], [place]  [address]       Ph: [phone]       Fax: [phone]   RxID:   [id number] ZOCOR 40 MG  TABS (SIMVASTATIN) Take 1 tablet by mouth once a day at bedtime  #90 x 3   Entered and Authorized by:  Nani Gasser MD   Signed by:   Nani Gasser MD on 07/07/2010   Method used:   Electronically to        [facility] [phone]* (retail)       [address] [place], [place]  [address]       Ph: [phone]       Fax: [phone]   RxID:   [id number] TRAZODONE HCL 50 MG  TABS (TRAZODONE HCL) Take 1 tablet by mouth once a day at bedtime  #90 Each x 0   Entered and Authorized by:   Nani Gasser MD   Signed by:   Nani Gasser MD on 07/07/2010   Method used:   Electronically to        [facility] [phone]* (retail)       [address] [place], [place]  [address]       Ph: [phone]       Fax: [phone]   RxID:   [id number]

## 2010-08-13 ENCOUNTER — Telehealth: Payer: Self-pay | Admitting: Family Medicine

## 2010-08-13 MED ORDER — CYCLOBENZAPRINE HCL 10 MG PO TABS: 10.0000 mg | ORAL_TABLET | Freq: Every day | ORAL | Status: DC | PRN

## 2010-08-13 MED ORDER — ALPRAZOLAM 0.5 MG PO TABS: 0.5000 mg | ORAL_TABLET | Freq: Two times a day (BID) | ORAL | Status: AC | PRN

## 2010-08-13 NOTE — Telephone Encounter (Signed)
Pt called and states she wants to pick up a hard copy of her alprazolam and cyclobenzaprine to mail off to her mail order pharm because she has a new mail order and they require hard copy

## 2010-08-15 ENCOUNTER — Other Ambulatory Visit: Payer: Self-pay | Admitting: *Deleted

## 2010-08-15 MED ORDER — SERTRALINE HCL 100 MG PO TABS: ORAL_TABLET | ORAL | Status: DC

## 2010-09-16 NOTE — Assessment & Plan Note (Signed)
[name], Bailey                ACCOUNT NO.:  [id number]   MEDICAL RECORD NO.:  [id number]          PATIENT TYPE:  POB   LOCATION:  [facility] [place]         FACILITY:  [facility]   PHYSICIAN:  Scheryl Darter, MD       DATE OF BIRTH:  01/09/1935   DATE OF SERVICE:  08/07/2008                                  CLINIC NOTE   HISTORY OF PRESENT ILLNESS:  The patient comes to office today for  initial consultation on her migraine headaches.  This is a patient that  was referred by Dr. Nani Gasser from [place] at the [place]  office.  The patient has been experiencing headaches for approximately  the last 50 years.  She had her first migraine with aura when she was 8  months pregnant.  She describes her aura as a blind spot that enlarges.  She has a triangular shapes and  flashing lights.  Her headaches occur  in her right or left temporal, and she definitely has some neck spasm  that may contribute.  She is unaware whether they are in her occipital  region or not.  She is currently suffering approximately 3 severe  headaches per month.  These headaches can last as long as 3 days if she  does not take her medication.  She has for years and years been taking  meprobamate with or without Tylenol that does seem to lessen the  severity and the length of the migraine.  She does not have any moderate  headaches, and she has approximately 8 mild headaches per month that are  easily taking care of with Tylenol.  The patient is currently on several  medications.  She is on a statin for her elevated cholesterol.  She is  on enalapril and a beta-blocker for her blood pressure.  She is on 150  mg of Effexor for anxiety and depression.  She is on trazodone for  sleep.  She takes Xanax for anxiety and night terrors.  In the past, she  has trailed Ativan, BuSpar, Prozac, ergotamine, __________ Midrin,  Equagesic, Topamax, and Cafergot.  The patient has found that over the  years with the control  of her depression and anxiety that her headaches  have improved.  She does have some difficulty with sleep.  She has  difficulty in maintaining sleep.  She has never had a sleep study.  She  relates her triggers as being stress of an ageing husband who has a  questionable dementia, questionable mental illness in a lengthy bad  marriage.  She does counseling through a counselor in this building, her  name is Merlene Morse, and she has done a good job with her sessions.  The  patient is currently using exercise, reading, and gardening to control  her stress.   PHYSICAL EXAMINATION:  GENERAL:  Well-developed, well-nourished 73-year-  old Caucasian female, in no acute distress.  VITAL SIGNS:  Blood pressure is 101/61 and pulse is 69.  HEENT:  Head is normocephalic and atraumatic.  Pupils equal and  reactive.  NEUROLOGIC:  The patient is alert, oriented, answers questions  appropriately.  She has good muscle tone and coordination.  She  has good  sensations.  She is cooperative and polite.  CARDIAC:  Regular rate and rhythm.  No murmurs, rubs, or thrills.  LUNGS:  Clear bilaterally without rales, rhonchi, or wheezes.  EXTREMITIES:  Warm and dry.   ASSESSMENT:  1. Migraine with aura.  2. Elevated cholesterol.  3. Elevated blood pressure.  4. Depression.  5. Anxiety.  6. Insomnia.   PLAN:  After approximately 45 minutes discussion with Ms. Fitzpatrick, what we  have decided is to leave her medications stable.  She will continue on  the Effexor of 150 mg, trazodone 50 mg, and Xanax 0.5 mg p.r.n.  She  takes her Xanax approximately 3 times per week.  Her main issue is that  she needs a substitution for this meprobamate, which she took early in  her headache cycle and seem to help with the migraine with aura.  This  patient is not a candidate for Triptan, as she is elderly, she has  obesity, elevated blood pressure, and elevated cholesterol.  We have  decided that she will use her Xanax, and she has  been Flexeril in the  past.  She will add those as  combination prior to when she begins to  get an aura and hopefully this will help decrease the severity and  intensity of the headache.  The patient is encouraged to remain in the  counseling with Ms. Alvester Morin.  She will continue to see Dr. Linford Arnold for her  medical problems.  She will come to see me on an as needed basis.      Remonia Richter, NP    ______________________________  Scheryl Darter, MD    LR/MEDQ  D:  08/07/2008  T:  08/08/2008  Job:  [id number]   cc:   Nani Gasser, MD

## 2010-10-10 ENCOUNTER — Other Ambulatory Visit: Payer: Self-pay | Admitting: Family Medicine

## 2010-10-10 MED ORDER — SERTRALINE HCL 100 MG PO TABS: 200.0000 mg | ORAL_TABLET | Freq: Every day | ORAL | Status: DC

## 2010-10-10 MED ORDER — TRAZODONE HCL 50 MG PO TABS: 50.0000 mg | ORAL_TABLET | Freq: Every day | ORAL | Status: DC

## 2010-11-03 ENCOUNTER — Ambulatory Visit (INDEPENDENT_AMBULATORY_CARE_PROVIDER_SITE_OTHER): Payer: Medicare Other | Admitting: Family Medicine

## 2010-11-03 ENCOUNTER — Encounter: Payer: Self-pay | Admitting: Family Medicine

## 2010-11-03 ENCOUNTER — Telehealth: Payer: Self-pay | Admitting: Family Medicine

## 2010-11-03 ENCOUNTER — Ambulatory Visit
Admission: RE | Admit: 2010-11-03 | Discharge: 2010-11-03 | Disposition: A | Discharge status: Home / Self Care | Payer: Medicare Other | Source: Ambulatory Visit | Attending: Family Medicine | Admitting: Family Medicine

## 2010-11-03 VITALS — BP 131/78 | HR 71 | Ht 66.5 in | Wt 193.0 lb

## 2010-11-03 DIAGNOSIS — M25559 Pain in unspecified hip: Secondary | ICD-10-CM

## 2010-11-03 DIAGNOSIS — G43909 Migraine, unspecified, not intractable, without status migrainosus: Secondary | ICD-10-CM

## 2010-11-03 DIAGNOSIS — M25551 Pain in right hip: Secondary | ICD-10-CM

## 2010-11-03 MED ORDER — CARISOPRODOL 350 MG PO TABS: 350.0000 mg | ORAL_TABLET | Freq: Every day | ORAL | Status: DC | PRN

## 2010-11-03 MED ORDER — HYDROCODONE-ACETAMINOPHEN 5-325 MG PO TABS: 1.0000 | ORAL_TABLET | Freq: Three times a day (TID) | ORAL | Status: AC | PRN

## 2010-11-03 NOTE — Telephone Encounter (Signed)
Call pt: Hip xray shows some mild OA. Would like to set her up for PT and have ortho see her for possible injection for pain relief.  

## 2010-11-03 NOTE — Telephone Encounter (Signed)
Call pt: Hip xray shows some mild OA. Would like to set her up for PT and have ortho see her for possible injection for pain relief.

## 2010-11-03 NOTE — Assessment & Plan Note (Signed)
Discussed will changing o soma for PRN use but warned about it addictive and abuse potential. Pt understands.

## 2010-11-03 NOTE — Patient Instructions (Signed)
We will call you with the x-ray results. 

## 2010-11-03 NOTE — Progress Notes (Signed)
  Subjective:    Patient ID: Dawn Bailey, female    DOB: 1934/11/03, 76 y.o.   MRN: [id number]  HPI Hx of low back Pain. Has had PT and does some exercises when her back bothers her.  About 3 wk agostarted having hip pain.  After has been sitting for awhile and tries to straighten her back she has excruiting pain into her right hip and right groin. Radiates into her upper thigh. Feels like the muscle is cramping up.  Her back will hurt too.  Pain was so bad she cried yestereday. Tried her exercises but that made it worse. For a couple of days took IBU - miild relief.  Feels tight with flexion.  More painful going up and down stairs.  More painful when she is sitting and reaches to her right.    Also the flexeril she uses for her migraines prn doesn't work at all. She would like to try chaning to soma. Used a family members soma for her last migraine and says it worked really well.   Review of Systems     Objective:   Physical Exam  Musculoskeletal:       Right hip with NROM. Pain with internal rotation.  Hip strength 5/5. Patellar and achilles reflexes 2+ bilat. Normal flexion.  Tender over the greater trochanter and tender over the upper iliac crest.           Assessment & Plan:  Right Hip Pain- Suspect mod to severe OA based on her sxs. Discussed that I think her pain warrants and xray to determine how much OA she may have in her hip.  In the meantime, NSAIDs for pani relief. Avoid overuse. Can consider stronger pain med if needed.  If xray OK then will refer for PT and possible to ortho for injection for pain relief.

## 2010-11-04 NOTE — Telephone Encounter (Signed)
Pt states she already has an ortho MD at [facility] and she would call and make her own appt, also will pick up films to take. She said she prefers to do PT there as well and will have him set that up.

## 2010-11-26 ENCOUNTER — Other Ambulatory Visit: Payer: Self-pay | Admitting: Family Medicine

## 2010-11-28 ENCOUNTER — Other Ambulatory Visit: Payer: Self-pay | Admitting: Family Medicine

## 2010-12-03 ENCOUNTER — Other Ambulatory Visit: Payer: Self-pay | Admitting: Family Medicine

## 2010-12-27 ENCOUNTER — Other Ambulatory Visit: Payer: Self-pay | Admitting: Family Medicine

## 2011-01-13 ENCOUNTER — Encounter: Payer: Self-pay | Admitting: Family Medicine

## 2011-01-16 ENCOUNTER — Ambulatory Visit (INDEPENDENT_AMBULATORY_CARE_PROVIDER_SITE_OTHER): Payer: Medicare Other | Admitting: Family Medicine

## 2011-01-16 ENCOUNTER — Encounter: Payer: Self-pay | Admitting: Family Medicine

## 2011-01-16 ENCOUNTER — Telehealth: Payer: Self-pay | Admitting: Family Medicine

## 2011-01-16 VITALS — BP 130/78 | HR 88 | Wt 193.0 lb

## 2011-01-16 DIAGNOSIS — R259 Unspecified abnormal involuntary movements: Secondary | ICD-10-CM

## 2011-01-16 DIAGNOSIS — F419 Anxiety disorder, unspecified: Secondary | ICD-10-CM

## 2011-01-16 DIAGNOSIS — F411 Generalized anxiety disorder: Secondary | ICD-10-CM

## 2011-01-16 DIAGNOSIS — R251 Tremor, unspecified: Secondary | ICD-10-CM

## 2011-01-16 DIAGNOSIS — G250 Essential tremor: Secondary | ICD-10-CM | POA: Insufficient documentation

## 2011-01-16 DIAGNOSIS — Z23 Encounter for immunization: Secondary | ICD-10-CM

## 2011-01-16 MED ORDER — METOPROLOL TARTRATE 25 MG PO TABS: 25.0000 mg | ORAL_TABLET | Freq: Two times a day (BID) | ORAL | Status: DC

## 2011-01-16 MED ORDER — ENALAPRIL MALEATE 5 MG PO TABS: 5.0000 mg | ORAL_TABLET | Freq: Every day | ORAL | Status: DC

## 2011-01-16 MED ORDER — CARISOPRODOL 350 MG PO TABS: 350.0000 mg | ORAL_TABLET | Freq: Two times a day (BID) | ORAL | Status: DC | PRN

## 2011-01-16 NOTE — Telephone Encounter (Signed)
Pt called and said she was seen the morning and the [facility] in [place] is saying they don't have a refill for soma 350 mg that was suppose to have been sent. Plan:  Notified the pharm and gave a verbal because they still did not have the refill sent earlier today. Jarvis Newcomer, LPN [facility]

## 2011-01-16 NOTE — Progress Notes (Signed)
  Subjective:    Patient ID: Dawn Bailey, female    DOB: 11-26-1934, 76 y.o.   MRN: [id number]  HPI  Anxiety-poorly controlled currently. She denies any side effects with her medications. Has been really stressed lately. She is dealing with Left jaw pain. Has been to dentist twice adn they think it is TMJ.  She is being fitted for a mouthguard to wear at night. Her pain is better. Also her left hip is better as well.  Her husband is getting forgetful. He has some mild memory loss and she is now having to do more caretaking for him. This has been stressful as he also has hearing loss. She is still getting the occasional fecal incontinence but overall it has been better. Also she ran out of her soma for her migraines which she uses in place of the meprobamate. Next  It is her noticed her hands have been shaking in the morning and sometimes at dinnertime. She is scheduled for blood work in about 2-3 weeks with the diabetic prevention program that she is involved in at [facility]. They can certainly check a blood sugar test while she is there.  Her colonoscopy is up-to-date. She would like to get a flu shot today.  Review of Systems     Objective:   Physical Exam  Constitutional: She is oriented to person, place, and time. She appears well-developed and well-nourished.  HENT:  Head: Normocephalic and atraumatic.  Cardiovascular: Normal rate, regular rhythm and normal heart sounds.   Pulmonary/Chest: Effort normal and breath sounds normal.  Neurological: She is alert and oriented to person, place, and time.       She does not have a tremor in her hands her wrists and her lap but there is a mild tremor in both hands when she extends them out and with intention.  Skin: Skin is warm and dry.  Psychiatric: She has a normal mood and affect.          Assessment & Plan:  Anxiety-her 7 score is 20 a day which is clearly not well controlled. She really is already on the highest is a sertraline. I  think she does have some acute stressors that made things worse lately. I recommend that we continue current regimen and see for the next couple weeks she feels her mood is improving and some of these more acute situations will resolve. If not then she skipped the office a call and we can adjust her medication. Otherwise I will follow her back in 4 months. She has seen a counselor in the past and is not interested in going back right now she feels like the things that are bothering her R. things are not in her control.  Tremor appears to be but mild and benign. Certainly this could be also because her anxiety is worse. We will see how this progresses over the next several months.

## 2011-02-21 ENCOUNTER — Other Ambulatory Visit: Payer: Self-pay | Admitting: Family Medicine

## 2011-03-16 LAB — LIPID PANEL
Cholesterol: 195 mg/dL (ref 0–200)
HDL: 71 mg/dL — AB (ref 35–70)
LDL Cholesterol: 99 mg/dL
Triglycerides: 123 mg/dL (ref 40–160)

## 2011-03-16 LAB — BASIC METABOLIC PANEL: Glucose: 83 mg/dL

## 2011-03-18 ENCOUNTER — Encounter: Payer: Self-pay | Admitting: Family Medicine

## 2011-03-18 ENCOUNTER — Ambulatory Visit (INDEPENDENT_AMBULATORY_CARE_PROVIDER_SITE_OTHER): Payer: Medicare Other | Admitting: Family Medicine

## 2011-03-18 VITALS — BP 129/79 | HR 77 | Ht 66.0 in | Wt 196.0 lb

## 2011-03-18 DIAGNOSIS — F419 Anxiety disorder, unspecified: Secondary | ICD-10-CM

## 2011-03-18 DIAGNOSIS — F411 Generalized anxiety disorder: Secondary | ICD-10-CM

## 2011-03-18 DIAGNOSIS — F439 Reaction to severe stress, unspecified: Secondary | ICD-10-CM

## 2011-03-18 DIAGNOSIS — K589 Irritable bowel syndrome without diarrhea: Secondary | ICD-10-CM

## 2011-03-18 DIAGNOSIS — Z733 Stress, not elsewhere classified: Secondary | ICD-10-CM

## 2011-03-18 MED ORDER — CYCLOBENZAPRINE HCL 10 MG PO TABS: 10.0000 mg | ORAL_TABLET | Freq: Every evening | ORAL | Status: DC | PRN

## 2011-03-18 MED ORDER — BUSPIRONE HCL 7.5 MG PO TABS: 7.5000 mg | ORAL_TABLET | Freq: Three times a day (TID) | ORAL | Status: DC

## 2011-03-18 MED ORDER — TRAZODONE HCL 50 MG PO TABS: 50.0000 mg | ORAL_TABLET | Freq: Every day | ORAL | Status: DC

## 2011-03-18 MED ORDER — SERTRALINE HCL 100 MG PO TABS: 200.0000 mg | ORAL_TABLET | Freq: Every day | ORAL | Status: DC

## 2011-03-18 NOTE — Progress Notes (Signed)
  Subjective:    Patient ID: Dawn Bailey, female    DOB: 06-Oct-1934, 76 y.o.   MRN: [id number]  HPI Having frequent IBS flares. She does use the levid and helps with the pain but then makes her feel dizzy. She is really struggling with dealing her husband who is really controlling. She went to counseling a couple years ago downstairs. She currently has several family members that also work with Dawn Bailey. She said she has really just made her mind up that she needs to learn how to cope and deal with her husband. She knows the situation will not change. She is ready to make some changes herself to help her better care with that. Overall though she does feel that her anxiety and stress level is improved from when she was Lev[facility] here. There she said she did have a depressive episode a couple weeks ago.   Review of Systems     Objective:   Physical Exam  Constitutional: She is oriented to person, place, and time. She appears well-developed and well-nourished.  HENT:  Head: Normocephalic and atraumatic.  Cardiovascular: Normal rate, regular rhythm and normal heart sounds.   Pulmonary/Chest: Effort normal and breath sounds normal.  Neurological: She is alert and oriented to person, place, and time.  Skin: Skin is warm and dry.  Psychiatric: She has a normal mood and affect. Her behavior is normal.          Assessment & Plan:  IBS-at this point in time I think definitely working on her stress levels will help better control her IBS. As far as the Levbid is concerned I will leave that up to her. I explained that the matter of benefit versus potential side effects. If she really doesn't like how it makes her feel then she can discontinue it. She says she might try using it at bedtime and see if that makes a difference as far as causing some of the dizziness and lightheadedness she's been experiencing with the medication.   Anxiety, acute situational stress-I think  a bit and tachycardia for her workup of the counselors down [place]. She would like to try working with Dawn Bailey. It might be helpful since she has some of the family members. I think she is really motivated to try to make some changes to better cope with her current situation. She says even just making the decisions that she was to work on this has actually improved her stress level. Her PHQ 9 score is 12 today, this is down from 20 at her last visit.

## 2011-04-15 ENCOUNTER — Ambulatory Visit (HOSPITAL_COMMUNITY): Payer: Medicare Other | Admitting: Licensed Clinical Social Worker

## 2011-05-01 ENCOUNTER — Other Ambulatory Visit: Payer: Self-pay | Admitting: Family Medicine

## 2011-05-06 ENCOUNTER — Ambulatory Visit (INDEPENDENT_AMBULATORY_CARE_PROVIDER_SITE_OTHER): Payer: Medicare Other | Admitting: Licensed Clinical Social Worker

## 2011-05-06 ENCOUNTER — Encounter (HOSPITAL_COMMUNITY): Payer: Self-pay | Admitting: Licensed Clinical Social Worker

## 2011-05-06 DIAGNOSIS — F419 Anxiety disorder, unspecified: Secondary | ICD-10-CM

## 2011-05-06 DIAGNOSIS — F32A Depression, unspecified: Secondary | ICD-10-CM

## 2011-05-06 DIAGNOSIS — F331 Major depressive disorder, recurrent, moderate: Secondary | ICD-10-CM | POA: Insufficient documentation

## 2011-05-06 DIAGNOSIS — F341 Dysthymic disorder: Secondary | ICD-10-CM

## 2011-05-06 NOTE — Patient Instructions (Signed)
Work on how you think about things in relation to your husband Deal with limits in helping him with all requests Ask him to do things for you

## 2011-05-06 NOTE — Progress Notes (Signed)
   THERAPIST PROGRESS NOTE  Session Time: 11:10 - 12:10  Participation Level: Active  Behavioral Response: Fairly GroomedAlertEuthymic  Type of Therapy: Individual Therapy  Treatment Goals addressed: Anxiety and Coping  Interventions: Solution Focused, Strength-based, Assertiveness Training, Supportive, Family Systems and Reframing  Summary: CHIQUETTA LANGNER is a 77 y.o. female who presents with a pleasant mood.  It was nice to see Natesha again - last time seen was in 2009.  She has done better in dealing with difficult husband and he is doing better in that he is helping around the house.  She has been dealing with IB'S which she feels is because of the stress with her husband.  He has become more hard of hearing and has a hearing aide.  He is giving everyone in the family a list of considerations that need to be made for him.  He is not tolerant if everyone is not on board and does not include him in the way that he needs.  They are doing the best they can but he needs to understand that it will not always be perfect.  Suggested sitting differently when there are more people there so that she can be speaking to someone else and he can understand.  They need to sit so that there is not a need to turn your head all the way.  Discussed problems with food and ways he has to control what she does and does not do.  She will not make two different meals and he knows that.  The problem that is causing her the most stress is the way her head thinks about her situation.  She is always figuring out what he will do next.  She will work on that for she can see how that stresses her out.  She also has a lot of trouble with him yelling which feels normal to him but it is stressful to her.  Discussed how that can feel assaultive and when I said that she was clearer about what she needed to do to take care of herself.  She does go to an exercise class twice a week and enjoys it a lot - she will go and do errands after  or visit her daughter.  She escapes by reading a lot.  She will work on these new ways of dealing with her husband and come back in a few weeks to discuss how it went.  She is mainly concerned about his control issues along with his dependency...  .   Suicidal/Homicidal: Nowithout intent/plan  Plan: Return again in 6 weeks.  Diagnosis: Axis I: Dysthymic Disorder    Axis II: Deferred    Detrice Cales,JUDITH A, LCSW 05/06/2011

## 2011-05-08 ENCOUNTER — Other Ambulatory Visit: Payer: Self-pay | Admitting: *Deleted

## 2011-05-08 MED ORDER — ALPRAZOLAM 0.5 MG PO TABS: 0.5000 mg | ORAL_TABLET | Freq: Two times a day (BID) | ORAL | Status: DC | PRN

## 2011-05-08 NOTE — Telephone Encounter (Signed)
Prescription faxed.  MA, LPN

## 2011-05-08 NOTE — Telephone Encounter (Signed)
Printed rx  

## 2011-05-08 NOTE — Telephone Encounter (Signed)
Pt request to have Xanax refilled. Please advise.  MA, LPN

## 2011-06-15 ENCOUNTER — Telehealth: Payer: Self-pay | Admitting: Family Medicine

## 2011-06-15 ENCOUNTER — Ambulatory Visit (INDEPENDENT_AMBULATORY_CARE_PROVIDER_SITE_OTHER): Payer: Medicare Other | Admitting: Family Medicine

## 2011-06-15 ENCOUNTER — Encounter: Payer: Self-pay | Admitting: Family Medicine

## 2011-06-15 VITALS — BP 118/75 | HR 86 | Wt 200.0 lb

## 2011-06-15 DIAGNOSIS — J309 Allergic rhinitis, unspecified: Secondary | ICD-10-CM

## 2011-06-15 DIAGNOSIS — E663 Overweight: Secondary | ICD-10-CM

## 2011-06-15 DIAGNOSIS — Z6829 Body mass index (BMI) 29.0-29.9, adult: Secondary | ICD-10-CM | POA: Insufficient documentation

## 2011-06-15 DIAGNOSIS — I10 Essential (primary) hypertension: Secondary | ICD-10-CM

## 2011-06-15 DIAGNOSIS — N951 Menopausal and female climacteric states: Secondary | ICD-10-CM

## 2011-06-15 DIAGNOSIS — R0789 Other chest pain: Secondary | ICD-10-CM

## 2011-06-15 DIAGNOSIS — R51 Headache: Secondary | ICD-10-CM

## 2011-06-15 MED ORDER — CARISOPRODOL 350 MG PO TABS: 350.0000 mg | ORAL_TABLET | Freq: Every day | ORAL | Status: DC | PRN

## 2011-06-15 NOTE — Patient Instructions (Signed)
We will call you with your lab results. If you don't here from [place] in about a week then please give [place] a call at [phone]. Try the dexilant samples. Once a day for a week and let me know if helping. If not then let me know.   Diet for GERD or PUD Nutrition therapy can help ease the discomfort of gastroesophageal reflux disease (GERD) and peptic ulcer disease (PUD).   HOME CARE INSTRUCTIONS    Eat your meals slowly, in a relaxed setting.     Eat 5 to 6 small meals per day.     If a food causes distress, stop eating it for a period of time.  FOODS TO AVOID  Coffee, regular or decaffeinated.     Cola beverages, regular or low calorie.     Tea, regular or decaffeinated.     Pepper.    Cocoa.    High fat foods, including meats.     Butter, margarine, hydrogenated oil (trans fats).     Peppermint or spearmint (if you have GERD).     Fruits and vegetables if not tolerated.     Alcohol.    Nicotine (smoking or chewing). This is one of the most potent stimulants to acid production in the gastrointestinal tract.     Any food that seems to aggravate your condition.  If you have questions regarding your diet, ask your caregiver or a registered dietitian. TIPS  Lying flat may make symptoms worse. Keep the head of your bed raised 6 to 9 inches (15 to 23 cm) by using a foam wedge or blocks under the legs of the bed.     Do not lay down until 3 hours after eating a meal.     Daily physical activity may help reduce symptoms.  MAKE SURE YOU:    Understand these instructions.     Will watch your condition.     Will get help right away if you are not doing well or get worse.  Document Released: 04/20/2005 Document Revised: 12/31/2010 Document Reviewed: 09/03/2008 [facility] Patient Information 2012 [place], [place].

## 2011-06-15 NOTE — Telephone Encounter (Signed)
Last labs i have on her from [facility] are from May and didn't include cholesterol. Can she hav [facility] fax the latest labs to [place] or if she has the phone number for the study she is in then we can call. Also when was her last bone density test.

## 2011-06-15 NOTE — Progress Notes (Signed)
  Subjective:    Patient ID: Dawn Bailey, female    DOB: December 11, 1934, 77 y.o.   MRN: [id number]  HPI Muscle relaxer for HA - Says flexerils price is doubling. Says flexeril has also caused diarrhea when she takes it.   After sitting for awihle and then stands up will stuble to the left. Started maybe a months ago.  She thinks it may be a balance problems.    Chest pain near the mid lower sternum. Can hurt when laying in bed.  Worse when reaching out as well. No sweating or nausea. No history of stress test.  Can last seconds and then resolves. No SOB.  Wonders if could be GERd. Has had more gas or belching.  Hasn't tried any meds for this. No nausea or abdominal Pain.     Review of Systems     Objective:   Physical Exam  Constitutional: She is oriented to person, place, and time. She appears well-developed and well-nourished.  HENT:  Head: Normocephalic and atraumatic.  Neck: Neck supple. No thyromegaly present.  Cardiovascular: Normal rate, regular rhythm and normal heart sounds.        No carotid or abdominal bruits.   Pulmonary/Chest: Effort normal and breath sounds normal.  Abdominal: Soft. Bowel sounds are normal. She exhibits no distension and no mass. There is no tenderness. There is no rebound and no guarding.  Musculoskeletal: She exhibits no edema.  Lymphadenopathy:    She has no cervical adenopathy.  Neurological: She is alert and oriented to person, place, and time.  Skin: Skin is warm and dry.  Psychiatric: She has a normal mood and affect. Her behavior is normal.          Assessment & Plan:  HA/Neck spasm - will change back to soma. Did well on this in the past. Will resent to pharm.   Atypical chest Pain - Likley GERD. EKG shows rate of 64 bpm, NSR, no acute changes.  Will check CMP. Lipoids, TSH.  Will call with results.  10 days trial of Dexilant samples to see if controls symptoms. Check labs. Call me in week nad let me know if the PPI is helping.   Note,  patient says that she had a lot of blood work done at [facility] for a study that she is involved in. I did search her records and unfortunately we did not have a copy of this. I will send her for note to please call the office and have him back for the records. I want to make sure that the cholesterol is also included and the thyroid and the blood work that they have done.

## 2011-06-16 NOTE — Telephone Encounter (Signed)
Ok, due for next one. Ordered. Dawn Bailey should call her this week.

## 2011-06-16 NOTE — Telephone Encounter (Signed)
Pt called back and states that her last bone density test was May 26,2010

## 2011-06-16 NOTE — Telephone Encounter (Signed)
Pt states her last bone density test was 4-5 years ago. States she will get [facility] to fax all lab results.

## 2011-06-16 NOTE — Telephone Encounter (Signed)
LM for pt

## 2011-06-17 ENCOUNTER — Ambulatory Visit (HOSPITAL_COMMUNITY): Payer: Self-pay | Admitting: Licensed Clinical Social Worker

## 2011-06-22 ENCOUNTER — Other Ambulatory Visit: Payer: Self-pay | Admitting: *Deleted

## 2011-06-22 MED ORDER — SIMVASTATIN 40 MG PO TABS: 40.0000 mg | ORAL_TABLET | Freq: Every day | ORAL | Status: DC

## 2011-06-24 ENCOUNTER — Encounter: Payer: Self-pay | Admitting: *Deleted

## 2011-07-01 ENCOUNTER — Encounter: Payer: Self-pay | Admitting: *Deleted

## 2011-09-25 ENCOUNTER — Other Ambulatory Visit: Payer: Self-pay | Admitting: *Deleted

## 2011-09-25 MED ORDER — CYCLOBENZAPRINE HCL 10 MG PO TABS: 10.0000 mg | ORAL_TABLET | Freq: Every day | ORAL | Status: DC | PRN

## 2011-09-25 MED ORDER — SERTRALINE HCL 100 MG PO TABS: 200.0000 mg | ORAL_TABLET | Freq: Every day | ORAL | Status: DC

## 2011-09-25 MED ORDER — TRAZODONE HCL 50 MG PO TABS: 50.0000 mg | ORAL_TABLET | Freq: Every day | ORAL | Status: DC

## 2011-09-25 MED ORDER — SIMVASTATIN 40 MG PO TABS: 40.0000 mg | ORAL_TABLET | Freq: Every day | ORAL | Status: DC

## 2011-09-25 NOTE — Telephone Encounter (Signed)
Sent 30 tabs to [facility], she cannot take or mix with soma.

## 2011-09-25 NOTE — Telephone Encounter (Signed)
Pt is requesting refill on Flexeril and it has not been filled since 08/2009. Please advise.

## 2011-09-29 NOTE — Telephone Encounter (Signed)
Pt informed

## 2011-09-30 ENCOUNTER — Encounter: Payer: Self-pay | Admitting: Family Medicine

## 2011-09-30 ENCOUNTER — Ambulatory Visit (INDEPENDENT_AMBULATORY_CARE_PROVIDER_SITE_OTHER): Payer: Medicare Other | Admitting: Family Medicine

## 2011-09-30 VITALS — BP 115/67 | HR 76 | Ht 66.5 in | Wt 204.0 lb

## 2011-09-30 DIAGNOSIS — G43909 Migraine, unspecified, not intractable, without status migrainosus: Secondary | ICD-10-CM

## 2011-09-30 MED ORDER — METAXALONE 800 MG PO TABS: 800.0000 mg | ORAL_TABLET | Freq: Two times a day (BID) | ORAL | Status: DC | PRN

## 2011-09-30 NOTE — Progress Notes (Addendum)
  Subjective:    Patient ID: Dawn Bailey, female    DOB: 07/04/1934, 77 y.o.   MRN: [id number]  HPI Migarines f/u - Says the flexeril irritated her IBS.  The flexeril was causing some more loose stool. Says doesn't happen with the soma but the soma make her feel "weird".  Can't  use the meprobromate any mor bc of age risk. Daughter moved back in with her and this has been stressful.  Overall migraines have been fairly well controlled thought still weekly.     Review of Systems     Objective:   Physical Exam  Constitutional: She is oriented to person, place, and time. She appears well-developed and well-nourished.  HENT:  Head: Normocephalic and atraumatic.  Cardiovascular: Normal rate, regular rhythm and normal heart sounds.   Pulmonary/Chest: Effort normal and breath sounds normal.  Neurological: She is alert and oriented to person, place, and time.  Skin: Skin is warm and dry.  Psychiatric: She has a normal mood and affect. Her behavior is normal.          Assessment & Plan:  Migraine - discussed lesions. We can try changing to Skelaxin since the soma makes her feel weird and Flexeril seems to aggravate her irritable bowel syndrome. We can try a different muscle relaxer, if this one does not work well. I would prefer that she stay away from the meprobamate if possible just because of increased side effects in geriatric patients. Even though it works very well for her.

## 2011-10-01 ENCOUNTER — Telehealth: Payer: Self-pay | Admitting: *Deleted

## 2011-10-01 MED ORDER — METAXALONE 800 MG PO TABS: 800.0000 mg | ORAL_TABLET | Freq: Two times a day (BID) | ORAL | Status: AC | PRN

## 2011-10-01 NOTE — Telephone Encounter (Signed)
Pt states she wants skelaxin sent to [facility] because it was too expensive at [facility] . She doesn't use her insurance card at [facility] because of the meds she get from them are cheap for out of pocket expense. Skelaxin was $200. Will [facility] [address]

## 2011-10-27 DIAGNOSIS — M545 Low back pain, unspecified: Secondary | ICD-10-CM | POA: Insufficient documentation

## 2011-10-29 ENCOUNTER — Other Ambulatory Visit: Payer: Self-pay | Admitting: Family Medicine

## 2011-11-19 ENCOUNTER — Encounter: Payer: Self-pay | Admitting: Family Medicine

## 2011-11-19 ENCOUNTER — Ambulatory Visit (INDEPENDENT_AMBULATORY_CARE_PROVIDER_SITE_OTHER): Payer: Medicare Other | Admitting: Family Medicine

## 2011-11-19 VITALS — BP 119/71 | HR 71 | Ht 66.5 in | Wt 203.0 lb

## 2011-11-19 DIAGNOSIS — I10 Essential (primary) hypertension: Secondary | ICD-10-CM

## 2011-11-19 DIAGNOSIS — G43909 Migraine, unspecified, not intractable, without status migrainosus: Secondary | ICD-10-CM

## 2011-11-19 DIAGNOSIS — K589 Irritable bowel syndrome without diarrhea: Secondary | ICD-10-CM

## 2011-11-19 DIAGNOSIS — E785 Hyperlipidemia, unspecified: Secondary | ICD-10-CM

## 2011-11-19 MED ORDER — DICYCLOMINE HCL 20 MG PO TABS: 20.0000 mg | ORAL_TABLET | Freq: Four times a day (QID) | ORAL | Status: DC

## 2011-11-19 NOTE — Progress Notes (Signed)
  Subjective:    Patient ID: Dawn Bailey, female    DOB: 1934-09-05, 77 y.o.   MRN: [id number]  HPI IBS - Says the levid worked but then causes a severe HA and then doesn't feel well on it.  Says her daugher uses Bentyl and that works well for her.  Migraines - Says the skelexain didn't really seem to help. Says the soma made her feel weird but it did really help.    Hyperlipidemia-she says she's been hearing on the news that simvastatin can increase risk of muscle aches and pains. She says she does have some but feels that they are mild and wonders if they are normal for 77 year old or if it could be coming from her simvastatin. She is interested in possibly trying a different statin at least for a short period of time just to see if she feels any better or not.   Review of Systems     Objective:   Physical Exam  Constitutional: She is oriented to person, place, and time. She appears well-developed and well-nourished.  HENT:  Head: Normocephalic and atraumatic.  Cardiovascular: Normal rate, regular rhythm and normal heart sounds.   Pulmonary/Chest: Effort normal and breath sounds normal.  Neurological: She is alert and oriented to person, place, and time.  Skin: Skin is warm and dry.  Psychiatric: She has a normal mood and affect. Her behavior is normal.          Assessment & Plan:  IBS - We can try the bentyl since hasn't tried that, since she did not do well on the Levid.  Followup in 2 months. She can still use Imodium and Pepto-Bismol as needed for excessive diarrhea especially if she needs to leave the house.  Migraines - she says for now since she still has a fairly full prescription of Skelaxin she will stick with it until she runs out but she may consider going back to the soma. Even though she felt a little weird on it she says it did work to help with her migraines.  Hyperlipidemia - we discussed different options. She is placed on a moderate dose of simvastatin versus  higher doses which do increase risk of myalgias. I am okay with trying a different statin for a short period of time to see if it makes a difference. Given livalo 2mg  at bedtime for cholesterol in place of the simvastatin for one month. At the end of the month she can call me and let me know if she feels better on it. Then we can try to push it through her insurance if needed.

## 2011-11-19 NOTE — Patient Instructions (Signed)
livalo 2mg  at bedtime for cholesterol in place of the simvastatin.

## 2011-12-14 ENCOUNTER — Other Ambulatory Visit: Payer: Self-pay | Admitting: *Deleted

## 2011-12-14 MED ORDER — METOPROLOL TARTRATE 25 MG PO TABS: 25.0000 mg | ORAL_TABLET | Freq: Two times a day (BID) | ORAL | Status: DC

## 2011-12-14 MED ORDER — ENALAPRIL MALEATE 5 MG PO TABS: 5.0000 mg | ORAL_TABLET | Freq: Every day | ORAL | Status: DC

## 2011-12-17 ENCOUNTER — Ambulatory Visit (INDEPENDENT_AMBULATORY_CARE_PROVIDER_SITE_OTHER): Payer: Medicare Other | Admitting: Family Medicine

## 2011-12-17 ENCOUNTER — Encounter: Payer: Self-pay | Admitting: Family Medicine

## 2011-12-17 VITALS — BP 154/80 | HR 71 | Temp 98.2°F | Ht 66.5 in | Wt 203.0 lb

## 2011-12-17 DIAGNOSIS — L821 Other seborrheic keratosis: Secondary | ICD-10-CM

## 2011-12-17 DIAGNOSIS — H9202 Otalgia, left ear: Secondary | ICD-10-CM

## 2011-12-17 DIAGNOSIS — W57XXXA Bitten or stung by nonvenomous insect and other nonvenomous arthropods, initial encounter: Secondary | ICD-10-CM

## 2011-12-17 DIAGNOSIS — H16139 Photokeratitis, unspecified eye: Secondary | ICD-10-CM

## 2011-12-17 DIAGNOSIS — H9209 Otalgia, unspecified ear: Secondary | ICD-10-CM

## 2011-12-17 DIAGNOSIS — E785 Hyperlipidemia, unspecified: Secondary | ICD-10-CM

## 2011-12-17 MED ORDER — SERTRALINE HCL 100 MG PO TABS: 200.0000 mg | ORAL_TABLET | Freq: Every day | ORAL | Status: DC

## 2011-12-17 MED ORDER — TRAZODONE HCL 50 MG PO TABS: 50.0000 mg | ORAL_TABLET | Freq: Every day | ORAL | Status: DC

## 2011-12-17 MED ORDER — PITAVASTATIN CALCIUM 2 MG PO TABS: 2.0000 mg | ORAL_TABLET | Freq: Every day | ORAL | Status: DC

## 2011-12-17 NOTE — Progress Notes (Signed)
Subjective:    Patient ID: Dawn Bailey, female    DOB: 06-03-1934, 77 y.o.   MRN: [id number]  HPI Left ear pain that is sharp and intermittantly. Will last fore a few min at  Time.  Has been coming and going.  Saw Dentist, was told had a sinus infection. Was tx about  4 months ago.  Was told no abscess. Was tender along the left side of nose.  Then ear started hurting. No fever. No meds for pain.  Occ will get a HA with it.  No drainage from the ear. No other cold sxs.  No jaw pain.    About 2 weeks ago was bitten by a mosquito. Then got another bit in the same place and started to get worse.   It blistered and then opened. Used liquid benadryl on it.  Then started to heal.  Then a few days ago was bitten again on the left inner foot. Daughter with scabies who lives in the home.     Review of Systems     Objective:   Physical Exam  Constitutional: She is oriented to person, place, and time. She appears well-developed and well-nourished.  HENT:  Head: Normocephalic and atraumatic.  Right Ear: External ear normal.  Left Ear: External ear normal.  Nose: Nose normal.  Mouth/Throat: Oropharynx is clear and moist.       TMs and canals are clear. Left Tm is blocked by cerume.   Eyes: Conjunctivae and EOM are normal. Pupils are equal, round, and reactive to light.  Neck: Neck supple. No thyromegaly present.  Cardiovascular: Normal rate, regular rhythm and normal heart sounds.   Pulmonary/Chest: Effort normal and breath sounds normal. She has no wheezes.  Lymphadenopathy:    She has no cervical adenopathy.  Neurological: She is alert and oriented to person, place, and time.  Skin: Skin is warm and dry.       Small area of dry peeling skin on the top of the right foot.  Well healed. Area of purple/pink color on the arch of the right foot that is not swollen or tender. Appears to be healing well. There is a seborrheic keratosis near this area and on her right loer leg. ON her face there is an  AK on her nose and right eyebrow. Erythematous areas approx 0.5 cm with some scale.   Psychiatric: She has a normal mood and affect.          Assessment & Plan:  Left ear pain - Irrigation of the left canal.  OP is normal.  Examine the eardrum was normal after cerumen impaction was removed. At this point I gave reassurance. She is Re: taking her allergy medicines. Thought not on a nasal steroid. Tied the ample I gave her , thinks was nasonex and says ti caused HA.  Given sample fo Qnasl to start.  Also discussed that could be part of her teeth grinding. She has been given mouth guards in the past but does not wear them because she feels like she is choking when she does. Call if her pain persists over the next couple weeks.   Insect bite - Healing well. No further tx needed  SEb keratoses on right leg and left foot- Gave reassurance that these are benign lesion. Can be removed with cryotherapy if would like.    AK on face - Discussed tx options with cryotherapy. Pt will return for treatment.   Hyperlipidemia-she says she does feel better on the  Livalo samples compared to  the simvastatin. She felt the simvastatin was making her muscle still more tired and worn out more easily. We'll send a new prescription for Livalo.

## 2011-12-17 NOTE — Patient Instructions (Addendum)
Schedule an appointment to have the 2 areas on the face.  Call me if the ear is still painful.   Can start the Qnasl.  1 spray in each nostril daily - close opposite nostril when administering.

## 2011-12-21 ENCOUNTER — Ambulatory Visit (INDEPENDENT_AMBULATORY_CARE_PROVIDER_SITE_OTHER): Payer: Medicare Other | Admitting: Family Medicine

## 2011-12-21 ENCOUNTER — Encounter: Payer: Self-pay | Admitting: Family Medicine

## 2011-12-21 VITALS — BP 117/73 | HR 85 | Ht 66.5 in | Wt 204.0 lb

## 2011-12-21 DIAGNOSIS — L821 Other seborrheic keratosis: Secondary | ICD-10-CM

## 2011-12-21 DIAGNOSIS — G4700 Insomnia, unspecified: Secondary | ICD-10-CM

## 2011-12-21 DIAGNOSIS — B079 Viral wart, unspecified: Secondary | ICD-10-CM

## 2011-12-21 DIAGNOSIS — L570 Actinic keratosis: Secondary | ICD-10-CM

## 2011-12-21 DIAGNOSIS — E785 Hyperlipidemia, unspecified: Secondary | ICD-10-CM

## 2011-12-21 MED ORDER — ATORVASTATIN CALCIUM 40 MG PO TABS: 40.0000 mg | ORAL_TABLET | Freq: Every day | ORAL | Status: DC

## 2011-12-21 NOTE — Progress Notes (Signed)
  Subjective:    Patient ID: Dawn Bailey, female    DOB: 27-Aug-1934, 77 y.o.   MRN: [id number]  HPI Here for cryotherapy of 4 lesions. She has one on her nose and one on her left forehead over eyebrow consistent with an actinic keratosis. She has one on her left posterior leg consistent with a wart and one on her right anterior shin is consistent with a thickened seborrheic keratosis.  Hyperlipidemia we sent a prescription for Livalo to her mail order. A 90 day supply was going to cost $500 and she canceled the order. She is restart her old simvastatin even that doesn't cause muscle aches for her. She wants and if they're any other alternatives.  Insomnia-she also wants to know she can take an extra half a tab of her trazodone at night or she wakes up at exercycle hours to fall back asleep. She's currently taking 50 mg tabs of this would be an additional 25 mg.  Review of Systems     Objective:   Physical Exam  Constitutional: She appears well-developed and well-nourished.          Assessment & Plan:  Hyperlipidemia-we'll try Lipitor. She says her brother does well with this. We'll send a 30 day supply to her local pharmacy so we can gauge the cost. If doing well then we can send a 90 day to her mail order.  Insomnia-it is fine for her to take an extra half a tab on nights when needed. She just received a new prescription in the mail. When due for refill we will need to rewrite it to accommodate for the increased dose.  Actinic keratosis x2. Cryotherapy performed.  Wart - Cryotherpapy performed.   Seb keratosis - cryotherapy performed.  Cryotherapy Procedure Note  Pre-operative Diagnosis: Actinic keratosis x 2, seb keratosis, wart  Post-operative Diagnosis: same  Locations: 2 ak, one on nose and one on left eyebrow ridge,  wart on post left knee, and seb k on right ant skin  Indications: irritated.    Anesthesia: not required   Procedure Details  Patient informed of  risks (permanent scarring, infection, light or dark discoloration, bleeding, infection, weakness, numbness and recurrence of the lesion) and benefits of the procedure and verbal informed consent obtained.  The areas are treated with liquid nitrogen therapy, frozen until ice ball extended 3 mm beyond lesion, allowed to thaw, and treated again. The patient tolerated procedure well.  The patient was instructed on post-op care, warned that there may be blister formation, redness and pain. Recommend OTC analgesia as needed for pain.  Condition: Stable  Complications: none.  Plan: 1. Instructed to keep the area dry and covered for 24-48h and clean thereafter. 2. Warning signs of infection were reviewed.   3. Recommended that the patient use OTC acetaminophen as needed for pain.  4. Return prn.

## 2012-01-13 ENCOUNTER — Encounter: Payer: Self-pay | Admitting: Family Medicine

## 2012-01-13 ENCOUNTER — Ambulatory Visit (INDEPENDENT_AMBULATORY_CARE_PROVIDER_SITE_OTHER): Payer: Medicare Other | Admitting: Family Medicine

## 2012-01-13 VITALS — BP 104/71 | HR 70 | Wt 203.0 lb

## 2012-01-13 DIAGNOSIS — E785 Hyperlipidemia, unspecified: Secondary | ICD-10-CM

## 2012-01-13 DIAGNOSIS — N390 Urinary tract infection, site not specified: Secondary | ICD-10-CM

## 2012-01-13 LAB — POCT URINALYSIS DIPSTICK
Bilirubin, UA: NEGATIVE
Glucose, UA: NEGATIVE
Ketones, UA: NEGATIVE
Nitrite, UA: POSITIVE
Spec Grav, UA: 1.025
Urobilinogen, UA: 0.2
pH, UA: 6

## 2012-01-13 MED ORDER — NITROFURANTOIN MONOHYD MACRO 100 MG PO CAPS: 100.0000 mg | ORAL_CAPSULE | Freq: Two times a day (BID) | ORAL | Status: DC

## 2012-01-13 MED ORDER — ATORVASTATIN CALCIUM 40 MG PO TABS: 40.0000 mg | ORAL_TABLET | Freq: Every day | ORAL | Status: DC

## 2012-01-13 NOTE — Patient Instructions (Signed)

## 2012-01-13 NOTE — Progress Notes (Signed)
  Subjective:    Patient ID: Dawn Bailey, female    DOB: 16-Apr-1935, 77 y.o.   MRN: [id number]  HPI 5-6 days of dysuria and urinary frequency. Also urinary dribbling. No hematuria. No fever.  No change in back pain. Has had some chills.  Says started after episode of IBS.  No recent ABX.   Review of Systems     Objective:   Physical Exam  Constitutional: She is oriented to person, place, and time. She appears well-developed and well-nourished.  HENT:  Head: Normocephalic and atraumatic.  Abdominal: Soft. Bowel sounds are normal. She exhibits no distension and no mass. There is no tenderness. There is no rebound and no guarding.  Musculoskeletal:       No CVA tenderness   Neurological: She is alert and oriented to person, place, and time.  Skin: Skin is warm and dry.  Psychiatric: She has a normal mood and affect. Her behavior is normal.          Assessment & Plan:  UTI - urinalysis is positive. We'll treat with nitrofurantoin because of her allergies. The she does report she can take Cipro so if she's not improving consider change to Cipro. Call if any palms. Make sure staying hydrated. Handout given.  Hyperlipidemia-she's doing well on the change to atorvastatin. She would like a 90 day prescription sent to her mail order pharmacy. Astra: About a month for a lab slip so that we can recheck her cholesterol.

## 2012-01-20 ENCOUNTER — Ambulatory Visit: Payer: Self-pay | Admitting: Family Medicine

## 2012-02-19 ENCOUNTER — Encounter: Payer: Self-pay | Admitting: Family Medicine

## 2012-02-19 ENCOUNTER — Ambulatory Visit (INDEPENDENT_AMBULATORY_CARE_PROVIDER_SITE_OTHER): Payer: Medicare Other | Admitting: Family Medicine

## 2012-02-19 VITALS — BP 138/78 | HR 62 | Temp 97.8°F

## 2012-02-19 DIAGNOSIS — Z23 Encounter for immunization: Secondary | ICD-10-CM

## 2012-02-19 DIAGNOSIS — N390 Urinary tract infection, site not specified: Secondary | ICD-10-CM

## 2012-02-19 LAB — POCT URINALYSIS DIPSTICK
Bilirubin, UA: NEGATIVE
Blood, UA: NEGATIVE
Glucose, UA: NEGATIVE
Ketones, UA: NEGATIVE
Nitrite, UA: NEGATIVE
Protein, UA: NEGATIVE
Spec Grav, UA: <=1.005
Urobilinogen, UA: 0.2
pH, UA: 6

## 2012-02-19 MED ORDER — ESTRADIOL 0.1 MG/GM VA CREA: 2.0000 g | TOPICAL_CREAM | VAGINAL | Status: DC

## 2012-02-19 MED ORDER — CIPROFLOXACIN HCL 250 MG PO TABS: 250.0000 mg | ORAL_TABLET | Freq: Two times a day (BID) | ORAL | Status: DC

## 2012-02-19 MED ORDER — NITROFURANTOIN MONOHYD MACRO 100 MG PO CAPS: 100.0000 mg | ORAL_CAPSULE | Freq: Two times a day (BID) | ORAL | Status: DC

## 2012-02-19 NOTE — Progress Notes (Signed)
  Subjective:    Patient ID: Dawn Bailey, female    DOB: 1935/01/02, 77 y.o.   MRN: [id number]  HPI 3 days of dyuria and frequency. No hematuria.  No fever.  No back pain with it. This iis her 3rd UTI this year.    Review of Systems     Objective:   Physical Exam  Constitutional: She is oriented to person, place, and time. She appears well-developed and well-nourished.  Cardiovascular: Normal rate, regular rhythm and normal heart sounds.   Musculoskeletal:       No CVA tenderness   Neurological: She is alert and oriented to person, place, and time.  Psychiatric: She has a normal mood and affect. Her behavior is normal.          Assessment & Plan:  UTI - will send a culture this time. Will tx with nitrofurantoin again.  Insurance denied it so will send over cipro 250mg  BID x 3 days. Will send for culture.  Will repeat her UA next Friday at her OV.   Flu vaccine given today.

## 2012-02-19 NOTE — Patient Instructions (Signed)
Urinary Tract Infection Urinary tract infections (UTIs) can develop anywhere along your urinary tract. Your urinary tract is your body's drainage system for removing wastes and extra water. Your urinary tract includes two kidneys, two ureters, a bladder, and a urethra. Your kidneys are a pair of bean-shaped organs. Each kidney is about the size of your fist. They are located below your ribs, one on each side of your spine. CAUSES Infections are caused by microbes, which are microscopic organisms, including fungi, viruses, and bacteria. These organisms are so small that they can only be seen through a microscope. Bacteria are the microbes that most commonly cause UTIs. SYMPTOMS  Symptoms of UTIs may vary by age and gender of the patient and by the location of the infection. Symptoms in young women typically include a frequent and intense urge to urinate and a painful, burning feeling in the bladder or urethra during urination. Older women and men are more likely to be tired, shaky, and weak and have muscle aches and abdominal pain. A fever may mean the infection is in your kidneys. Other symptoms of a kidney infection include pain in your back or sides below the ribs, nausea, and vomiting. DIAGNOSIS To diagnose a UTI, your caregiver will ask you about your symptoms. Your caregiver also will ask to provide a urine sample. The urine sample will be tested for bacteria and white blood cells. White blood cells are made by your body to help fight infection. TREATMENT  Typically, UTIs can be treated with medication. Because most UTIs are caused by a bacterial infection, they usually can be treated with the use of antibiotics. The choice of antibiotic and length of treatment depend on your symptoms and the type of bacteria causing your infection. HOME CARE INSTRUCTIONS  If you were prescribed antibiotics, take them exactly as your caregiver instructs you. Finish the medication even if you feel better after you  have only taken some of the medication.  Drink enough water and fluids to keep your urine clear or pale yellow.  Avoid caffeine, tea, and carbonated beverages. They tend to irritate your bladder.  Empty your bladder often. Avoid holding urine for long periods of time.  Empty your bladder before and after sexual intercourse.  After a bowel movement, women should cleanse from front to back. Use each tissue only once. SEEK MEDICAL CARE IF:   You have back pain.  You develop a fever.  Your symptoms do not begin to resolve within 3 days. SEEK IMMEDIATE MEDICAL CARE IF:   You have severe back pain or lower abdominal pain.  You develop chills.  You have nausea or vomiting.  You have continued burning or discomfort with urination. MAKE SURE YOU:   Understand these instructions.  Will watch your condition.  Will get help right away if you are not doing well or get worse. Document Released: 01/28/2005 Document Revised: 10/20/2011 Document Reviewed: 05/29/2011 [employer] Patient Information 2013 [employer].  

## 2012-02-22 LAB — URINE CULTURE: Colony Count: >=100000

## 2012-02-26 ENCOUNTER — Ambulatory Visit (INDEPENDENT_AMBULATORY_CARE_PROVIDER_SITE_OTHER): Payer: Medicare Other | Admitting: Family Medicine

## 2012-02-26 ENCOUNTER — Encounter: Payer: Self-pay | Admitting: Family Medicine

## 2012-02-26 VITALS — BP 96/65 | HR 77 | Ht 66.5 in | Wt 206.0 lb

## 2012-02-26 DIAGNOSIS — E785 Hyperlipidemia, unspecified: Secondary | ICD-10-CM

## 2012-02-26 DIAGNOSIS — R350 Frequency of micturition: Secondary | ICD-10-CM

## 2012-02-26 DIAGNOSIS — G43909 Migraine, unspecified, not intractable, without status migrainosus: Secondary | ICD-10-CM

## 2012-02-26 DIAGNOSIS — N390 Urinary tract infection, site not specified: Secondary | ICD-10-CM

## 2012-02-26 LAB — LIPID PANEL
Cholesterol: 181 mg/dL (ref 0–200)
HDL: 62 mg/dL (ref >39)
LDL Cholesterol: 94 mg/dL (ref 0–99)
Total CHOL/HDL Ratio: 2.9 Ratio
Triglycerides: 125 mg/dL (ref <150)
VLDL: 25 mg/dL (ref 0–40)

## 2012-02-26 LAB — POCT URINALYSIS DIPSTICK
Bilirubin, UA: NEGATIVE
Glucose, UA: NEGATIVE
Ketones, UA: NEGATIVE
Nitrite, UA: NEGATIVE
Protein, UA: NEGATIVE
Spec Grav, UA: 1.015
Urobilinogen, UA: 0.2
pH, UA: 6

## 2012-02-26 MED ORDER — CIPROFLOXACIN HCL 250 MG PO TABS: 250.0000 mg | ORAL_TABLET | Freq: Two times a day (BID) | ORAL | Status: DC

## 2012-02-26 MED ORDER — CARISOPRODOL 350 MG PO TABS: 350.0000 mg | ORAL_TABLET | Freq: Every day | ORAL | Status: DC | PRN

## 2012-02-26 NOTE — Addendum Note (Signed)
Addended by: Judie Petit A on: 02/26/2012 09:48 AM   Modules accepted: Orders

## 2012-02-26 NOTE — Progress Notes (Signed)
  Subjective:    Patient ID: Dawn Bailey, female    DOB: 1935-03-26, 77 y.o.   MRN: [id number]  HPI UTI- says the burning is gone but still noticing the urine and stopping adn going and sometimes feels baldder if full but then when tries to urinate there is very little is there. She did complete the antibiotic. Her urine culture was positive for Escherichia coli and it was sensitive to ciprofloxacin. She's not had any fever or low back pain.  She has had frequent migraines over the last 2 weeks. She says she had one that lasts about 4 days. She wants and if this happens again if she would be able to come in for injection. She would also like a refill on the soma which she uses for her migraines.   Review of Systems     Objective:   Physical Exam  Constitutional: She appears well-developed and well-nourished.  Skin: Skin is warm and dry.  Psychiatric: She has a normal mood and affect. Her behavior is normal.          Assessment & Plan:  UTI - UA only + for LE. Will send or cultlure to cofirm clearrance. If neg then will refer to Urology for futher eval for possible bloadder outlet obstruction vs spasmodic bladder.  If it is positive then we will retreat for a longer course. I did give her a paper prescription for Cipro to take this weekend if she feels worse, otherwise she will hold off on filling it to we get the culture back.  Urinary frequency - See above.    Migraines - we will be happy to give her an injection when she has an acute migraine is lasting within 24-48 hours. I did refill her soma today. Call if she's having any more problems.  Hyperlipidemia-she's doing well on her new statin. Due today to recheck her lipids to make sure there well-controlled on her current regimen.

## 2012-02-27 LAB — COMPLETE METABOLIC PANEL WITH GFR
ALT: 9 U/L (ref 0–35)
AST: 11 U/L (ref 0–37)
Albumin: 4.3 g/dL (ref 3.5–5.2)
Alkaline Phosphatase: 82 U/L (ref 39–117)
BUN: 12 mg/dL (ref 6–23)
CO2: 27 mEq/L (ref 19–32)
Calcium: 9.4 mg/dL (ref 8.4–10.5)
Chloride: 96 mEq/L (ref 96–112)
Creat: 0.85 mg/dL (ref 0.50–1.10)
GFR, Est African American: 78 mL/min
GFR, Est Non African American: 67 mL/min
Glucose, Bld: 76 mg/dL (ref 70–99)
Potassium: 4.5 mEq/L (ref 3.5–5.3)
Sodium: 132 mEq/L — ABNORMAL LOW (ref 135–145)
Total Bilirubin: 0.5 mg/dL (ref 0.3–1.2)
Total Protein: 7 g/dL (ref 6.0–8.3)

## 2012-02-29 ENCOUNTER — Telehealth: Payer: Self-pay | Admitting: *Deleted

## 2012-02-29 NOTE — Telephone Encounter (Signed)
Message copied by Florestine Avers on Mon Feb 29, 2012  9:09 AM ------      Message from: Nani Gasser D      Created: Sun Feb 28, 2012  9:09 PM       Call pt: Chol looks great!!!  Sodium is a little low. Not sure why.  REcheck in 2 weeks.

## 2012-02-29 NOTE — Telephone Encounter (Signed)
Patient notified

## 2012-03-03 LAB — URINE CULTURE
Colony Count: NO GROWTH
Organism ID, Bacteria: NO GROWTH

## 2012-03-16 ENCOUNTER — Emergency Department: Admission: EM | Admit: 2012-03-16 | Discharge: 2012-03-16 | Payer: Medicare Other | Source: Home / Self Care

## 2012-03-16 ENCOUNTER — Encounter: Payer: Self-pay | Admitting: Emergency Medicine

## 2012-03-16 NOTE — ED Notes (Signed)
Dysuria x 3 days 

## 2012-03-17 ENCOUNTER — Ambulatory Visit (INDEPENDENT_AMBULATORY_CARE_PROVIDER_SITE_OTHER): Payer: Medicare Other

## 2012-03-17 ENCOUNTER — Ambulatory Visit (INDEPENDENT_AMBULATORY_CARE_PROVIDER_SITE_OTHER): Payer: Medicare Other | Admitting: Sports Medicine

## 2012-03-17 ENCOUNTER — Encounter: Payer: Self-pay | Admitting: Sports Medicine

## 2012-03-17 ENCOUNTER — Telehealth: Payer: Self-pay | Admitting: *Deleted

## 2012-03-17 VITALS — BP 115/68 | HR 75 | Wt 200.0 lb

## 2012-03-17 DIAGNOSIS — R197 Diarrhea, unspecified: Secondary | ICD-10-CM

## 2012-03-17 DIAGNOSIS — R300 Dysuria: Secondary | ICD-10-CM

## 2012-03-17 DIAGNOSIS — R109 Unspecified abdominal pain: Secondary | ICD-10-CM

## 2012-03-17 DIAGNOSIS — K589 Irritable bowel syndrome without diarrhea: Secondary | ICD-10-CM

## 2012-03-17 MED ORDER — PROMETHAZINE HCL 25 MG PO TABS: 25.0000 mg | ORAL_TABLET | Freq: Four times a day (QID) | ORAL | Status: DC | PRN

## 2012-03-17 MED ORDER — CIPROFLOXACIN HCL 750 MG PO TABS: 750.0000 mg | ORAL_TABLET | Freq: Two times a day (BID) | ORAL | Status: DC

## 2012-03-17 MED ORDER — PHENAZOPYRIDINE HCL 200 MG PO TABS: 200.0000 mg | ORAL_TABLET | Freq: Three times a day (TID) | ORAL | Status: AC

## 2012-03-17 NOTE — Assessment & Plan Note (Signed)
This is still likely related to irritable bowel syndrome, with several courses of antibiotics, and now worsening diarrhea would need to suspect C. Difficile. She also has some symptoms that are suggestive of lactose intolerance. Symptoms have been present for 5 weeks now. Checking abdominal x-ray, C. difficile, stool studies. Phenergan for nausea.

## 2012-03-17 NOTE — Assessment & Plan Note (Signed)
Ciprofloxacin 750 mg twice a day for 14 days. Phenazopyridine. Urinalysis was collected in urgent care, but was not sent for culture. I would like to send a urine culture today.

## 2012-03-17 NOTE — Addendum Note (Signed)
Addended by: Monica Becton on: 03/17/2012 11:08 AM   Modules accepted: Orders

## 2012-03-17 NOTE — Progress Notes (Signed)
Subjective:    CC: f/u  HPI:  Dysuria: Has been present for several weeks now, she is seen other providers, has been 3 short course of ciprofloxacin, has a urine culture 3 weeks ago and sensitive Escherichia coli, and most recently negative. She was seen in urgent care last night, pyuria was noted on urinalysis, but no culture was sent. She returns today with persistence of symptoms. She denies flank pain, denies fevers, chills, rash. Denies vaginal discharge.  Diarrhea: Has been present for a long time, she doesn't history of irritable bowel syndrome, unfortunately has worsened over the past 5 weeks. It is not normal for her. She gets occasional abdominal pain, and abdominal cramping, stools are frequent, multiple times per day, but are nonbloody. She has had several courses of antibiotics recently. She does note some worsening of symptoms with dairy products. She desires to be tested for common food allergies.  She is nauseated, but denies vomiting. She denies any rash.  Past medical history, Surgical history, Family history, Social history, Allergies, and medications have been entered into the medical record, reviewed, and no changes needed.   Review of Systems: No fevers, chills, night sweats, weight loss, chest pain, or shortness of breath.   Objective:    General: Well Developed, well nourished, and in no acute distress.  Neuro: Alert and oriented x3, extra-ocular muscles intact.  HEENT: Normocephalic, atraumatic, pupils equal round reactive to light, neck supple, no masses, no lymphadenopathy, thyroid nonpalpable.  Skin: Warm and dry, no rashes. Cardiac: Regular rate and rhythm, no murmurs rubs or gallops.  Respiratory: Clear to auscultation bilaterally. Not using accessory muscles, speaking in full sentences. Abdomen: Soft, mildly tender to palpation in the left lower, and right lower quadrants. No suprapubic tenderness. Normal bowel sounds, no costovertebral angle tenderness, negative  Lloyd's punch.  Impression and Recommendations:

## 2012-03-18 LAB — CBC WITH DIFFERENTIAL/PLATELET
Basophils Absolute: 0 10*3/uL (ref 0.0–0.1)
Basophils Relative: 0 % (ref 0–1)
Eosinophils Absolute: 0 10*3/uL (ref 0.0–0.7)
Eosinophils Relative: 0 % (ref 0–5)
HCT: 37.3 % (ref 36.0–46.0)
Hemoglobin: 12.5 g/dL (ref 12.0–15.0)
Lymphocytes Relative: 21 % (ref 12–46)
Lymphs Abs: 1.2 10*3/uL (ref 0.7–4.0)
MCH: 26.2 pg (ref 26.0–34.0)
MCHC: 33.5 g/dL (ref 30.0–36.0)
MCV: 78.2 fL (ref 78.0–100.0)
Monocytes Absolute: 0.4 10*3/uL (ref 0.1–1.0)
Monocytes Relative: 8 % (ref 3–12)
Neutro Abs: 3.9 10*3/uL (ref 1.7–7.7)
Neutrophils Relative %: 71 % (ref 43–77)
Platelets: 182 10*3/uL (ref 150–400)
RBC: 4.77 MIL/uL (ref 3.87–5.11)
RDW: 15.6 % — ABNORMAL HIGH (ref 11.5–15.5)
WBC: 5.6 10*3/uL (ref 4.0–10.5)

## 2012-03-18 LAB — COMPREHENSIVE METABOLIC PANEL
ALT: 16 U/L (ref 0–35)
AST: 27 U/L (ref 0–37)
Albumin: 4.6 g/dL (ref 3.5–5.2)
Alkaline Phosphatase: 76 U/L (ref 39–117)
BUN: 5 mg/dL — ABNORMAL LOW (ref 6–23)
CO2: 21 mEq/L (ref 19–32)
Calcium: 9.3 mg/dL (ref 8.4–10.5)
Chloride: 96 mEq/L (ref 96–112)
Creat: 0.68 mg/dL (ref 0.50–1.10)
Glucose, Bld: 84 mg/dL (ref 70–99)
Potassium: 4.2 mEq/L (ref 3.5–5.3)
Sodium: 130 mEq/L — ABNORMAL LOW (ref 135–145)
Total Bilirubin: 0.6 mg/dL (ref 0.3–1.2)
Total Protein: 6.9 g/dL (ref 6.0–8.3)

## 2012-03-18 LAB — LIPASE: Lipase: 17 U/L (ref 0–75)

## 2012-03-18 LAB — ANCA SCREEN W REFLEX TITER
Atypical p-ANCA Screen: NEGATIVE
c-ANCA Screen: NEGATIVE
p-ANCA Screen: NEGATIVE

## 2012-03-18 LAB — GLIA (IGA/G) + TTG IGA
Deamidated Gliadin Abs, IgG: 5.2 U/mL (ref <20)
Gliadin IgA: 7.3 U/mL (ref <20)
Tissue Transglutaminase Ab, IgA: 5 U/mL (ref <20)

## 2012-03-19 LAB — URINALYSIS
Bilirubin Urine: NEGATIVE
Glucose, UA: NEGATIVE mg/dL
Nitrite: POSITIVE — AB
Protein, ur: NEGATIVE mg/dL
Specific Gravity, Urine: 1.007 (ref 1.005–1.030)
Urobilinogen, UA: 0.2 mg/dL (ref 0.0–1.0)
pH: 6.5 (ref 5.0–8.0)

## 2012-03-19 LAB — URINE CULTURE: Colony Count: >=100000

## 2012-03-21 LAB — SACCHAROMYCES CEREVISIAE ANTIBODIES, IGG AND IGA
Saccharomyces cerevisiae IgG: 7.3 Units (ref <=20.0)
Saccharomyces cerevisiae, IgA: 97.9 Units — ABNORMAL HIGH (ref <=20.0)

## 2012-03-21 NOTE — Telephone Encounter (Signed)
error 

## 2012-03-22 ENCOUNTER — Encounter: Payer: Self-pay | Admitting: Sports Medicine

## 2012-03-25 ENCOUNTER — Encounter: Payer: Self-pay | Admitting: Sports Medicine

## 2012-03-25 ENCOUNTER — Telehealth: Payer: Self-pay | Admitting: *Deleted

## 2012-03-25 DIAGNOSIS — R197 Diarrhea, unspecified: Secondary | ICD-10-CM

## 2012-03-25 LAB — CLOSTRIDIUM DIFFICILE EIA: CDIFTX: NEGATIVE

## 2012-03-29 LAB — STOOL CULTURE

## 2012-04-04 ENCOUNTER — Encounter: Payer: Self-pay | Admitting: Sports Medicine

## 2012-04-18 ENCOUNTER — Other Ambulatory Visit: Payer: Self-pay | Admitting: Family Medicine

## 2012-04-18 ENCOUNTER — Encounter: Payer: Self-pay | Admitting: Family Medicine

## 2012-04-18 ENCOUNTER — Ambulatory Visit (INDEPENDENT_AMBULATORY_CARE_PROVIDER_SITE_OTHER): Payer: Medicare Other | Admitting: Family Medicine

## 2012-04-18 VITALS — BP 130/71 | HR 81 | Ht 66.5 in | Wt 193.0 lb

## 2012-04-18 DIAGNOSIS — R197 Diarrhea, unspecified: Secondary | ICD-10-CM

## 2012-04-18 DIAGNOSIS — K589 Irritable bowel syndrome without diarrhea: Secondary | ICD-10-CM

## 2012-04-18 DIAGNOSIS — K529 Noninfective gastroenteritis and colitis, unspecified: Secondary | ICD-10-CM

## 2012-04-18 DIAGNOSIS — I10 Essential (primary) hypertension: Secondary | ICD-10-CM

## 2012-04-18 MED ORDER — ALPRAZOLAM 0.5 MG PO TABS: 0.5000 mg | ORAL_TABLET | Freq: Two times a day (BID) | ORAL | Status: DC | PRN

## 2012-04-18 NOTE — Progress Notes (Signed)
  Subjective:    Patient ID: Dawn Bailey, female    DOB: 05/27/1934, 77 y.o.   MRN: [id number]  HPI Followup chronic diarrhea-she's on Thekkekandam about 3 or 4 weeks ago. He did do a fair amount of blood work. She's here to discuss his results today. She is concerned about the possibility of Crohn's disease. Did start a probiotic.  She is till having frequent episodes of diarrhea. Has question aobut her bentyl as well. Wants to make sure ok to take with Immodium. Had normal sigmoidoscopy about 3 years ago.  Occ soils herself bc doesn't make it to the bathroom at the time.  Has cut out ice cream but eats yogurt. Has been trying ot cut out artificial sugars  Did use her bentyl yesterday. Unfortunately is getting to the point where she is fearful about leaving the house because she's worried there's not a bathroom nearby.  Hypertension-no chest pain or shortness of breath. She is doing well on current medication regimen. Review of Systems     Objective:   Physical Exam  Constitutional: She is oriented to person, place, and time. She appears well-developed and well-nourished.  HENT:  Head: Normocephalic and atraumatic.  Cardiovascular: Normal rate, regular rhythm and normal heart sounds.   Pulmonary/Chest: Effort normal and breath sounds normal.  Neurological: She is alert and oriented to person, place, and time.  Skin: Skin is warm and dry.  Psychiatric: She has a normal mood and affect. Her behavior is normal.          Assessment & Plan:  Chronic diarrhea-unfortunately we will have to reorder the blood work. Did not resolve. I would still like to test her for milk allergy as well as celiac disease. We will also check for signs of Crohn's disease. Her stool cultures came back negative for Salmonella Shigella and Escherichia coli which is also reassuring. We also discussed that she may have irritable bowel syndrome which is often times triggered by certain foods. Making some major dietary  changes can help. Also discussed with her that the test for celiac and milk allergy or not 100% sensitive thus even though the test may come back normal she might want to consider going gluten free and possibly dairy free for at least a month to see if her symptoms significantly improved. She can continue the Bentyl on a when necessary basis since she doesn't exactly like the way it makes her feel when she takes it but it does seem to help her symptoms. She started on sertraline which often helps IBS symptoms. She does feel a probiotic has been helping somewhat. Continue for now. Certainly she can use Imodium as needed.  Hypertension-well-controlled. Continue current regimen.

## 2012-04-19 ENCOUNTER — Encounter: Payer: Self-pay | Admitting: Family Medicine

## 2012-04-19 ENCOUNTER — Other Ambulatory Visit: Payer: Self-pay | Admitting: Family Medicine

## 2012-04-19 ENCOUNTER — Telehealth: Payer: Self-pay | Admitting: *Deleted

## 2012-04-19 ENCOUNTER — Other Ambulatory Visit: Payer: Self-pay | Admitting: *Deleted

## 2012-04-19 DIAGNOSIS — D649 Anemia, unspecified: Secondary | ICD-10-CM

## 2012-04-19 DIAGNOSIS — D509 Iron deficiency anemia, unspecified: Secondary | ICD-10-CM

## 2012-04-19 LAB — CBC WITH DIFFERENTIAL/PLATELET
Basophils Absolute: 0 10*3/uL (ref 0.0–0.1)
Basophils Relative: 0 % (ref 0–1)
Eosinophils Absolute: 0 10*3/uL (ref 0.0–0.7)
Eosinophils Relative: 1 % (ref 0–5)
HCT: 35.8 % — ABNORMAL LOW (ref 36.0–46.0)
Hemoglobin: 11.9 g/dL — ABNORMAL LOW (ref 12.0–15.0)
Lymphocytes Relative: 43 % (ref 12–46)
Lymphs Abs: 1.6 10*3/uL (ref 0.7–4.0)
MCH: 25.4 pg — ABNORMAL LOW (ref 26.0–34.0)
MCHC: 33.2 g/dL (ref 30.0–36.0)
MCV: 76.5 fL — ABNORMAL LOW (ref 78.0–100.0)
Monocytes Absolute: 0.3 10*3/uL (ref 0.1–1.0)
Monocytes Relative: 9 % (ref 3–12)
Neutro Abs: 1.8 10*3/uL (ref 1.7–7.7)
Neutrophils Relative %: 47 % (ref 43–77)
Platelets: 194 10*3/uL (ref 150–400)
RBC: 4.68 MIL/uL (ref 3.87–5.11)
RDW: 15.8 % — ABNORMAL HIGH (ref 11.5–15.5)
WBC: 3.8 10*3/uL — ABNORMAL LOW (ref 4.0–10.5)

## 2012-04-19 LAB — GLIA (IGA/G) + TTG IGA
Gliadin IgA: 9 U/mL (ref <20)
Gliadin IgG: 3.8 U/mL (ref <20)
Tissue Transglutaminase Ab, IgA: 5.4 U/mL (ref <20)

## 2012-04-19 LAB — ANCA SCREEN W REFLEX TITER
Atypical p-ANCA Screen: NEGATIVE
c-ANCA Screen: NEGATIVE
p-ANCA Screen: NEGATIVE

## 2012-04-19 LAB — ALLERGEN MILK: Milk IgE: 0.42 kU/L — ABNORMAL HIGH

## 2012-04-19 MED ORDER — CARISOPRODOL 350 MG PO TABS: 350.0000 mg | ORAL_TABLET | Freq: Every day | ORAL | Status: DC | PRN

## 2012-04-19 NOTE — Progress Notes (Addendum)
  Subjective:    Patient ID: Dawn Bailey, female    DOB: 1934/05/13, 77 y.o.   MRN: [id number]  HPI  Call lab and see why the order was canceled.    Review of Systems     Objective:   Physical Exam        Assessment & Plan:

## 2012-04-19 NOTE — Addendum Note (Signed)
Addended by: Nani Gasser D on: 04/19/2012 02:45 PM   Modules accepted: Level of Service

## 2012-04-20 LAB — FOLATE: Folate: 10.3 ng/mL

## 2012-04-20 LAB — VITAMIN B12: Vitamin B-12: 923 pg/mL — ABNORMAL HIGH (ref 211–911)

## 2012-04-20 LAB — FERRITIN: Ferritin: 35 ng/mL (ref 10–291)

## 2012-04-21 NOTE — Telephone Encounter (Signed)
See other note

## 2012-04-22 ENCOUNTER — Telehealth: Payer: Self-pay | Admitting: *Deleted

## 2012-04-22 ENCOUNTER — Other Ambulatory Visit: Payer: Self-pay | Admitting: Family Medicine

## 2012-04-22 LAB — URINE CULTURE: Colony Count: >=100000

## 2012-04-22 MED ORDER — NITROFURANTOIN MACROCRYSTAL 100 MG PO CAPS: 100.0000 mg | ORAL_CAPSULE | Freq: Four times a day (QID) | ORAL | Status: DC

## 2012-04-22 NOTE — Telephone Encounter (Signed)
The pharmacy called and the antibiotic needs a prior auth. The pharmacist did say they could match [facility]'s price on their prescription plan.

## 2012-04-22 NOTE — Telephone Encounter (Signed)
Discussed pending lab information with Miyoshi. Will call back with sensitivities. Her symptoms are not much improved. She has been eating yogurt as discussed previously.

## 2012-04-22 NOTE — Telephone Encounter (Signed)
Sent over levaquin. No PA required.

## 2012-04-24 LAB — SACCHAROMYCES CEREVISIAE ANTIBODIES, IGG AND IGA
Saccharomyces cerevisiae IgG: 7.2 Units (ref <=20.0)
Saccharomyces cerevisiae, IgA: 8.5 Units (ref <=20.0)

## 2012-04-28 ENCOUNTER — Encounter: Payer: Self-pay | Admitting: Family Medicine

## 2012-04-29 ENCOUNTER — Telehealth: Payer: Self-pay | Admitting: Family Medicine

## 2012-04-29 NOTE — Telephone Encounter (Signed)
Call pt: B12 is high. Stop any b12 supplements. REcheck in 2 months.  Folate is nl. Iron stores are on low end of normal. Work on increasing iron in diet.Also please call lab and see why they havent' resultsed her celiac panel.

## 2012-05-02 NOTE — Telephone Encounter (Signed)
Patient advised of results.

## 2012-05-16 ENCOUNTER — Telehealth: Payer: Self-pay | Admitting: Gastroenterology

## 2012-05-16 NOTE — Telephone Encounter (Signed)
Forward 5 pages from [facility] to Dr. Melvia Heaps for review on 05-16-12 ym

## 2012-07-01 ENCOUNTER — Other Ambulatory Visit: Payer: Self-pay | Admitting: *Deleted

## 2012-07-01 ENCOUNTER — Telehealth: Payer: Self-pay | Admitting: *Deleted

## 2012-07-01 NOTE — Telephone Encounter (Signed)
Called pt to get clarification on the medication that she called about (Risperidon) because this is not on her med list. I LVM asking her to call back to clarify name of medication, strength, and dosage.,.Heath Gold

## 2012-07-11 ENCOUNTER — Encounter: Payer: Self-pay | Admitting: Family Medicine

## 2012-07-11 ENCOUNTER — Other Ambulatory Visit: Payer: Self-pay | Admitting: Family Medicine

## 2012-07-11 ENCOUNTER — Ambulatory Visit (INDEPENDENT_AMBULATORY_CARE_PROVIDER_SITE_OTHER): Payer: Medicare Other | Admitting: Family Medicine

## 2012-07-11 VITALS — BP 113/64 | HR 72 | Ht 66.6 in | Wt 193.0 lb

## 2012-07-11 DIAGNOSIS — G4700 Insomnia, unspecified: Secondary | ICD-10-CM

## 2012-07-11 DIAGNOSIS — M791 Myalgia, unspecified site: Secondary | ICD-10-CM

## 2012-07-11 DIAGNOSIS — IMO0001 Reserved for inherently not codable concepts without codable children: Secondary | ICD-10-CM

## 2012-07-11 DIAGNOSIS — K589 Irritable bowel syndrome without diarrhea: Secondary | ICD-10-CM

## 2012-07-11 LAB — COMPLETE METABOLIC PANEL WITH GFR
ALT: 15 U/L (ref 0–35)
AST: 21 U/L (ref 0–37)
Albumin: 4.6 g/dL (ref 3.5–5.2)
Alkaline Phosphatase: 97 U/L (ref 39–117)
BUN: 11 mg/dL (ref 6–23)
CO2: 24 mEq/L (ref 19–32)
Calcium: 9.4 mg/dL (ref 8.4–10.5)
Chloride: 102 mEq/L (ref 96–112)
Creat: 0.72 mg/dL (ref 0.50–1.10)
GFR, Est African American: >89 mL/min
GFR, Est Non African American: 82 mL/min
Glucose, Bld: 91 mg/dL (ref 70–99)
Potassium: 4.6 mEq/L (ref 3.5–5.3)
Sodium: 138 mEq/L (ref 135–145)
Total Bilirubin: 0.5 mg/dL (ref 0.3–1.2)
Total Protein: 7.1 g/dL (ref 6.0–8.3)

## 2012-07-11 LAB — CBC
HCT: 37.6 % (ref 36.0–46.0)
Hemoglobin: 12.3 g/dL (ref 12.0–15.0)
MCH: 25.1 pg — ABNORMAL LOW (ref 26.0–34.0)
MCHC: 32.7 g/dL (ref 30.0–36.0)
MCV: 76.6 fL — ABNORMAL LOW (ref 78.0–100.0)
Platelets: 175 10*3/uL (ref 150–400)
RBC: 4.91 MIL/uL (ref 3.87–5.11)
RDW: 16.4 % — ABNORMAL HIGH (ref 11.5–15.5)
WBC: 4.5 10*3/uL (ref 4.0–10.5)

## 2012-07-11 LAB — CK: Total CK: 101 U/L (ref 7–177)

## 2012-07-11 MED ORDER — TRAZODONE HCL 50 MG PO TABS: 75.0000 mg | ORAL_TABLET | Freq: Every day | ORAL | Status: DC

## 2012-07-11 NOTE — Progress Notes (Signed)
  Subjective:    Patient ID: Dawn Bailey, female    DOB: 1934-05-09, 78 y.o.   MRN: [id number]  HPI She was tested for milk allergy about 3 months ago and says she has been avoiding milk products and says overall she does feel better overall.  She is still having some episodes of diarrhea and says will occ use immodium. Also using a probiotic and feels that helps too.  Rarely uses her bentyl, because it does seem to make her lightheaded and dizzy. She only uses it at bedtime if needed.  Insomnia-she would like to try increasing her trazodone to one half tabs. We discussed is a previous visit of the time she wanted to hold off on any changes.  Feels she is getting muscle aches on her lipitor. Says skipped last night with the pill. Has been waking up with pain in her upper thighs and bilateral back from upper to lower back.  She says this was similar to what she experienced on the simvastatin in the past. She said what and skip her pill last night and actually did sleep a little bit better. She denies any actual weakness or recent injuries. She wants to know if she could try a non-statin medication to help control her cholesterol.  Review of Systems     Objective:   Physical Exam  Constitutional: She is oriented to person, place, and time. She appears well-developed and well-nourished.  HENT:  Head: Normocephalic and atraumatic.  Cardiovascular: Normal rate, regular rhythm and normal heart sounds.   Pulmonary/Chest: Effort normal and breath sounds normal.  Neurological: She is alert and oriented to person, place, and time.  Skin: Skin is warm and dry.  Psychiatric: She has a normal mood and affect. Her behavior is normal.          Assessment & Plan:  Diarrhea -I really think she has IBS. She is better since taking dairy out of her diet. She feels like it really has made a big difference. She does have her Imodium and her Bentyl if really needed for the diarrhea episodes. We also  discussed trying to track triggers and trying to avoid these are minimized if at all possible. Also check a stress levels down as is typically aggravates her verbal bowel syndrome as well. Make sure getting daily adequate fiber to help keep stools more formed.  Myalgias-she feels she's getting ounces again on the Lipitor or similar to what she had on the simvastatin. I did go ahead and add Lipitor to her intolerance list. I would like to check a CK today as well as a CBC. We discussed that not statins do not have the morbidity or mortality benefits that statins do and was strongly encouraged her to consider trying a third statin if at all possible. I think Livalo would be a good choice for her. I want her to stop her Lipitor for the next couple weeks and try to get it completely out of her system and see if she feels better. If she does then I would like her to call for one month of samples of Livalo.  Insomnia-increase trazodone to one half tabs. Please call if having any problems or feeling any excess sedation when she wakes up in the morning. New prescriptions sent to pharmacy.

## 2012-07-12 LAB — SEDIMENTATION RATE: Sed Rate: 5 mm/hr (ref 0–22)

## 2012-07-12 LAB — FERRITIN: Ferritin: 43 ng/mL (ref 10–291)

## 2012-08-15 ENCOUNTER — Telehealth: Payer: Self-pay | Admitting: *Deleted

## 2012-08-15 ENCOUNTER — Ambulatory Visit (INDEPENDENT_AMBULATORY_CARE_PROVIDER_SITE_OTHER): Payer: Medicare Other | Admitting: Sports Medicine

## 2012-08-15 ENCOUNTER — Encounter: Payer: Self-pay | Admitting: Sports Medicine

## 2012-08-15 VITALS — BP 144/80 | HR 83 | Temp 98.6°F | Wt 196.0 lb

## 2012-08-15 DIAGNOSIS — J320 Chronic maxillary sinusitis: Secondary | ICD-10-CM

## 2012-08-15 MED ORDER — BENZONATATE 200 MG PO CAPS: 200.0000 mg | ORAL_CAPSULE | Freq: Three times a day (TID) | ORAL | Status: DC | PRN

## 2012-08-15 MED ORDER — AZITHROMYCIN 250 MG PO TABS: ORAL_TABLET | ORAL | Status: DC

## 2012-08-15 MED ORDER — PREDNISONE 50 MG PO TABS: 50.0000 mg | ORAL_TABLET | Freq: Every day | ORAL | Status: DC

## 2012-08-15 MED ORDER — FLUTICASONE PROPIONATE 50 MCG/ACT NA SUSP: NASAL | Status: DC

## 2012-08-15 NOTE — Progress Notes (Signed)
  Subjective:    CC: Sick  HPI: Dawn Bailey comes in with several days of pain and pressure behind her maxillary sinuses, runny nose, mild cough, no body aches, no other constitutional symptoms. Pain does radiate to her ear and upper teeth predominantly on the left side. Pain is moderate. Not relieved with over-the-counter cold and flu medications.  Past medical history, Surgical history, Family history not pertinant except as noted below, Social history, Allergies, and medications have been entered into the medical record, reviewed, and no changes needed.   Review of Systems: No fevers, chills, night sweats, weight loss, chest pain, or shortness of breath.   Objective:    General: Well Developed, well nourished, and in no acute distress.  Neuro: Alert and oriented x3, extra-ocular muscles intact, sensation grossly intact.  HEENT: Normocephalic, atraumatic, pupils equal round reactive to light, neck supple, no masses, no lymphadenopathy, thyroid nonpalpable. Oropharynx and external ear canals are unremarkable, nasopharynx shows boggy turbinates with clear discharge. Tender to palpation over the maxillary sinuses. Skin: Warm and dry, no rashes. Cardiac: Regular rate and rhythm, no murmurs rubs or gallops, no lower extremity edema.  Respiratory: Clear to auscultation bilaterally. Not using accessory muscles, speaking in full sentences. Impression and Recommendations:

## 2012-08-15 NOTE — Telephone Encounter (Signed)
Yes this is okay, please fill.

## 2012-08-15 NOTE — Assessment & Plan Note (Signed)
Zyrtec, prednisone, Flonase, azithromycin, Tessalon Perles. Return an as-needed basis if no better in 2 weeks.

## 2012-08-15 NOTE — Telephone Encounter (Signed)
Pharmacist notified ok to fill. Barry Dienes, LPN

## 2012-08-15 NOTE — Telephone Encounter (Signed)
Pharmacist calls and states interaction with Azithromycin and trazadone. Just wanted to call and verify this is ok to fill

## 2012-09-02 ENCOUNTER — Telehealth: Payer: Self-pay | Admitting: *Deleted

## 2012-09-02 NOTE — Telephone Encounter (Signed)
Pt was having a lingering migraine doing better now. Would like to come in to discuss this with Dr. Linford Arnold appt made.Loralee Pacas [place]

## 2012-09-08 ENCOUNTER — Encounter: Payer: Self-pay | Admitting: Family Medicine

## 2012-09-08 ENCOUNTER — Ambulatory Visit (INDEPENDENT_AMBULATORY_CARE_PROVIDER_SITE_OTHER): Payer: Medicare Other | Admitting: Family Medicine

## 2012-09-08 ENCOUNTER — Other Ambulatory Visit: Payer: Self-pay | Admitting: Family Medicine

## 2012-09-08 VITALS — BP 117/72 | HR 73 | Ht 66.6 in | Wt 195.0 lb

## 2012-09-08 DIAGNOSIS — F430 Acute stress reaction: Secondary | ICD-10-CM

## 2012-09-08 DIAGNOSIS — G43909 Migraine, unspecified, not intractable, without status migrainosus: Secondary | ICD-10-CM

## 2012-09-08 MED ORDER — ALPRAZOLAM 0.5 MG PO TABS: 0.5000 mg | ORAL_TABLET | Freq: Two times a day (BID) | ORAL | Status: DC | PRN

## 2012-09-08 MED ORDER — METHYLPHENIDATE HCL 10 MG PO TABS: 10.0000 mg | ORAL_TABLET | Freq: Two times a day (BID) | ORAL | Status: DC

## 2012-09-08 MED ORDER — TRAMADOL HCL 50 MG PO TABS: 50.0000 mg | ORAL_TABLET | Freq: Three times a day (TID) | ORAL | Status: DC | PRN
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
## 2012-09-14 ENCOUNTER — Encounter (HOSPITAL_COMMUNITY): Payer: Self-pay

## 2012-09-16 ENCOUNTER — Telehealth: Payer: Self-pay | Admitting: *Deleted

## 2012-09-16 NOTE — Telephone Encounter (Signed)
Prior auth obtained for Dawn Bailey

## 2012-09-27 ENCOUNTER — Telehealth: Payer: Self-pay | Admitting: *Deleted

## 2012-09-27 NOTE — Telephone Encounter (Signed)
Called pt back she lvm about refill for xanax.Laureen Ochs, Viann Shove

## 2012-09-27 NOTE — Telephone Encounter (Signed)
Pt stated that she called the pharmacy and got this taken care of.Laureen Ochs, Viann Shove

## 2012-10-12 ENCOUNTER — Encounter (HOSPITAL_COMMUNITY): Payer: Self-pay | Admitting: Psychiatry

## 2012-10-12 ENCOUNTER — Ambulatory Visit (INDEPENDENT_AMBULATORY_CARE_PROVIDER_SITE_OTHER): Payer: Medicare Other | Admitting: Psychiatry

## 2012-10-12 VITALS — BP 112/78 | Ht 66.5 in | Wt 198.0 lb

## 2012-10-12 DIAGNOSIS — F331 Major depressive disorder, recurrent, moderate: Secondary | ICD-10-CM

## 2012-10-12 DIAGNOSIS — F4310 Post-traumatic stress disorder, unspecified: Secondary | ICD-10-CM

## 2012-10-12 DIAGNOSIS — F332 Major depressive disorder, recurrent severe without psychotic features: Secondary | ICD-10-CM

## 2012-10-12 MED ORDER — SERTRALINE HCL 100 MG PO TABS: 200.0000 mg | ORAL_TABLET | Freq: Every day | ORAL | Status: DC

## 2012-10-12 MED ORDER — CLONAZEPAM 0.5 MG PO TABS: 0.5000 mg | ORAL_TABLET | Freq: Two times a day (BID) | ORAL | Status: DC | PRN
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
## 2012-11-03 ENCOUNTER — Ambulatory Visit (INDEPENDENT_AMBULATORY_CARE_PROVIDER_SITE_OTHER): Payer: Medicare Other | Admitting: Psychiatry

## 2012-11-03 ENCOUNTER — Encounter (HOSPITAL_COMMUNITY): Payer: Self-pay | Admitting: Psychiatry

## 2012-11-03 VITALS — BP 108/72 | Ht 66.5 in | Wt 196.0 lb

## 2012-11-03 DIAGNOSIS — F4310 Post-traumatic stress disorder, unspecified: Secondary | ICD-10-CM

## 2012-11-03 DIAGNOSIS — F331 Major depressive disorder, recurrent, moderate: Secondary | ICD-10-CM

## 2012-11-03 DIAGNOSIS — G4700 Insomnia, unspecified: Secondary | ICD-10-CM

## 2012-11-03 MED ORDER — TRAZODONE HCL 50 MG PO TABS: 75.0000 mg | ORAL_TABLET | Freq: Every day | ORAL | Status: DC

## 2012-11-03 MED ORDER — CLONAZEPAM 0.5 MG PO TABS: 0.5000 mg | ORAL_TABLET | Freq: Three times a day (TID) | ORAL | Status: DC | PRN
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
## 2012-11-08 ENCOUNTER — Encounter: Payer: Self-pay | Admitting: Family Medicine

## 2012-11-08 ENCOUNTER — Ambulatory Visit (INDEPENDENT_AMBULATORY_CARE_PROVIDER_SITE_OTHER): Payer: Medicare Other | Admitting: Family Medicine

## 2012-11-08 VITALS — BP 96/61 | HR 75 | Wt 199.0 lb

## 2012-11-08 DIAGNOSIS — I10 Essential (primary) hypertension: Secondary | ICD-10-CM

## 2012-11-08 DIAGNOSIS — G43909 Migraine, unspecified, not intractable, without status migrainosus: Secondary | ICD-10-CM

## 2012-11-08 DIAGNOSIS — F430 Acute stress reaction: Secondary | ICD-10-CM

## 2012-11-08 DIAGNOSIS — K589 Irritable bowel syndrome without diarrhea: Secondary | ICD-10-CM

## 2012-11-08 MED ORDER — PROMETHAZINE HCL 25 MG PO TABS: 25.0000 mg | ORAL_TABLET | Freq: Four times a day (QID) | ORAL | Status: DC | PRN

## 2012-11-08 NOTE — Patient Instructions (Signed)
Stop your enalapril.

## 2012-11-08 NOTE — Progress Notes (Signed)
  Subjective:    Patient ID: Dawn Bailey, female    DOB: [birth year], 78 y.o.   MRN: [id number]  HPI Last HA was 2 days ago.  Using soma and seems to help some. No insurance issues with that one. Her daughter is there and that  Has been really helpful. No rebound HA. She has had to use the tramadol a few times but still has some left. Her sleep quality has been better recently.  Acute stress - Did read the 36 hour day and says it was really helpful. Her husband has alzheimers and is having sundowning. She has been seeing Dr. Christell Constant downstairs.  She has been very happy with the klonopin and is sleeping better which is fantastic. Using trazodone nightly.   HTN-  Pt denies chest pain, SOB, dizziness, or heart palpitations.  Taking meds as directed w/o problems.  Denies medication side effects.     Review of Systems     Objective:   Physical Exam  Constitutional: She is oriented to person, place, and time. She appears well-developed and well-nourished.  HENT:  Head: Normocephalic and atraumatic.  Cardiovascular: Normal rate, regular rhythm and normal heart sounds.   Pulmonary/Chest: Effort normal and breath sounds normal.  Neurological: She is alert and oriented to person, place, and time.  Skin: Skin is warm and dry.  Psychiatric: She has a normal mood and affect. Her behavior is normal.          Assessment & Plan:  Migraines - Under fair control. She has tried multiple medications. Doing well with soma for now. I did give her prescription of tramadol to use only as needed she says she still has some left and does not need a refill today. Encouraged her to make sure she continues to use them sparingly.  Acute stress-  Continue to see Dr. Christell Constant, change in meds has been really helpful for her. Hopefully getting her stress under control and better sleep quality will improve her migraines as well.  Continue Zoloft and clonazepam.  HTN- BP low today. hOld enalapril. Keep metoprolol bc of  HA history. Followup in 8 weeks to make sure blood pressures are well controlled. She has a monitor her home and encouraged her to check it occasionally and call if the blood pressures are elevated at home.

## 2012-11-29 ENCOUNTER — Other Ambulatory Visit: Payer: Self-pay | Admitting: Family Medicine

## 2013-01-04 ENCOUNTER — Ambulatory Visit (INDEPENDENT_AMBULATORY_CARE_PROVIDER_SITE_OTHER): Payer: Medicare Other | Admitting: Psychiatry

## 2013-01-04 ENCOUNTER — Encounter (HOSPITAL_COMMUNITY): Payer: Self-pay | Admitting: Psychiatry

## 2013-01-04 VITALS — BP 112/74 | Ht 66.5 in | Wt 206.0 lb

## 2013-01-04 DIAGNOSIS — F331 Major depressive disorder, recurrent, moderate: Secondary | ICD-10-CM

## 2013-01-04 DIAGNOSIS — F4310 Post-traumatic stress disorder, unspecified: Secondary | ICD-10-CM
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
## 2013-01-07 ENCOUNTER — Other Ambulatory Visit: Payer: Self-pay | Admitting: Family Medicine

## 2013-01-09 ENCOUNTER — Ambulatory Visit (INDEPENDENT_AMBULATORY_CARE_PROVIDER_SITE_OTHER): Payer: Medicare Other | Admitting: Family Medicine

## 2013-01-09 ENCOUNTER — Encounter: Payer: Self-pay | Admitting: Family Medicine

## 2013-01-09 VITALS — BP 117/70 | HR 75 | Wt 197.0 lb

## 2013-01-09 DIAGNOSIS — I10 Essential (primary) hypertension: Secondary | ICD-10-CM

## 2013-01-09 DIAGNOSIS — K219 Gastro-esophageal reflux disease without esophagitis: Secondary | ICD-10-CM

## 2013-01-09 DIAGNOSIS — Z23 Encounter for immunization: Secondary | ICD-10-CM

## 2013-01-09 MED ORDER — CARISOPRODOL 350 MG PO TABS: ORAL_TABLET | ORAL | Status: DC

## 2013-01-09 MED ORDER — TRAMADOL HCL 50 MG PO TABS: ORAL_TABLET | ORAL | Status: DC

## 2013-01-09 NOTE — Progress Notes (Signed)
  Subjective:    Patient ID: Dawn Bailey, female    DOB: 28-Jul-1934, 78 y.o.   MRN: [id number]  HPI HTN -  Doing well off the enalapril. Pt denies chest pain, SOB, dizziness, or heart palpitations.  Taking meds as directed w/o problems.  Denies medication side effects.    Having some GERD at night and taking baking soda mixed w/ water an it helps. Says feel like her evening meds are coming back up and upsetting her stomach   Review of Systems     Objective:   Physical Exam  Constitutional: She is oriented to person, place, and time. She appears well-developed and well-nourished.  HENT:  Head: Normocephalic and atraumatic.  Cardiovascular: Normal rate, regular rhythm and normal heart sounds.   Pulmonary/Chest: Effort normal and breath sounds normal.  Neurological: She is alert and oriented to person, place, and time.  Skin: Skin is warm and dry.  Psychiatric: She has a normal mood and affect. Her behavior is normal.          Assessment & Plan:  HTN - Well controlled. Will stop the metoprolol.  Remind her betablocker can help her migraines so can restart if notices big difference in HA control. Continue to work on diet and exercise. Discussed low salt diet.  F/U in 3 months to make sure the blood pressure still really well controlled off all blood pressure medication.  GERD - recommend trying to move her medications head by about 30 minutes to see if this makes a difference. If not she can consider taking something like [place]. Though she says that time since have an artificial sweetener that irritates her IBS. I'm just not sure about the baking soda. It is a bicarbonate so it could potentially interfere with her medications. GERD -

## 2013-02-13 ENCOUNTER — Encounter: Payer: Self-pay | Admitting: Family Medicine

## 2013-02-13 ENCOUNTER — Ambulatory Visit (INDEPENDENT_AMBULATORY_CARE_PROVIDER_SITE_OTHER): Payer: Medicare Other | Admitting: Family Medicine

## 2013-02-13 VITALS — BP 113/66 | HR 75 | Wt 211.0 lb

## 2013-02-13 DIAGNOSIS — R21 Rash and other nonspecific skin eruption: Secondary | ICD-10-CM

## 2013-02-13 DIAGNOSIS — B372 Candidiasis of skin and nail: Secondary | ICD-10-CM

## 2013-02-13 DIAGNOSIS — I10 Essential (primary) hypertension: Secondary | ICD-10-CM

## 2013-02-13 MED ORDER — ENALAPRIL MALEATE 5 MG PO TABS: 5.0000 mg | ORAL_TABLET | Freq: Every day | ORAL | Status: DC

## 2013-02-13 MED ORDER — NYSTATIN 100000 UNIT/GM EX POWD: CUTANEOUS | Status: DC

## 2013-02-13 MED ORDER — NYSTATIN-TRIAMCINOLONE 100000-0.1 UNIT/GM-% EX OINT: TOPICAL_OINTMENT | Freq: Two times a day (BID) | CUTANEOUS | Status: DC

## 2013-02-13 MED ORDER — METOPROLOL TARTRATE 25 MG PO TABS: 25.0000 mg | ORAL_TABLET | Freq: Two times a day (BID) | ORAL | Status: DC

## 2013-02-13 NOTE — Progress Notes (Signed)
  Subjective:    Patient ID: Dawn Bailey, female    DOB: 10/30/1934, 78 y.o.   MRN: [id number]  HPI Erythematous itchy rash underneath her breasts x2 months. She says occasionally she'll actually get papules that, between the breast and up onto her upper chest. It can come and go. She's been using an over-the-counter anti-itch cream, likely hydrocortisone. It has not really been helping. Does help with itch a little bit. Has not made the rash go away. No known triggers. She's never had this before.  Hypertension-she had her blood pressure checked elsewhere and it was quite elevated so she decided to restart her enalapril and her metoprolol. She will need refills on these. No chest pain, shortness of breath or palpitations. Otherwise doing well. Review of Systems     Objective:   Physical Exam  Constitutional: She appears well-developed and well-nourished.  HENT:  Head: Normocephalic and atraumatic.  Skin: Skin is warm and dry.     Erythematous rash with areas that are well demarcated and other areas where the rash seems to fade.. No significant scale. Next creation. There were also some erythematous papules with scabbing over the center of the chest between the breast area. These are most consistent with satellite lesions. Note, she does have large pendulous breasts.  Psychiatric: She has a normal mood and affect. Her behavior is normal.          Assessment & Plan:  Skin candidal infection-will treat with nystatin powder under the breasts and nystatin/times appointment for the papules on her upper chest. Skin scraping collected. We will send for KOH. We'll call the results once available. If rash not improving over the next 7-10 days then please call us back. Make sure to stop the hydrocortisone.  Hypertension-well-controlled today back on medication regimen. Refill sent to pharmacy.F/u in 3-4 mo/

## 2013-02-14 LAB — KOH PREP: RESULT - KOH: NONE SEEN

## 2013-02-28 ENCOUNTER — Other Ambulatory Visit (HOSPITAL_COMMUNITY): Payer: Self-pay | Admitting: Psychiatry

## 2013-03-01 ENCOUNTER — Ambulatory Visit (INDEPENDENT_AMBULATORY_CARE_PROVIDER_SITE_OTHER): Payer: Medicare Other | Admitting: Psychiatry

## 2013-03-01 ENCOUNTER — Encounter (HOSPITAL_COMMUNITY): Payer: Self-pay | Admitting: Psychiatry

## 2013-03-01 VITALS — BP 110/72 | Ht 66.5 in | Wt 211.0 lb

## 2013-03-01 DIAGNOSIS — F4310 Post-traumatic stress disorder, unspecified: Secondary | ICD-10-CM

## 2013-03-01 DIAGNOSIS — G4700 Insomnia, unspecified: Secondary | ICD-10-CM

## 2013-03-01 DIAGNOSIS — F331 Major depressive disorder, recurrent, moderate: Secondary | ICD-10-CM

## 2013-03-01 MED ORDER — TRAZODONE HCL 50 MG PO TABS: 100.0000 mg | ORAL_TABLET | Freq: Every day | ORAL | Status: DC
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
## 2013-04-05 ENCOUNTER — Ambulatory Visit (HOSPITAL_COMMUNITY): Payer: Self-pay | Admitting: Psychiatry

## 2013-04-11 ENCOUNTER — Ambulatory Visit: Payer: Self-pay | Admitting: Family Medicine

## 2013-04-12 ENCOUNTER — Other Ambulatory Visit: Payer: Self-pay | Admitting: Family Medicine

## 2013-05-01 ENCOUNTER — Ambulatory Visit (INDEPENDENT_AMBULATORY_CARE_PROVIDER_SITE_OTHER): Payer: Medicare Other | Admitting: Psychiatry

## 2013-05-01 ENCOUNTER — Encounter (HOSPITAL_COMMUNITY): Payer: Self-pay | Admitting: Psychiatry

## 2013-05-01 VITALS — BP 145/79 | HR 72 | Wt 210.0 lb

## 2013-05-01 DIAGNOSIS — G4700 Insomnia, unspecified: Secondary | ICD-10-CM

## 2013-05-01 DIAGNOSIS — F329 Major depressive disorder, single episode, unspecified: Secondary | ICD-10-CM

## 2013-05-01 DIAGNOSIS — F410 Panic disorder [episodic paroxysmal anxiety] without agoraphobia: Secondary | ICD-10-CM

## 2013-05-01 DIAGNOSIS — F4310 Post-traumatic stress disorder, unspecified: Secondary | ICD-10-CM

## 2013-05-01 MED ORDER — CLONAZEPAM 0.5 MG PO TABS: ORAL_TABLET | ORAL | Status: DC

## 2013-05-01 MED ORDER — TRAZODONE HCL 50 MG PO TABS: 100.0000 mg | ORAL_TABLET | Freq: Every day | ORAL | Status: DC

## 2013-05-01 MED ORDER — SERTRALINE HCL 100 MG PO TABS: 200.0000 mg | ORAL_TABLET | Freq: Every day | ORAL | Status: DC
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
## 2013-05-25 ENCOUNTER — Other Ambulatory Visit: Payer: Self-pay | Admitting: Physician Assistant

## 2013-05-25 ENCOUNTER — Telehealth: Payer: Self-pay | Admitting: Physician Assistant

## 2013-05-25 MED ORDER — TIZANIDINE HCL 4 MG PO TABS: 4.0000 mg | ORAL_TABLET | Freq: Four times a day (QID) | ORAL | Status: DC | PRN

## 2013-05-25 NOTE — Telephone Encounter (Signed)
Call pt: got letter from insurance that they will not be paying for soma that we could give zanaflex or meloxicam. Have you tried either one of these? Would you like from use to send either one of these in it's place.

## 2013-05-25 NOTE — Telephone Encounter (Signed)
Pt.notified

## 2013-05-25 NOTE — Telephone Encounter (Signed)
Sent zanaflex. Only take as needed for muscle spasms and migraines just like soma.

## 2013-05-25 NOTE — Telephone Encounter (Signed)
Pt notified & would rather have zanaflex over meloxicam due to ulcers.

## 2013-06-19 ENCOUNTER — Encounter (HOSPITAL_COMMUNITY): Payer: Self-pay | Admitting: Psychiatry

## 2013-06-19 ENCOUNTER — Other Ambulatory Visit: Payer: Self-pay | Admitting: Family Medicine

## 2013-06-19 ENCOUNTER — Ambulatory Visit (INDEPENDENT_AMBULATORY_CARE_PROVIDER_SITE_OTHER): Payer: Medicare Other | Admitting: Psychiatry

## 2013-06-19 VITALS — BP 117/65 | HR 66 | Wt 199.5 lb

## 2013-06-19 DIAGNOSIS — F4310 Post-traumatic stress disorder, unspecified: Secondary | ICD-10-CM

## 2013-06-19 DIAGNOSIS — F329 Major depressive disorder, single episode, unspecified: Secondary | ICD-10-CM

## 2013-06-19 DIAGNOSIS — F410 Panic disorder [episodic paroxysmal anxiety] without agoraphobia: Secondary | ICD-10-CM

## 2013-06-19 DIAGNOSIS — G4700 Insomnia, unspecified: Secondary | ICD-10-CM

## 2013-06-19 MED ORDER — SERTRALINE HCL 100 MG PO TABS: 200.0000 mg | ORAL_TABLET | Freq: Every day | ORAL | Status: DC

## 2013-06-19 MED ORDER — CLONAZEPAM 0.5 MG PO TABS: ORAL_TABLET | ORAL | Status: DC

## 2013-06-19 MED ORDER — TRAZODONE HCL 50 MG PO TABS: 100.0000 mg | ORAL_TABLET | Freq: Every day | ORAL | Status: DC
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
## 2013-07-18 ENCOUNTER — Encounter: Payer: Self-pay | Admitting: Family Medicine

## 2013-07-18 ENCOUNTER — Ambulatory Visit (INDEPENDENT_AMBULATORY_CARE_PROVIDER_SITE_OTHER): Payer: Medicare Other | Admitting: Family Medicine

## 2013-07-18 VITALS — BP 129/75 | HR 67 | Temp 98.0°F | Wt 198.0 lb

## 2013-07-18 DIAGNOSIS — R319 Hematuria, unspecified: Secondary | ICD-10-CM

## 2013-07-18 DIAGNOSIS — N390 Urinary tract infection, site not specified: Secondary | ICD-10-CM

## 2013-07-18 DIAGNOSIS — G43909 Migraine, unspecified, not intractable, without status migrainosus: Secondary | ICD-10-CM

## 2013-07-18 LAB — POCT URINALYSIS DIPSTICK
Bilirubin, UA: NEGATIVE
Glucose, UA: NEGATIVE
Ketones, UA: NEGATIVE
Leukocytes, UA: NEGATIVE
Nitrite, UA: NEGATIVE
Protein, UA: NEGATIVE
Spec Grav, UA: <=1.005
Urobilinogen, UA: 0.2
pH, UA: 6

## 2013-07-18 MED ORDER — CIPROFLOXACIN HCL 500 MG PO TABS: 500.0000 mg | ORAL_TABLET | Freq: Two times a day (BID) | ORAL | Status: AC

## 2013-07-18 MED ORDER — CARISOPRODOL 350 MG PO TABS: ORAL_TABLET | ORAL | Status: DC

## 2013-07-18 NOTE — Progress Notes (Signed)
   Subjective:    Patient ID: Dawn Bailey, female    DOB: 1934/07/09, 79 y.o.   MRN: [id number]  HPI 6 days of dysuria.  Has been drinking cranberry juice.  + urgency but being unable to empty.  Did take AZO.  Has been increasing her liquids.  No fever or back pain.  + nausea.   Migraine- her insurance won't cover soma.  She tried the tizanidine but say made her feel bad and caused her to urinate on herself. Would like to go back to soma but knows she will have to pay out of pocket for it since her medicare won't cover it. She does understand safetly issues around this med.  Review of Systems     Objective:   Physical Exam  Constitutional: She is oriented to person, place, and time. She appears well-developed and well-nourished.  HENT:  Head: Normocephalic and atraumatic.  Eyes: Conjunctivae and EOM are normal.  Cardiovascular: Normal rate.   Pulmonary/Chest: Effort normal.  Abdominal: Soft. There is tenderness.  Mild suprpubic tenderness   Musculoskeletal:  No CVA tenderness   Neurological: She is alert and oriented to person, place, and time. She displays abnormal reflex.  Skin: Skin is dry. No pallor.  Psychiatric: She has a normal mood and affect. Her behavior is normal.          Assessment & Plan:  UTI- UA only pos for blood.  Will tx for now and call with culture results. Tx with cipro.  Increase fluids.  Call if fever or GI sxs.    Migraine - will go back to soma. D/c Zanaflex.

## 2013-07-20 LAB — URINE CULTURE

## 2013-07-25 ENCOUNTER — Ambulatory Visit (INDEPENDENT_AMBULATORY_CARE_PROVIDER_SITE_OTHER): Payer: Medicare Other | Admitting: Family Medicine

## 2013-07-25 ENCOUNTER — Encounter: Payer: Self-pay | Admitting: Family Medicine

## 2013-07-25 VITALS — BP 126/72 | HR 65 | Temp 97.6°F

## 2013-07-25 DIAGNOSIS — R300 Dysuria: Secondary | ICD-10-CM

## 2013-07-25 LAB — POCT URINALYSIS DIPSTICK
Bilirubin, UA: NEGATIVE
Blood, UA: NEGATIVE
Glucose, UA: NEGATIVE
Ketones, UA: NEGATIVE
Leukocytes, UA: NEGATIVE
Nitrite, UA: NEGATIVE
Protein, UA: NEGATIVE
Spec Grav, UA: 1.01
Urobilinogen, UA: 0.2
pH, UA: 7

## 2013-07-25 NOTE — Addendum Note (Signed)
Addended by: Teddy Spike on: 07/25/2013 03:42 PM   Modules accepted: Orders

## 2013-07-25 NOTE — Progress Notes (Signed)
   Subjective:    Patient ID: Dawn Bailey, female    DOB: 15-Jul-1934, 79 y.o.   MRN: [id number]  HPI Has noticed a weaker stream.  She gets the urge to go but then there is a delay or notices  Trickles. Did feel a little better on the antibiotic but still with sxs. Does feel better today than yesterday no fevers chills or sweats. No back pain with it. No hematuria.  Review of Systems     Objective:   Physical Exam  Constitutional: She appears well-developed and well-nourished.  HENT:  Head: Normocephalic and atraumatic.  Skin: Skin is warm and dry.  Psychiatric: She has a normal mood and affect. Her behavior is normal.        Assessment & Plan:  Decreased urinary stream-initial urine culture was contaminated with several different microbes. She did go ahead and complete the antibiotic. Repeat urinalysis today is negative we will send for repeat culture. We'll call her with the results before the weekend. I did not put her on a new prescription today we'll just see how she feels. Make sure staying well hydrated. If repeat urine culture is negative then recommend referral to urology if she's still symptomatic. Consider differential diagnosis of urethral stricture.

## 2013-07-27 LAB — URINE CULTURE: Colony Count: 30000

## 2013-07-31 ENCOUNTER — Ambulatory Visit (INDEPENDENT_AMBULATORY_CARE_PROVIDER_SITE_OTHER): Payer: Medicare Other | Admitting: Psychiatry

## 2013-07-31 ENCOUNTER — Encounter (HOSPITAL_COMMUNITY): Payer: Self-pay | Admitting: Psychiatry

## 2013-07-31 VITALS — BP 93/57 | HR 72 | Wt 191.5 lb

## 2013-07-31 DIAGNOSIS — G4700 Insomnia, unspecified: Secondary | ICD-10-CM

## 2013-07-31 DIAGNOSIS — F4310 Post-traumatic stress disorder, unspecified: Secondary | ICD-10-CM

## 2013-07-31 DIAGNOSIS — F329 Major depressive disorder, single episode, unspecified: Secondary | ICD-10-CM

## 2013-07-31 DIAGNOSIS — F410 Panic disorder [episodic paroxysmal anxiety] without agoraphobia: Secondary | ICD-10-CM

## 2013-07-31 MED ORDER — SERTRALINE HCL 100 MG PO TABS: 200.0000 mg | ORAL_TABLET | Freq: Every day | ORAL | Status: DC

## 2013-07-31 MED ORDER — CLONAZEPAM 0.5 MG PO TABS: ORAL_TABLET | ORAL | Status: DC

## 2013-07-31 MED ORDER — TRAZODONE HCL 50 MG PO TABS: 100.0000 mg | ORAL_TABLET | Freq: Every day | ORAL | Status: DC
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
## 2013-08-20 ENCOUNTER — Other Ambulatory Visit: Payer: Self-pay | Admitting: Family Medicine

## 2013-09-20 ENCOUNTER — Other Ambulatory Visit: Payer: Self-pay | Admitting: Family Medicine

## 2013-10-08 IMAGING — CR DG ABDOMEN 2V
2 series · 2 of 2 positions shown · non-contrast
Comparison: 12/19/2008 lumbar spine plain film exam.

CLINICAL DATA: Lower abdominal pain.  Diarrhea.

ABDOMEN - 2 VIEW

[view not recorded (1 of 2)]
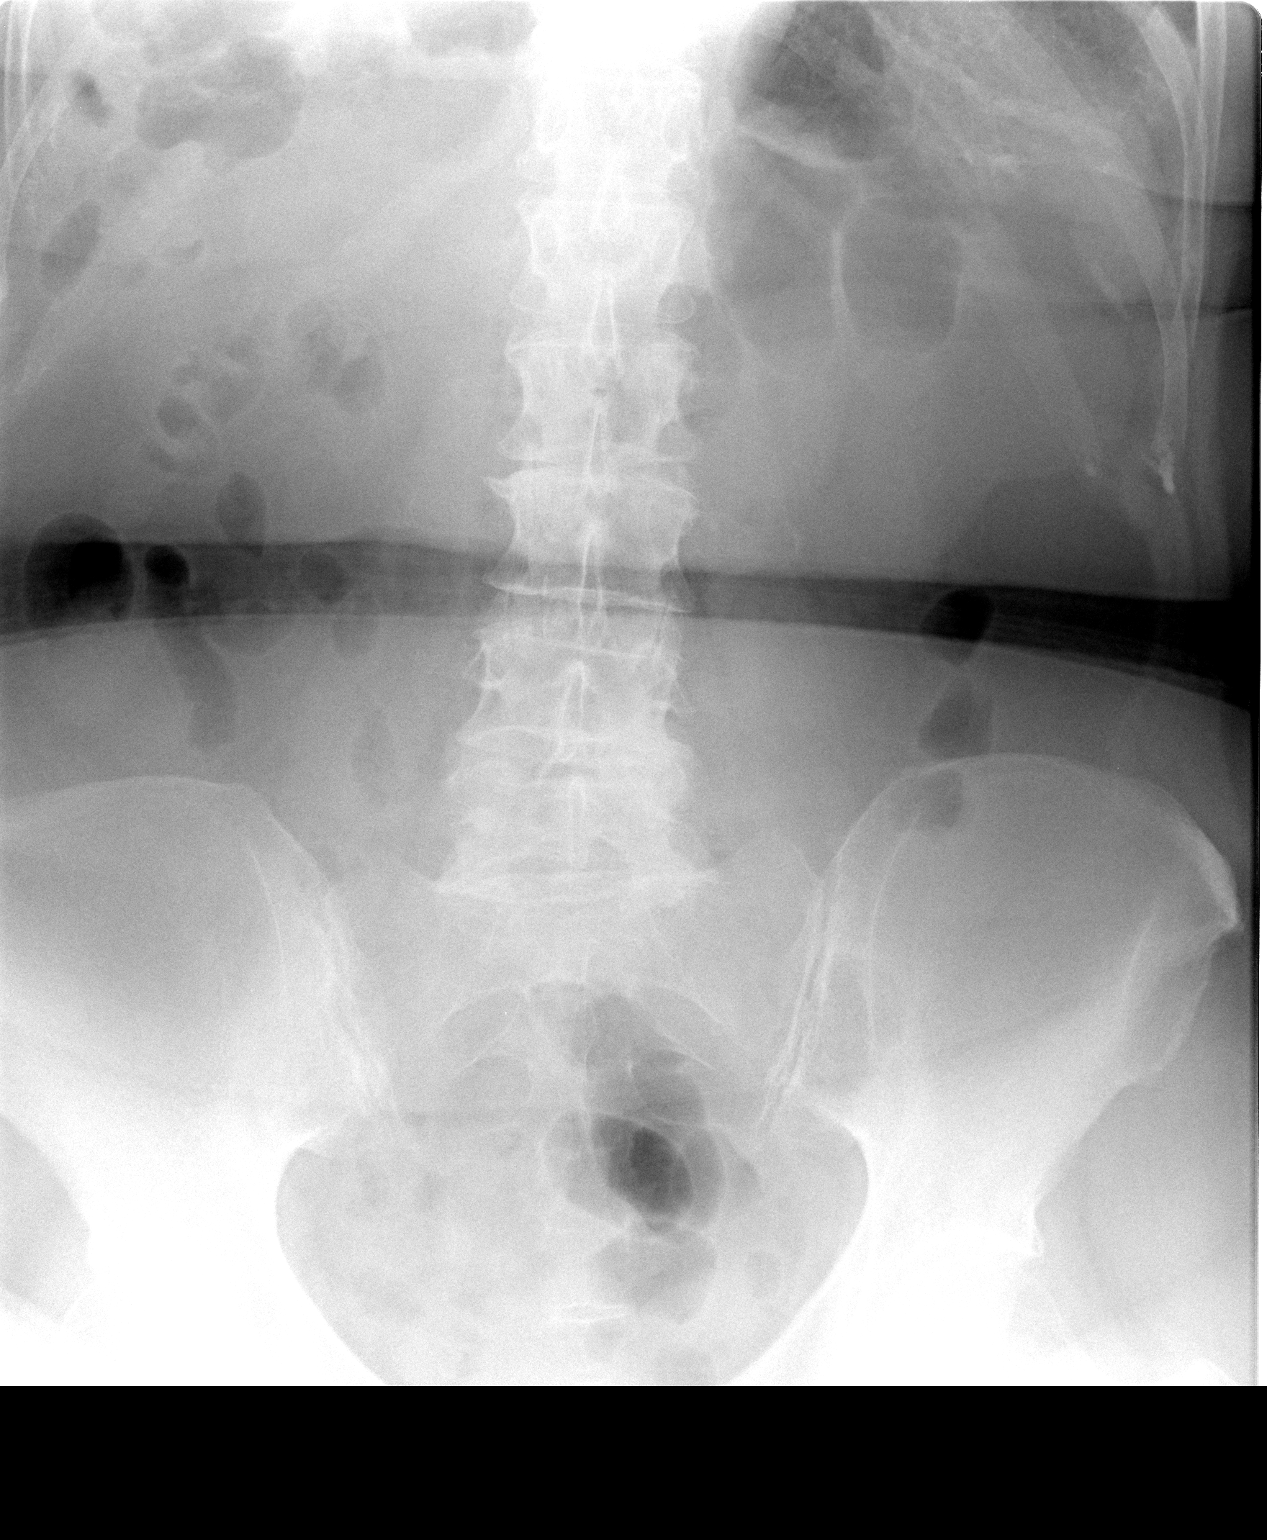

[view not recorded (2 of 2)]
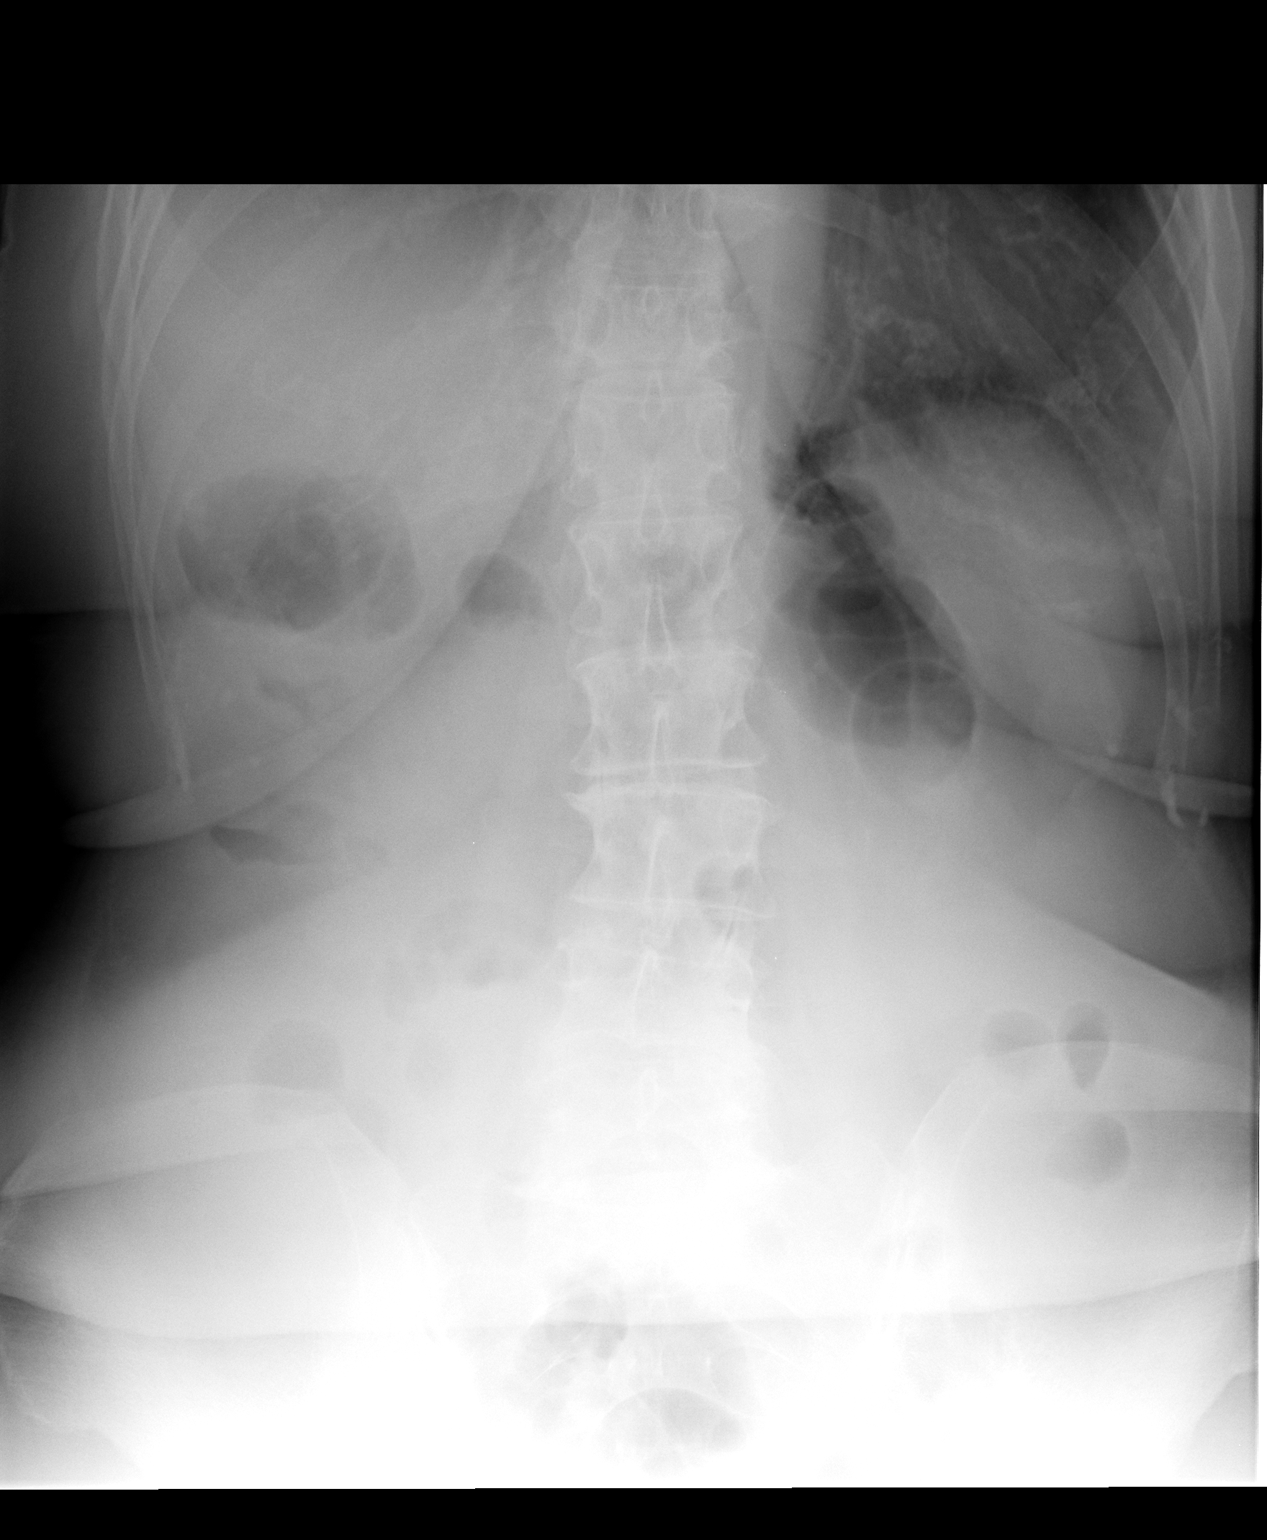

[2 of 2 positions shown; findings below may reference images not displayed]

FINDINGS: Nonspecific bowel gas pattern without plain film evidence
of bowel obstruction.  The upright view was not centered at the
level of the elevated right hemi diaphragm limiting evaluation for
detection of small amount of free intraperitoneal air.  No gross
free intraperitoneal air noted.

Degenerative changes lumbar spine.
IMPRESSION: No plain film evidence of bowel obstruction.  Please see above

## 2013-11-13 ENCOUNTER — Encounter: Payer: Self-pay | Admitting: Family Medicine

## 2013-11-13 ENCOUNTER — Ambulatory Visit (INDEPENDENT_AMBULATORY_CARE_PROVIDER_SITE_OTHER): Payer: Medicare Other | Admitting: Family Medicine

## 2013-11-13 VITALS — BP 121/61 | HR 65 | Ht 66.0 in | Wt 190.0 lb

## 2013-11-13 DIAGNOSIS — I10 Essential (primary) hypertension: Secondary | ICD-10-CM

## 2013-11-13 DIAGNOSIS — F410 Panic disorder [episodic paroxysmal anxiety] without agoraphobia: Secondary | ICD-10-CM

## 2013-11-13 DIAGNOSIS — Z23 Encounter for immunization: Secondary | ICD-10-CM

## 2013-11-13 DIAGNOSIS — G4700 Insomnia, unspecified: Secondary | ICD-10-CM

## 2013-11-13 DIAGNOSIS — M5431 Sciatica, right side: Secondary | ICD-10-CM

## 2013-11-13 DIAGNOSIS — M5430 Sciatica, unspecified side: Secondary | ICD-10-CM

## 2013-11-13 MED ORDER — CLONAZEPAM 0.5 MG PO TABS: ORAL_TABLET | ORAL | Status: DC

## 2013-11-13 NOTE — Progress Notes (Signed)
Subjective:    Patient ID: Dawn Bailey, female    DOB: 04-30-1935, 79 y.o.   MRN: [id number]  HPI Followup panic disorder-she's trying to get an appointment to see Dr. Dawna Part. Her former psychiatrist.  She left the practice and is still proximal and the local area. She wants and if I would be willing to bridge her medication in the interim. She has enough sertraline and trazodone until September will run out of clonazepam in about 2 weeks. She takes it twice a day on average.  Hypertension- Pt denies chest pain, SOB, dizziness, or heart palpitations.  Taking meds as directed w/o problems.  Denies medication side effects.    Insomnia-using trazodone for sleep. Feels like it's helping   Review of Systems  BP 121/61  Pulse 65  Ht 5\' 6"  (1.676 m)  Wt 190 lb (86.183 kg)  BMI 30.68 kg/m2    Allergies  Allergen Reactions  . Aripiprazole     REACTION: nightmares.  . Cephalexin     REACTION: vomiting  . Lipitor [Atorvastatin] Other (See Comments)    Muscle pain  . Simvastatin Other (See Comments)    Muscle aches/   . Skelaxin [Metaxalone] Other (See Comments)    Cough, SOB  . Sulfonamide Derivatives     Past Medical History  Diagnosis Date  . Depression   . Allergy   . IBS (irritable bowel syndrome)   . Hearing loss   . Lumbar sprain and strain   . Back sprain/strain, thoracic   . Nevus     atypical  . Gastric ulcer   . Panic attacks   . Osteopenia   . Migraines   . Hypertension   . Hyperlipidemia   . Obesity   . Anemia   . Diverticulosis   . Hemorrhoids     Past Surgical History  Procedure Laterality Date  . Cataract surgery  2004    bilateral    History   Social History  . Marital Status: Married    Spouse Name: N/A    Number of Children: N/A  . Years of Education: N/A   Occupational History  . Not on file.   Social History Main Topics  . Smoking status: Former Smoker    Quit date: 05/05/1995  . Smokeless tobacco: Never Used  .  Alcohol Use: 0.0 oz/week    .5 drink(s) per week     Comment: per week  . Drug Use: No     Comment: Caffiene: 1 cup of tea  . Sexual Activity: No     Comment: married, 2 children, LMP over 10 yrs ago, walks everyday, daily caffeine.   Other Topics Concern  . Not on file   Social History Narrative  . No narrative on file    Family History  Problem Relation Age of Onset  . Depression Mother   . Diabetes Father   . Hypertension Father   . Stroke Father   . Hypertension Brother     Outpatient Encounter Prescriptions as of 11/13/2013  Medication Sig  . carisoprodol (SOMA) 350 MG tablet take 1 tablet by mouth if needed once daily for muscle spasm and migraines  . clonazePAM (KLONOPIN) 0.5 MG tablet take 1 tablet by mouth two times a day if needed for anxiety  . dicyclomine (BENTYL) 10 MG capsule Take 10 mg by mouth as needed for spasms.  Marland Kitchen dicyclomine (BENTYL) 20 MG tablet take 1 tablet by mouth every 6 hours  . enalapril (  VASOTEC) 5 MG tablet Take 1 tablet (5 mg total) by mouth daily.  Marland Kitchen loperamide (IMODIUM A-D) 2 MG tablet Take 2 mg by mouth as needed.    . metoprolol tartrate (LOPRESSOR) 25 MG tablet Take 1 tablet (25 mg total) by mouth 2 (two) times daily.  . promethazine (PHENERGAN) 25 MG tablet take 1 tablet by mouth every 6 hours if needed for nausea  . sertraline (ZOLOFT) 100 MG tablet Take 2 tablets (200 mg total) by mouth daily.  . traZODone (DESYREL) 50 MG tablet Take 2 tablets (100 mg total) by mouth at bedtime. Generic  . [DISCONTINUED] clonazePAM (KLONOPIN) 0.5 MG tablet take 1 tablet by mouth two times a day if needed for anxiety          Objective:   Physical Exam  Constitutional: She is oriented to person, place, and time. She appears well-developed and well-nourished.  HENT:  Head: Normocephalic and atraumatic.  Cardiovascular: Normal rate, regular rhythm and normal heart sounds.   Pulmonary/Chest: Effort normal and breath sounds normal.  Neurological: She  is alert and oriented to person, place, and time.  Skin: Skin is warm and dry.  Psychiatric: She has a normal mood and affect. Her behavior is normal.          Assessment & Plan:  Panic disorder-well-controlled on current regimen. She can try to get back in with Dr. Dawna Part. I believe she is booked out until September or October so I will bridge her medications until then. I want him printed a prescription for clonazepam even though she's not due for 2 weeks. I gave her 3 months supply.  Hypertension- Well controlled.  Continue current regimen. Follow up in 6 months.  Insomnia-well-controlled with trazodone.  She says that her sciatica in her left leg is flaring. She's been working out in the garden some it has been bothering her. She's not really taking any medications for it. No difficulty with ambulation. Recommended a trial of at least Aleve or ibuprofen for the next few days. Is not helping and consider a prednisone burst she can call the office next week if she's not improving. Also recommend applying heat and doing gentle stretches of her lumbar spine.  Tdap and prevnar 13 given today.

## 2013-11-17 LAB — COMPLETE METABOLIC PANEL WITH GFR
ALT: 13 U/L (ref 0–35)
AST: 20 U/L (ref 0–37)
Albumin: 4.1 g/dL (ref 3.5–5.2)
Alkaline Phosphatase: 65 U/L (ref 39–117)
BUN: 8 mg/dL (ref 6–23)
CO2: 28 mEq/L (ref 19–32)
Calcium: 9 mg/dL (ref 8.4–10.5)
Chloride: 101 mEq/L (ref 96–112)
Creat: 0.7 mg/dL (ref 0.50–1.10)
GFR, Est African American: >89 mL/min
GFR, Est Non African American: 84 mL/min
Glucose, Bld: 89 mg/dL (ref 70–99)
Potassium: 4.5 mEq/L (ref 3.5–5.3)
Sodium: 138 mEq/L (ref 135–145)
Total Bilirubin: 0.3 mg/dL (ref 0.2–1.2)
Total Protein: 6.6 g/dL (ref 6.0–8.3)

## 2013-11-17 LAB — LIPID PANEL
Cholesterol: 297 mg/dL — ABNORMAL HIGH (ref 0–200)
HDL: 63 mg/dL (ref >39)
LDL Cholesterol: 201 mg/dL — ABNORMAL HIGH (ref 0–99)
Total CHOL/HDL Ratio: 4.7 Ratio
Triglycerides: 165 mg/dL — ABNORMAL HIGH (ref <150)
VLDL: 33 mg/dL (ref 0–40)

## 2013-12-08 ENCOUNTER — Telehealth: Payer: Self-pay

## 2013-12-08 NOTE — Telephone Encounter (Signed)
Left message for patient to call back and schedule a Medicare Wellness visit.

## 2014-01-01 ENCOUNTER — Ambulatory Visit (INDEPENDENT_AMBULATORY_CARE_PROVIDER_SITE_OTHER): Payer: Medicare Other | Admitting: Family Medicine

## 2014-01-01 ENCOUNTER — Encounter: Payer: Self-pay | Admitting: Family Medicine

## 2014-01-01 VITALS — BP 105/66 | HR 72 | Wt 190.0 lb

## 2014-01-01 DIAGNOSIS — Z23 Encounter for immunization: Secondary | ICD-10-CM

## 2014-01-01 DIAGNOSIS — Z0000 Encounter for general adult medical examination without abnormal findings: Secondary | ICD-10-CM

## 2014-01-01 NOTE — Patient Instructions (Signed)
Keep up a regular exercise program and make sure you are eating a healthy diet Try to eat 4 servings of dairy a day, or if you are lactose intolerant take a calcium with vitamin D daily.  Your vaccines are up to date.   

## 2014-01-01 NOTE — Progress Notes (Signed)
Subjective:    Dawn Bailey is a 79 y.o. female who presents for Medicare Annual/Subsequent preventive examination.  Preventive Screening-Counseling & Management  Tobacco History  Smoking status  . Former Smoker  . Quit date: 05/05/1995  Smokeless tobacco  . Never Used     Problems Prior to Visit 1.   Current Problems (verified) Patient Active Problem List   Diagnosis Date Noted  . Overweight 06/15/2011  . Major depressive disorder, recurrent episode, moderate 05/06/2011    Class: Chronic  . Tremor 01/16/2011  . Chronic diarrhea 02/19/2010  . UNSPECIFIED HEARING LOSS 07/19/2009  . LUMBAR SPRAIN AND STRAIN 11/28/2008  . RHINITIS, ALLERGIC NOS 09/23/2006  . HYPERCHOLESTEROLEMIA 02/09/2006  . Posttraumatic stress disorder 02/09/2006  . MIGRAINE, UNSPEC., W/O INTRACTABLE MIGRAINE 02/09/2006  . HYPERTENSION, BENIGN SYSTEMIC 02/09/2006  . OSTEOPENIA 02/09/2006    Medications Prior to Visit Current Outpatient Prescriptions on File Prior to Visit  Medication Sig Dispense Refill  . carisoprodol (SOMA) 350 MG tablet take 1 tablet by mouth if needed once daily for muscle spasm and migraines  60 tablet  1  . clonazePAM (KLONOPIN) 0.5 MG tablet take 1 tablet by mouth two times a day if needed for anxiety  60 tablet  2  . dicyclomine (BENTYL) 20 MG tablet take 1 tablet by mouth every 6 hours  120 tablet  1  . enalapril (VASOTEC) 5 MG tablet Take 1 tablet (5 mg total) by mouth daily.  90 tablet  3  . loperamide (IMODIUM A-D) 2 MG tablet Take 2 mg by mouth as needed.        . metoprolol tartrate (LOPRESSOR) 25 MG tablet Take 1 tablet (25 mg total) by mouth 2 (two) times daily.  180 tablet  3  . promethazine (PHENERGAN) 25 MG tablet take 1 tablet by mouth every 6 hours if needed for nausea  30 tablet  3  . sertraline (ZOLOFT) 100 MG tablet Take 2 tablets (200 mg total) by mouth daily.  180 tablet  1  . traZODone (DESYREL) 50 MG tablet Take 2 tablets (100 mg total) by mouth at  bedtime. Generic  180 tablet  1   No current facility-administered medications on file prior to visit.    Current Medications (verified) Current Outpatient Prescriptions  Medication Sig Dispense Refill  . carisoprodol (SOMA) 350 MG tablet take 1 tablet by mouth if needed once daily for muscle spasm and migraines  60 tablet  1  . clonazePAM (KLONOPIN) 0.5 MG tablet take 1 tablet by mouth two times a day if needed for anxiety  60 tablet  2  . dicyclomine (BENTYL) 20 MG tablet take 1 tablet by mouth every 6 hours  120 tablet  1  . enalapril (VASOTEC) 5 MG tablet Take 1 tablet (5 mg total) by mouth daily.  90 tablet  3  . loperamide (IMODIUM A-D) 2 MG tablet Take 2 mg by mouth as needed.        . metoprolol tartrate (LOPRESSOR) 25 MG tablet Take 1 tablet (25 mg total) by mouth 2 (two) times daily.  180 tablet  3  . promethazine (PHENERGAN) 25 MG tablet take 1 tablet by mouth every 6 hours if needed for nausea  30 tablet  3  . sertraline (ZOLOFT) 100 MG tablet Take 2 tablets (200 mg total) by mouth daily.  180 tablet  1  . traZODone (DESYREL) 50 MG tablet Take 2 tablets (100 mg total) by mouth at bedtime. Generic  180 tablet  1  . [place] 5-2.5-18.5 injection        No current facility-administered medications for this visit.     Allergies (verified) Aripiprazole; Cephalexin; Lipitor; Simvastatin; Skelaxin; and Sulfonamide derivatives   PAST HISTORY  Family History Family History  Problem Relation Age of Onset  . Depression Mother   . Diabetes Father   . Hypertension Father   . Stroke Father   . Hypertension Brother     Social History History  Substance Use Topics  . Smoking status: Former Smoker    Quit date: 05/05/1995  . Smokeless tobacco: Never Used  . Alcohol Use: 0.0 oz/week    .5 drink(s) per week     Comment: per week     Are there smokers in your home (other than you)? No  Risk Factors Current exercise habits: Home exercise routine includes walking, gardening.   Dietary issues discussed: None   Cardiac risk factors: advanced age (older than 38 for men, 39 for women), obesity (BMI >= 30 kg/m2) and sedentary lifestyle.  Depression Screen (Note: if answer to either of the following is "Yes", a more complete depression screening is indicated)   Over the past two weeks, have you felt down, depressed or hopeless? No  Over the past two weeks, have you felt little interest or pleasure in doing things? Yes  Have you lost interest or pleasure in daily life? No  Do you often feel hopeless? Yes  Do you cry easily over simple problems? No  Activities of Daily Living In your present state of health, do you have any difficulty performing the following activities?:  Driving? No Managing money?  No Feeding yourself? No Getting from bed to chair? No Climbing a flight of stairs? No Preparing food and eating?: No Bathing or showering? No Getting dressed: No Getting to the toilet? Yes Using the toilet:No Moving around from place to place: No In the past year have you fallen or had a near fall?:No   Are you sexually active?  No  Do you have more than one partner?  No  Hearing Difficulties: No Do you often ask people to speak up or repeat themselves? No Do you experience ringing or noises in your ears? No Do you have difficulty understanding soft or whispered voices? Yes   Do you feel that you have a problem with memory? No  Do you often misplace items? No  Do you feel safe at home?  Yes  Cognitive Testing  Alert? Yes  Normal Appearance?Yes  Oriented to person? Yes  Place? Yes   Time? Yes  Recall of three objects?  Yes  Can perform simple calculations? Yes  Displays appropriate judgment?Yes  Can read the correct time from a watch face?Yes   6CIT scor eof 2/28 ( normal)     Advanced Directives have been discussed with the patient? Yes  List the Names of Other Physician/Practitioners you currently use: 1.  Dr. Marjory Sneddon 2. Dr.  Legrand Como any recent Medical Services you may have received from other than [facility] providers in the past year (date may be approximate).  Immunization History  Administered Date(s) Administered  . Influenza Split 02/19/2012  . Influenza Whole 02/04/2006, 03/09/2007, 02/02/2008, 01/24/2010, 01/16/2011  . Influenza,inj,Quad PF,36+ Mos 01/09/2013  . Pneumococcal Conjugate-13 11/13/2013  . Pneumococcal Polysaccharide-23 01/03/2004  . Td 07/03/2003  . Tdap 11/13/2013    Screening Tests Health Maintenance  Topic Date Due  . Influenza Vaccine  12/02/2013  . Colonoscopy  05/05/2019  .  Tetanus/tdap  11/14/2023  . Pneumococcal Polysaccharide Vaccine Age 51 And Over  Completed  . Zostavax  Addressed    All answers were reviewed with the patient and necessary referrals were made:  METHENEY,CATHERINE, MD   01/01/2014   History reviewed: allergies, current medications, past family history, past medical history, past social history, past surgical history and problem list  Review of Systems A comprehensive review of systems was negative.    Objective:     Vision by Snellen chart: right eye:20/25, left eye:20/25  Body mass index is 30.68 kg/(m^2). BP 105/66  Pulse 72  Wt 190 lb (86.183 kg)  BP 105/66  Pulse 72  Wt 190 lb (86.183 kg) General appearance: alert, cooperative and appears stated age Head: Normocephalic, without obvious abnormality, atraumatic Eyes: conj clear, EOMI, PEERLA Ears: normal TM's and external ear canals both ears Nose: Nares normal. Septum midline. Mucosa normal. No drainage or sinus tenderness. Throat: lips, mucosa, and tongue normal; teeth and gums normal Neck: no adenopathy, no carotid bruit, no JVD, supple, symmetrical, trachea midline and thyroid not enlarged, symmetric, no tenderness/mass/nodules Back: symmetric, no curvature. ROM normal. No CVA tenderness. Lungs: clear to auscultation bilaterally Breasts: normal appearance, no masses or  tenderness Heart: regular rate and rhythm, S1, S2 normal, no murmur, click, rub or gallop Abdomen: soft, non-tender; bowel sounds normal; no masses,  no organomegaly Extremities: extremities normal, atraumatic, no cyanosis or edema Pulses: 2+ and symmetric Skin: Skin color, texture, turgor normal. No rashes or lesions Lymph nodes: Cervical, supraclavicular, and axillary nodes normal.     Assessment:     Annual Medicare Exam.    Plan:     During the course of the visit the patient was educated and counseled about appropriate screening and preventive services including:    Influenza vaccine  Diet review for nutrition referral? Yes ____  Not Indicated _x_   Patient Instructions (the written plan) was given to the patient.  Medicare Attestation I have personally reviewed: The patient's medical and social history Their use of alcohol, tobacco or illicit drugs Their current medications and supplements The patient's functional ability including ADLs,fall risks, home safety risks, cognitive, and hearing and visual impairment Diet and physical activities Evidence for depression or mood disorders  The patient's weight, height, BMI, and visual acuity have been recorded in the chart.  I have made referrals, counseling, and provided education to the patient based on review of the above and I have provided the patient with a written personalized care plan for preventive services.     METHENEY,CATHERINE, MD   01/01/2014

## 2014-01-02 ENCOUNTER — Other Ambulatory Visit: Payer: Self-pay | Admitting: Family Medicine

## 2014-02-04 ENCOUNTER — Other Ambulatory Visit: Payer: Self-pay | Admitting: Family Medicine

## 2014-02-09 ENCOUNTER — Other Ambulatory Visit: Payer: Self-pay | Admitting: Family Medicine

## 2014-02-25 ENCOUNTER — Other Ambulatory Visit: Payer: Self-pay | Admitting: Family Medicine

## 2014-03-05 ENCOUNTER — Other Ambulatory Visit: Payer: Self-pay | Admitting: Family Medicine

## 2014-05-07 ENCOUNTER — Other Ambulatory Visit: Payer: Self-pay | Admitting: Family Medicine

## 2014-05-08 ENCOUNTER — Ambulatory Visit (INDEPENDENT_AMBULATORY_CARE_PROVIDER_SITE_OTHER): Payer: Medicare Other | Admitting: Family Medicine

## 2014-05-08 ENCOUNTER — Encounter: Payer: Self-pay | Admitting: Family Medicine

## 2014-05-08 VITALS — BP 103/64 | HR 79 | Ht 66.0 in | Wt 194.0 lb

## 2014-05-08 DIAGNOSIS — G43009 Migraine without aura, not intractable, without status migrainosus: Secondary | ICD-10-CM

## 2014-05-08 DIAGNOSIS — G4700 Insomnia, unspecified: Secondary | ICD-10-CM | POA: Diagnosis not present

## 2014-05-08 DIAGNOSIS — I10 Essential (primary) hypertension: Secondary | ICD-10-CM

## 2014-05-08 DIAGNOSIS — F4310 Post-traumatic stress disorder, unspecified: Secondary | ICD-10-CM | POA: Diagnosis not present

## 2014-05-08 MED ORDER — CLONAZEPAM 0.5 MG PO TABS: ORAL_TABLET | ORAL | Status: DC

## 2014-05-08 NOTE — Progress Notes (Signed)
   Subjective:    Patient ID: Dawn Bailey, female    DOB: 1934/06/07, 80 y.o.   MRN: [id number]  HPI Hypertension- Pt denies chest pain, SOB, dizziness, or heart palpitations.  Taking meds as directed w/o problems.  Denies medication side effects.    Migraine HA -  Will continue the metoprolol .  STable. Feels like at her baseline.  Uses SOMA prn and that helps her HA.   PTSD - wants a refill on her clonazepam. She will be much takes it twice a day everyday. She's also on sertraline on her milligrams daily and uses trazodone at bedtime. She's been on this regimen for quite some time and feels like she is doing well on it. Her biggest stressor is taking care of her husband who has Alzheimer's.  Insomnia - Doing well on trazodone 100mg  at bedtime That is working well for her.    Review of Systems     Objective:   Physical Exam  Constitutional: She is oriented to person, place, and time. She appears well-developed and well-nourished.  HENT:  Head: Normocephalic and atraumatic.  Cardiovascular: Normal rate, regular rhythm and normal heart sounds.   Pulmonary/Chest: Effort normal and breath sounds normal.  Neurological: She is alert and oriented to person, place, and time.  Skin: Skin is warm and dry.  Psychiatric: She has a normal mood and affect. Her behavior is normal.          Assessment & Plan:  HTN- BP has been a little low for the last several months.  Will stop the enalapril.  Continue the metoprolol  PTSD- discussed the need to wean her clonazepam.  Continue sertraline 100mg .  GAD-7 score of 5 today.  Rates sxs as somewhat difficult.   We discussed the importance of starting to wean down the clonazepam. We discussed increased risk of falls and dementia with prolonged use of this medication. Encouraged her start with decreasing one of her doses by half of a tab and she can always sick the extra half if needed on more stressful days. I really want to see her decreasing her use  and switching more to when necessary use. She does have some support in helping take care of her husband. Unfortunately he does get some sundowning and this makes it difficult for her in the evenings.  Insomnia - doing well on trazodone , 100 mg at bedtime.  Continue current dose.    migraine headaches- stable. No increase in frequency. Continue metoprolol for now. If blood pressures remain low then we may need to discontinue this as well. She uses her soma as needed for rescue. This is been effective for her for years and says she actually pays out-of-pocket forte since her insurance will no longer cover it.

## 2014-08-07 ENCOUNTER — Ambulatory Visit: Payer: Self-pay | Admitting: Family Medicine

## 2014-08-08 ENCOUNTER — Encounter: Payer: Self-pay | Admitting: Family Medicine

## 2014-08-08 ENCOUNTER — Ambulatory Visit (INDEPENDENT_AMBULATORY_CARE_PROVIDER_SITE_OTHER): Payer: Medicare Other | Admitting: Family Medicine

## 2014-08-08 VITALS — BP 124/74 | HR 67 | Ht 66.0 in | Wt 192.0 lb

## 2014-08-08 DIAGNOSIS — G4700 Insomnia, unspecified: Secondary | ICD-10-CM

## 2014-08-08 DIAGNOSIS — F4310 Post-traumatic stress disorder, unspecified: Secondary | ICD-10-CM

## 2014-08-08 DIAGNOSIS — I10 Essential (primary) hypertension: Secondary | ICD-10-CM

## 2014-08-08 MED ORDER — CLONAZEPAM 0.5 MG PO TABS: 0.5000 mg | ORAL_TABLET | Freq: Every day | ORAL | Status: DC

## 2014-08-08 NOTE — Progress Notes (Signed)
   Subjective:    Patient ID: Dawn Bailey, female    DOB: 1934/09/26, 80 y.o.   MRN: [id number]  HPI Hypertension- Pt denies chest pain, SOB, dizziness, or heart palpitations.  Taking meds as directed w/o problems.  Denies medication side effects.    PTSD- She is doing much better. She is now on Off the AM clonazapam. She plans to work on cutting that in half in the evening.  Notices her balance is much better now on dec dose of the clonazepam.   Insomnia-Says her nightmares have started back again. Usually happen close to the morning.  She is going to therapy every 3-4 months.   Review of Systems     Objective:   Physical Exam  Constitutional: She is oriented to person, place, and time. She appears well-developed and well-nourished.  HENT:  Head: Normocephalic and atraumatic.  Cardiovascular: Normal rate, regular rhythm and normal heart sounds.   Pulmonary/Chest: Effort normal and breath sounds normal.  Neurological: She is alert and oriented to person, place, and time.  Skin: Skin is warm and dry.  Psychiatric: She has a normal mood and affect. Her behavior is normal.          Assessment & Plan:  HTN -  Well-controlled. Continue current regimen. Next  PTSD-she has been a fantastic job weaning down on her clonazepam. Encouraged her to continue to just chip away at the evening dose. She mostly uses it in the evenings especially of her husband is sundowning. He has dementia. Continue therapy appointments.  Insomnia- sleep is fair , she would rate it is good quality but does select the trazodone is helping. Certainly we can adjust her dose if she would like. For now she was to continue her current regimen. Follow back up in 3-4 months.

## 2014-08-30 ENCOUNTER — Encounter: Payer: Self-pay | Admitting: Family Medicine

## 2014-08-30 ENCOUNTER — Ambulatory Visit (INDEPENDENT_AMBULATORY_CARE_PROVIDER_SITE_OTHER): Payer: Medicare Other | Admitting: Family Medicine

## 2014-08-30 VITALS — BP 124/66 | HR 72 | Temp 98.1°F | Ht 66.5 in | Wt 191.0 lb

## 2014-08-30 DIAGNOSIS — M545 Low back pain, unspecified: Secondary | ICD-10-CM

## 2014-08-30 MED ORDER — TIZANIDINE HCL 4 MG PO TABS: 4.0000 mg | ORAL_TABLET | Freq: Three times a day (TID) | ORAL | Status: DC | PRN

## 2014-08-30 NOTE — Patient Instructions (Signed)
Low Back Sprain with Rehab  A sprain is an injury in which a ligament is torn. The ligaments of the lower back are vulnerable to sprains. However, they are strong and require great force to be injured. These ligaments are important for stabilizing the spinal column. Sprains are classified into three categories. Grade 1 sprains cause pain, but the tendon is not lengthened. Grade 2 sprains include a lengthened ligament, due to the ligament being stretched or partially ruptured. With grade 2 sprains there is still function, although the function may be decreased. Grade 3 sprains involve a complete tear of the tendon or muscle, and function is usually impaired. SYMPTOMS   Severe pain in the lower back.  Sometimes, a feeling of a "pop," "snap," or tear, at the time of injury.  Tenderness and sometimes swelling at the injury site.  Uncommonly, bruising (contusion) within 48 hours of injury.  Muscle spasms in the back. CAUSES  Low back sprains occur when a force is placed on the ligaments that is greater than they can handle. Common causes of injury include:  Performing a stressful act while off-balance.  Repetitive stressful activities that involve movement of the lower back.  Direct hit (trauma) to the lower back. RISK INCREASES WITH:  Contact sports (football, wrestling).  Collisions (major skiing accidents).  Sports that require throwing or lifting (baseball, weightlifting).  Sports involving twisting of the spine (gymnastics, diving, tennis, golf).  Poor strength and flexibility.  Inadequate protection.  Previous back injury or surgery (especially fusion). PREVENTION  Wear properly fitted and padded protective equipment.  Warm up and stretch properly before activity.  Allow for adequate recovery between workouts.  Maintain physical fitness:  Strength, flexibility, and endurance.  Cardiovascular fitness.  Maintain a healthy body weight. PROGNOSIS  If treated  properly, low back sprains usually heal with non-surgical treatment. The length of time for healing depends on the severity of the injury.  RELATED COMPLICATIONS   Recurring symptoms, resulting in a chronic problem.  Chronic inflammation and pain in the low back.  Delayed healing or resolution of symptoms, especially if activity is resumed too soon.  Prolonged impairment.  Unstable or arthritic joints of the low back. TREATMENT  Treatment first involves the use of ice and medicine, to reduce pain and inflammation. The use of strengthening and stretching exercises may help reduce pain with activity. These exercises may be performed at home or with a therapist. Severe injuries may require referral to a therapist for further evaluation and treatment, such as ultrasound. Your caregiver may advise that you wear a back brace or corset, to help reduce pain and discomfort. Often, prolonged bed rest results in greater harm then benefit. Corticosteroid injections may be recommended. However, these should be reserved for the most serious cases. It is important to avoid using your back when lifting objects. At night, sleep on your back on a firm mattress, with a pillow placed under your knees. If non-surgical treatment is unsuccessful, surgery may be needed.  MEDICATION   If pain medicine is needed, nonsteroidal anti-inflammatory medicines (aspirin and ibuprofen), or other minor pain relievers (acetaminophen), are often advised.  Do not take pain medicine for 7 days before surgery.  Prescription pain relievers may be given, if your caregiver thinks they are needed. Use only as directed and only as much as you need.  Ointments applied to the skin may be helpful.  Corticosteroid injections may be given by your caregiver. These injections should be reserved for the most serious cases,   because they may only be given a certain number of times. HEAT AND COLD  Cold treatment (icing) should be applied for 10  to 15 minutes every 2 to 3 hours for inflammation and pain, and immediately after activity that aggravates your symptoms. Use ice packs or an ice massage.  Heat treatment may be used before performing stretching and strengthening activities prescribed by your caregiver, physical therapist, or athletic trainer. Use a heat pack or a warm water soak. SEEK MEDICAL CARE IF:   Symptoms get worse or do not improve in 2 to 4 weeks, despite treatment.  You develop numbness or weakness in either leg.  You lose bowel or bladder function.  Any of the following occur after surgery: fever, increased pain, swelling, redness, drainage of fluids, or bleeding in the affected area.  New, unexplained symptoms develop. (Drugs used in treatment may produce side effects.) EXERCISES  RANGE OF MOTION (ROM) AND STRETCHING EXERCISES - Low Back Sprain Most people with lower back pain will find that their symptoms get worse with excessive bending forward (flexion) or arching at the lower back (extension). The exercises that will help resolve your symptoms will focus on the opposite motion.  Your physician, physical therapist or athletic trainer will help you determine which exercises will be most helpful to resolve your lower back pain. Do not complete any exercises without first consulting with your caregiver. Discontinue any exercises which make your symptoms worse, until you speak to your caregiver. If you have pain, numbness or tingling which travels down into your buttocks, leg or foot, the goal of the therapy is for these symptoms to move closer to your back and eventually resolve. Sometimes, these leg symptoms will get better, but your lower back pain may worsen. This is often an indication of progress in your rehabilitation. Be very alert to any changes in your symptoms and the activities in which you participated in the 24 hours prior to the change. Sharing this information with your caregiver will allow him or her to  most efficiently treat your condition. These exercises may help you when beginning to rehabilitate your injury. Your symptoms may resolve with or without further involvement from your physician, physical therapist or athletic trainer. While completing these exercises, remember:   Restoring tissue flexibility helps normal motion to return to the joints. This allows healthier, less painful movement and activity.  An effective stretch should be held for at least 30 seconds.  A stretch should never be painful. You should only feel a gentle lengthening or release in the stretched tissue. FLEXION RANGE OF MOTION AND STRETCHING EXERCISES: STRETCH - Flexion, Single Knee to Chest   Lie on a firm bed or floor with both legs extended in front of you.  Keeping one leg in contact with the floor, bring your opposite knee to your chest. Hold your leg in place by either grabbing behind your thigh or at your knee.  Pull until you feel a gentle stretch in your low back. Hold __________ seconds.  Slowly release your grasp and repeat the exercise with the opposite side. Repeat __________ times. Complete this exercise __________ times per day.  STRETCH - Flexion, Double Knee to Chest  Lie on a firm bed or floor with both legs extended in front of you.  Keeping one leg in contact with the floor, bring your opposite knee to your chest.  Tense your stomach muscles to support your back and then lift your other knee to your chest. Hold your legs   in place by either grabbing behind your thighs or at your knees.  Pull both knees toward your chest until you feel a gentle stretch in your low back. Hold __________ seconds.  Tense your stomach muscles and slowly return one leg at a time to the floor. Repeat __________ times. Complete this exercise __________ times per day.  STRETCH - Low Trunk Rotation  Lie on a firm bed or floor. Keeping your legs in front of you, bend your knees so they are both pointed toward the  ceiling and your feet are flat on the floor.  Extend your arms out to the side. This will stabilize your upper body by keeping your shoulders in contact with the floor.  Gently and slowly drop both knees together to one side until you feel a gentle stretch in your low back. Hold for __________ seconds.  Tense your stomach muscles to support your lower back as you bring your knees back to the starting position. Repeat the exercise to the other side. Repeat __________ times. Complete this exercise __________ times per day  EXTENSION RANGE OF MOTION AND FLEXIBILITY EXERCISES: STRETCH - Extension, Prone on Elbows   Lie on your stomach on the floor, a bed will be too soft. Place your palms about shoulder width apart and at the height of your head.  Place your elbows under your shoulders. If this is too painful, stack pillows under your chest.  Allow your body to relax so that your hips drop lower and make contact more completely with the floor.  Hold this position for __________ seconds.  Slowly return to lying flat on the floor. Repeat __________ times. Complete this exercise __________ times per day.  RANGE OF MOTION - Extension, Prone Press Ups  Lie on your stomach on the floor, a bed will be too soft. Place your palms about shoulder width apart and at the height of your head.  Keeping your back as relaxed as possible, slowly straighten your elbows while keeping your hips on the floor. You may adjust the placement of your hands to maximize your comfort. As you gain motion, your hands will come more underneath your shoulders.  Hold this position __________ seconds.  Slowly return to lying flat on the floor. Repeat __________ times. Complete this exercise __________ times per day.  RANGE OF MOTION- Quadruped, Neutral Spine   Assume a hands and knees position on a firm surface. Keep your hands under your shoulders and your knees under your hips. You may place padding under your knees for  comfort.  Drop your head and point your tailbone toward the ground below you. This will round out your lower back like an angry cat. Hold this position for __________ seconds.  Slowly lift your head and release your tail bone so that your back sags into a large arch, like an old horse.  Hold this position for __________ seconds.  Repeat this until you feel limber in your low back.  Now, find your "sweet spot." This will be the most comfortable position somewhere between the two previous positions. This is your neutral spine. Once you have found this position, tense your stomach muscles to support your low back.  Hold this position for __________ seconds. Repeat __________ times. Complete this exercise __________ times per day.  STRENGTHENING EXERCISES - Low Back Sprain These exercises may help you when beginning to rehabilitate your injury. These exercises should be done near your "sweet spot." This is the neutral, low-back arch, somewhere between fully rounded   and fully arched, that is your least painful position. When performed in this safe range of motion, these exercises can be used for people who have either a flexion or extension based injury. These exercises may resolve your symptoms with or without further involvement from your physician, physical therapist or athletic trainer. While completing these exercises, remember:   Muscles can gain both the endurance and the strength needed for everyday activities through controlled exercises.  Complete these exercises as instructed by your physician, physical therapist or athletic trainer. Increase the resistance and repetitions only as guided.  You may experience muscle soreness or fatigue, but the pain or discomfort you are trying to eliminate should never worsen during these exercises. If this pain does worsen, stop and make certain you are following the directions exactly. If the pain is still present after adjustments, discontinue the  exercise until you can discuss the trouble with your caregiver. STRENGTHENING - Deep Abdominals, Pelvic Tilt   Lie on a firm bed or floor. Keeping your legs in front of you, bend your knees so they are both pointed toward the ceiling and your feet are flat on the floor.  Tense your lower abdominal muscles to press your low back into the floor. This motion will rotate your pelvis so that your tail bone is scooping upwards rather than pointing at your feet or into the floor. With a gentle tension and even breathing, hold this position for __________ seconds. Repeat __________ times. Complete this exercise __________ times per day.  STRENGTHENING - Abdominals, Crunches   Lie on a firm bed or floor. Keeping your legs in front of you, bend your knees so they are both pointed toward the ceiling and your feet are flat on the floor. Cross your arms over your chest.  Slightly tip your chin down without bending your neck.  Tense your abdominals and slowly lift your trunk high enough to just clear your shoulder blades. Lifting higher can put excessive stress on the lower back and does not further strengthen your abdominal muscles.  Control your return to the starting position. Repeat __________ times. Complete this exercise __________ times per day.  STRENGTHENING - Quadruped, Opposite UE/LE Lift   Assume a hands and knees position on a firm surface. Keep your hands under your shoulders and your knees under your hips. You may place padding under your knees for comfort.  Find your neutral spine and gently tense your abdominal muscles so that you can maintain this position. Your shoulders and hips should form a rectangle that is parallel with the floor and is not twisted.  Keeping your trunk steady, lift your right hand no higher than your shoulder and then your left leg no higher than your hip. Make sure you are not holding your breath. Hold this position for __________ seconds.  Continuing to keep  your abdominal muscles tense and your back steady, slowly return to your starting position. Repeat with the opposite arm and leg. Repeat __________ times. Complete this exercise __________ times per day.  STRENGTHENING - Abdominals and Quadriceps, Straight Leg Raise   Lie on a firm bed or floor with both legs extended in front of you.  Keeping one leg in contact with the floor, bend the other knee so that your foot can rest flat on the floor.  Find your neutral spine, and tense your abdominal muscles to maintain your spinal position throughout the exercise.  Slowly lift your straight leg off the floor about 6 inches for a count   of 15, making sure to not hold your breath.  Still keeping your neutral spine, slowly lower your leg all the way to the floor. Repeat this exercise with each leg __________ times. Complete this exercise __________ times per day. POSTURE AND BODY MECHANICS CONSIDERATIONS - Low Back Sprain Keeping correct posture when sitting, standing or completing your activities will reduce the stress put on different body tissues, allowing injured tissues a chance to heal and limiting painful experiences. The following are general guidelines for improved posture. Your physician or physical therapist will provide you with any instructions specific to your needs. While reading these guidelines, remember:  The exercises prescribed by your provider will help you have the flexibility and strength to maintain correct postures.  The correct posture provides the best environment for your joints to work. All of your joints have less wear and tear when properly supported by a spine with good posture. This means you will experience a healthier, less painful body.  Correct posture must be practiced with all of your activities, especially prolonged sitting and standing. Correct posture is as important when doing repetitive low-stress activities (typing) as it is when doing a single heavy-load  activity (lifting). RESTING POSITIONS Consider which positions are most painful for you when choosing a resting position. If you have pain with flexion-based activities (sitting, bending, stooping, squatting), choose a position that allows you to rest in a less flexed posture. You would want to avoid curling into a fetal position on your side. If your pain worsens with extension-based activities (prolonged standing, working overhead), avoid resting in an extended position such as sleeping on your stomach. Most people will find more comfort when they rest with their spine in a more neutral position, neither too rounded nor too arched. Lying on a non-sagging bed on your side with a pillow between your knees, or on your back with a pillow under your knees will often provide some relief. Keep in mind, being in any one position for a prolonged period of time, no matter how correct your posture, can still lead to stiffness. PROPER SITTING POSTURE In order to minimize stress and discomfort on your spine, you must sit with correct posture. Sitting with good posture should be effortless for a healthy body. Returning to good posture is a gradual process. Many people can work toward this most comfortably by using various supports until they have the flexibility and strength to maintain this posture on their own. When sitting with proper posture, your ears will fall over your shoulders and your shoulders will fall over your hips. You should use the back of the chair to support your upper back. Your lower back will be in a neutral position, just slightly arched. You may place a small pillow or folded towel at the base of your lower back for  support.  When working at a desk, create an environment that supports good, upright posture. Without extra support, muscles tire, which leads to excessive strain on joints and other tissues. Keep these recommendations in mind: CHAIR:  A chair should be able to slide under your desk  when your back makes contact with the back of the chair. This allows you to work closely.  The chair's height should allow your eyes to be level with the upper part of your monitor and your hands to be slightly lower than your elbows. BODY POSITION  Your feet should make contact with the floor. If this is not possible, use a foot rest.  Keep your   ears over your shoulders. This will reduce stress on your neck and low back. INCORRECT SITTING POSTURES  If you are feeling tired and unable to assume a healthy sitting posture, do not slouch or slump. This puts excessive strain on your back tissues, causing more damage and pain. Healthier options include:  Using more support, like a lumbar pillow.  Switching tasks to something that requires you to be upright or walking.  Talking a brief walk.  Lying down to rest in a neutral-spine position. PROLONGED STANDING WHILE SLIGHTLY LEANING FORWARD  When completing a task that requires you to lean forward while standing in one place for a long time, place either foot up on a stationary 2-4 inch high object to help maintain the best posture. When both feet are on the ground, the lower back tends to lose its slight inward curve. If this curve flattens (or becomes too large), then the back and your other joints will experience too much stress, tire more quickly, and can cause pain. CORRECT STANDING POSTURES Proper standing posture should be assumed with all daily activities, even if they only take a few moments, like when brushing your teeth. As in sitting, your ears should fall over your shoulders and your shoulders should fall over your hips. You should keep a slight tension in your abdominal muscles to brace your spine. Your tailbone should point down to the ground, not behind your body, resulting in an over-extended swayback posture.  INCORRECT STANDING POSTURES  Common incorrect standing postures include a forward head, locked knees and/or an excessive  swayback. WALKING Walk with an upright posture. Your ears, shoulders and hips should all line-up. PROLONGED ACTIVITY IN A FLEXED POSITION When completing a task that requires you to bend forward at your waist or lean over a low surface, try to find a way to stabilize 3 out of 4 of your limbs. You can place a hand or elbow on your thigh or rest a knee on the surface you are reaching across. This will provide you more stability, so that your muscles do not tire as quickly. By keeping your knees relaxed, or slightly bent, you will also reduce stress across your lower back. CORRECT LIFTING TECHNIQUES DO :  Assume a wide stance. This will provide you more stability and the opportunity to get as close as possible to the object which you are lifting.  Tense your abdominals to brace your spine. Bend at the knees and hips. Keeping your back locked in a neutral-spine position, lift using your leg muscles. Lift with your legs, keeping your back straight.  Test the weight of unknown objects before attempting to lift them.  Try to keep your elbows locked down at your sides in order get the best strength from your shoulders when carrying an object.  Always ask for help when lifting heavy or awkward objects. INCORRECT LIFTING TECHNIQUES DO NOT:   Lock your knees when lifting, even if it is a small object.  Bend and twist. Pivot at your feet or move your feet when needing to change directions.  Assume that you can safely pick up even a paperclip without proper posture. Document Released: 04/20/2005 Document Revised: 07/13/2011 Document Reviewed: 08/02/2008 [facility] Patient Information 2015 [place], [place]. This information is not intended to replace advice given to you by your health care provider. Make sure you discuss any questions you have with your health care provider. Low Back Sprain with Rehab  A sprain is an injury in which a ligament is  torn. The ligaments of the lower back are vulnerable to  sprains. However, they are strong and require great force to be injured. These ligaments are important for stabilizing the spinal column. Sprains are classified into three categories. Grade 1 sprains cause pain, but the tendon is not lengthened. Grade 2 sprains include a lengthened ligament, due to the ligament being stretched or partially ruptured. With grade 2 sprains there is still function, although the function may be decreased. Grade 3 sprains involve a complete tear of the tendon or muscle, and function is usually impaired. SYMPTOMS   Severe pain in the lower back.  Sometimes, a feeling of a "pop," "snap," or tear, at the time of injury.  Tenderness and sometimes swelling at the injury site.  Uncommonly, bruising (contusion) within 48 hours of injury.  Muscle spasms in the back. CAUSES  Low back sprains occur when a force is placed on the ligaments that is greater than they can handle. Common causes of injury include:  Performing a stressful act while off-balance.  Repetitive stressful activities that involve movement of the lower back.  Direct hit (trauma) to the lower back. RISK INCREASES WITH:  Contact sports (football, wrestling).  Collisions (major skiing accidents).  Sports that require throwing or lifting (baseball, weightlifting).  Sports involving twisting of the spine (gymnastics, diving, tennis, golf).  Poor strength and flexibility.  Inadequate protection.  Previous back injury or surgery (especially fusion). PREVENTION  Wear properly fitted and padded protective equipment.  Warm up and stretch properly before activity.  Allow for adequate recovery between workouts.  Maintain physical fitness:  Strength, flexibility, and endurance.  Cardiovascular fitness.  Maintain a healthy body weight. PROGNOSIS  If treated properly, low back sprains usually heal with non-surgical treatment. The length of time for healing depends on the severity of the injury.    RELATED COMPLICATIONS   Recurring symptoms, resulting in a chronic problem.  Chronic inflammation and pain in the low back.  Delayed healing or resolution of symptoms, especially if activity is resumed too soon.  Prolonged impairment.  Unstable or arthritic joints of the low back. TREATMENT  Treatment first involves the use of ice and medicine, to reduce pain and inflammation. The use of strengthening and stretching exercises may help reduce pain with activity. These exercises may be performed at home or with a therapist. Severe injuries may require referral to a therapist for further evaluation and treatment, such as ultrasound. Your caregiver may advise that you wear a back brace or corset, to help reduce pain and discomfort. Often, prolonged bed rest results in greater harm then benefit. Corticosteroid injections may be recommended. However, these should be reserved for the most serious cases. It is important to avoid using your back when lifting objects. At night, sleep on your back on a firm mattress, with a pillow placed under your knees. If non-surgical treatment is unsuccessful, surgery may be needed.  MEDICATION   If pain medicine is needed, nonsteroidal anti-inflammatory medicines (aspirin and ibuprofen), or other minor pain relievers (acetaminophen), are often advised.  Do not take pain medicine for 7 days before surgery.  Prescription pain relievers may be given, if your caregiver thinks they are needed. Use only as directed and only as much as you need.  Ointments applied to the skin may be helpful.  Corticosteroid injections may be given by your caregiver. These injections should be reserved for the most serious cases, because they may only be given a certain number of times. HEAT AND COLD  Cold treatment (icing) should be applied for 10 to 15 minutes every 2 to 3 hours for inflammation and pain, and immediately after activity that aggravates your symptoms. Use ice packs or  an ice massage.  Heat treatment may be used before performing stretching and strengthening activities prescribed by your caregiver, physical therapist, or athletic trainer. Use a heat pack or a warm water soak. SEEK MEDICAL CARE IF:   Symptoms get worse or do not improve in 2 to 4 weeks, despite treatment.  You develop numbness or weakness in either leg.  You lose bowel or bladder function.  Any of the following occur after surgery: fever, increased pain, swelling, redness, drainage of fluids, or bleeding in the affected area.  New, unexplained symptoms develop. (Drugs used in treatment may produce side effects.) EXERCISES  RANGE OF MOTION (ROM) AND STRETCHING EXERCISES - Low Back Sprain Most people with lower back pain will find that their symptoms get worse with excessive bending forward (flexion) or arching at the lower back (extension). The exercises that will help resolve your symptoms will focus on the opposite motion.  Your physician, physical therapist or athletic trainer will help you determine which exercises will be most helpful to resolve your lower back pain. Do not complete any exercises without first consulting with your caregiver. Discontinue any exercises which make your symptoms worse, until you speak to your caregiver. If you have pain, numbness or tingling which travels down into your buttocks, leg or foot, the goal of the therapy is for these symptoms to move closer to your back and eventually resolve. Sometimes, these leg symptoms will get better, but your lower back pain may worsen. This is often an indication of progress in your rehabilitation. Be very alert to any changes in your symptoms and the activities in which you participated in the 24 hours prior to the change. Sharing this information with your caregiver will allow him or her to most efficiently treat your condition. These exercises may help you when beginning to rehabilitate your injury. Your symptoms may resolve  with or without further involvement from your physician, physical therapist or athletic trainer. While completing these exercises, remember:   Restoring tissue flexibility helps normal motion to return to the joints. This allows healthier, less painful movement and activity.  An effective stretch should be held for at least 30 seconds.  A stretch should never be painful. You should only feel a gentle lengthening or release in the stretched tissue. FLEXION RANGE OF MOTION AND STRETCHING EXERCISES: STRETCH - Flexion, Single Knee to Chest   Lie on a firm bed or floor with both legs extended in front of you.  Keeping one leg in contact with the floor, bring your opposite knee to your chest. Hold your leg in place by either grabbing behind your thigh or at your knee.  Pull until you feel a gentle stretch in your low back. Hold __________ seconds.  Slowly release your grasp and repeat the exercise with the opposite side. Repeat __________ times. Complete this exercise __________ times per day.  STRETCH - Flexion, Double Knee to Chest  Lie on a firm bed or floor with both legs extended in front of you.  Keeping one leg in contact with the floor, bring your opposite knee to your chest.  Tense your stomach muscles to support your back and then lift your other knee to your chest. Hold your legs in place by either grabbing behind your thighs or at your knees.  Pull both  knees toward your chest until you feel a gentle stretch in your low back. Hold __________ seconds.  Tense your stomach muscles and slowly return one leg at a time to the floor. Repeat __________ times. Complete this exercise __________ times per day.  STRETCH - Low Trunk Rotation  Lie on a firm bed or floor. Keeping your legs in front of you, bend your knees so they are both pointed toward the ceiling and your feet are flat on the floor.  Extend your arms out to the side. This will stabilize your upper body by keeping your  shoulders in contact with the floor.  Gently and slowly drop both knees together to one side until you feel a gentle stretch in your low back. Hold for __________ seconds.  Tense your stomach muscles to support your lower back as you bring your knees back to the starting position. Repeat the exercise to the other side. Repeat __________ times. Complete this exercise __________ times per day  EXTENSION RANGE OF MOTION AND FLEXIBILITY EXERCISES: STRETCH - Extension, Prone on Elbows   Lie on your stomach on the floor, a bed will be too soft. Place your palms about shoulder width apart and at the height of your head.  Place your elbows under your shoulders. If this is too painful, stack pillows under your chest.  Allow your body to relax so that your hips drop lower and make contact more completely with the floor.  Hold this position for __________ seconds.  Slowly return to lying flat on the floor. Repeat __________ times. Complete this exercise __________ times per day.  RANGE OF MOTION - Extension, Prone Press Ups  Lie on your stomach on the floor, a bed will be too soft. Place your palms about shoulder width apart and at the height of your head.  Keeping your back as relaxed as possible, slowly straighten your elbows while keeping your hips on the floor. You may adjust the placement of your hands to maximize your comfort. As you gain motion, your hands will come more underneath your shoulders.  Hold this position __________ seconds.  Slowly return to lying flat on the floor. Repeat __________ times. Complete this exercise __________ times per day.  RANGE OF MOTION- Quadruped, Neutral Spine   Assume a hands and knees position on a firm surface. Keep your hands under your shoulders and your knees under your hips. You may place padding under your knees for comfort.  Drop your head and point your tailbone toward the ground below you. This will round out your lower back like an angry cat.  Hold this position for __________ seconds.  Slowly lift your head and release your tail bone so that your back sags into a large arch, like an old horse.  Hold this position for __________ seconds.  Repeat this until you feel limber in your low back.  Now, find your "sweet spot." This will be the most comfortable position somewhere between the two previous positions. This is your neutral spine. Once you have found this position, tense your stomach muscles to support your low back.  Hold this position for __________ seconds. Repeat __________ times. Complete this exercise __________ times per day.  STRENGTHENING EXERCISES - Low Back Sprain These exercises may help you when beginning to rehabilitate your injury. These exercises should be done near your "sweet spot." This is the neutral, low-back arch, somewhere between fully rounded and fully arched, that is your least painful position. When performed in this safe range  of motion, these exercises can be used for people who have either a flexion or extension based injury. These exercises may resolve your symptoms with or without further involvement from your physician, physical therapist or athletic trainer. While completing these exercises, remember:   Muscles can gain both the endurance and the strength needed for everyday activities through controlled exercises.  Complete these exercises as instructed by your physician, physical therapist or athletic trainer. Increase the resistance and repetitions only as guided.  You may experience muscle soreness or fatigue, but the pain or discomfort you are trying to eliminate should never worsen during these exercises. If this pain does worsen, stop and make certain you are following the directions exactly. If the pain is still present after adjustments, discontinue the exercise until you can discuss the trouble with your caregiver. STRENGTHENING - Deep Abdominals, Pelvic Tilt   Lie on a firm bed or  floor. Keeping your legs in front of you, bend your knees so they are both pointed toward the ceiling and your feet are flat on the floor.  Tense your lower abdominal muscles to press your low back into the floor. This motion will rotate your pelvis so that your tail bone is scooping upwards rather than pointing at your feet or into the floor. With a gentle tension and even breathing, hold this position for __________ seconds. Repeat __________ times. Complete this exercise __________ times per day.  STRENGTHENING - Abdominals, Crunches   Lie on a firm bed or floor. Keeping your legs in front of you, bend your knees so they are both pointed toward the ceiling and your feet are flat on the floor. Cross your arms over your chest.  Slightly tip your chin down without bending your neck.  Tense your abdominals and slowly lift your trunk high enough to just clear your shoulder blades. Lifting higher can put excessive stress on the lower back and does not further strengthen your abdominal muscles.  Control your return to the starting position. Repeat __________ times. Complete this exercise __________ times per day.  STRENGTHENING - Quadruped, Opposite UE/LE Lift   Assume a hands and knees position on a firm surface. Keep your hands under your shoulders and your knees under your hips. You may place padding under your knees for comfort.  Find your neutral spine and gently tense your abdominal muscles so that you can maintain this position. Your shoulders and hips should form a rectangle that is parallel with the floor and is not twisted.  Keeping your trunk steady, lift your right hand no higher than your shoulder and then your left leg no higher than your hip. Make sure you are not holding your breath. Hold this position for __________ seconds.  Continuing to keep your abdominal muscles tense and your back steady, slowly return to your starting position. Repeat with the opposite arm and leg. Repeat  __________ times. Complete this exercise __________ times per day.  STRENGTHENING - Abdominals and Quadriceps, Straight Leg Raise   Lie on a firm bed or floor with both legs extended in front of you.  Keeping one leg in contact with the floor, bend the other knee so that your foot can rest flat on the floor.  Find your neutral spine, and tense your abdominal muscles to maintain your spinal position throughout the exercise.  Slowly lift your straight leg off the floor about 6 inches for a count of 15, making sure to not hold your breath.  Still keeping your neutral spine,  slowly lower your leg all the way to the floor. Repeat this exercise with each leg __________ times. Complete this exercise __________ times per day. POSTURE AND BODY MECHANICS CONSIDERATIONS - Low Back Sprain Keeping correct posture when sitting, standing or completing your activities will reduce the stress put on different body tissues, allowing injured tissues a chance to heal and limiting painful experiences. The following are general guidelines for improved posture. Your physician or physical therapist will provide you with any instructions specific to your needs. While reading these guidelines, remember:  The exercises prescribed by your provider will help you have the flexibility and strength to maintain correct postures.  The correct posture provides the best environment for your joints to work. All of your joints have less wear and tear when properly supported by a spine with good posture. This means you will experience a healthier, less painful body.  Correct posture must be practiced with all of your activities, especially prolonged sitting and standing. Correct posture is as important when doing repetitive low-stress activities (typing) as it is when doing a single heavy-load activity (lifting). RESTING POSITIONS Consider which positions are most painful for you when choosing a resting position. If you have pain  with flexion-based activities (sitting, bending, stooping, squatting), choose a position that allows you to rest in a less flexed posture. You would want to avoid curling into a fetal position on your side. If your pain worsens with extension-based activities (prolonged standing, working overhead), avoid resting in an extended position such as sleeping on your stomach. Most people will find more comfort when they rest with their spine in a more neutral position, neither too rounded nor too arched. Lying on a non-sagging bed on your side with a pillow between your knees, or on your back with a pillow under your knees will often provide some relief. Keep in mind, being in any one position for a prolonged period of time, no matter how correct your posture, can still lead to stiffness. PROPER SITTING POSTURE In order to minimize stress and discomfort on your spine, you must sit with correct posture. Sitting with good posture should be effortless for a healthy body. Returning to good posture is a gradual process. Many people can work toward this most comfortably by using various supports until they have the flexibility and strength to maintain this posture on their own. When sitting with proper posture, your ears will fall over your shoulders and your shoulders will fall over your hips. You should use the back of the chair to support your upper back. Your lower back will be in a neutral position, just slightly arched. You may place a small pillow or folded towel at the base of your lower back for  support.  When working at a desk, create an environment that supports good, upright posture. Without extra support, muscles tire, which leads to excessive strain on joints and other tissues. Keep these recommendations in mind: CHAIR:  A chair should be able to slide under your desk when your back makes contact with the back of the chair. This allows you to work closely.  The chair's height should allow your eyes to be  level with the upper part of your monitor and your hands to be slightly lower than your elbows. BODY POSITION  Your feet should make contact with the floor. If this is not possible, use a foot rest.  Keep your ears over your shoulders. This will reduce stress on your neck and low back. INCORRECT  SITTING POSTURES  If you are feeling tired and unable to assume a healthy sitting posture, do not slouch or slump. This puts excessive strain on your back tissues, causing more damage and pain. Healthier options include:  Using more support, like a lumbar pillow.  Switching tasks to something that requires you to be upright or walking.  Talking a brief walk.  Lying down to rest in a neutral-spine position. PROLONGED STANDING WHILE SLIGHTLY LEANING FORWARD  When completing a task that requires you to lean forward while standing in one place for a long time, place either foot up on a stationary 2-4 inch high object to help maintain the best posture. When both feet are on the ground, the lower back tends to lose its slight inward curve. If this curve flattens (or becomes too large), then the back and your other joints will experience too much stress, tire more quickly, and can cause pain. CORRECT STANDING POSTURES Proper standing posture should be assumed with all daily activities, even if they only take a few moments, like when brushing your teeth. As in sitting, your ears should fall over your shoulders and your shoulders should fall over your hips. You should keep a slight tension in your abdominal muscles to brace your spine. Your tailbone should point down to the ground, not behind your body, resulting in an over-extended swayback posture.  INCORRECT STANDING POSTURES  Common incorrect standing postures include a forward head, locked knees and/or an excessive swayback. WALKING Walk with an upright posture. Your ears, shoulders and hips should all line-up. PROLONGED ACTIVITY IN A FLEXED  POSITION When completing a task that requires you to bend forward at your waist or lean over a low surface, try to find a way to stabilize 3 out of 4 of your limbs. You can place a hand or elbow on your thigh or rest a knee on the surface you are reaching across. This will provide you more stability, so that your muscles do not tire as quickly. By keeping your knees relaxed, or slightly bent, you will also reduce stress across your lower back. CORRECT LIFTING TECHNIQUES DO :  Assume a wide stance. This will provide you more stability and the opportunity to get as close as possible to the object which you are lifting.  Tense your abdominals to brace your spine. Bend at the knees and hips. Keeping your back locked in a neutral-spine position, lift using your leg muscles. Lift with your legs, keeping your back straight.  Test the weight of unknown objects before attempting to lift them.  Try to keep your elbows locked down at your sides in order get the best strength from your shoulders when carrying an object.  Always ask for help when lifting heavy or awkward objects. INCORRECT LIFTING TECHNIQUES DO NOT:   Lock your knees when lifting, even if it is a small object.  Bend and twist. Pivot at your feet or move your feet when needing to change directions.  Assume that you can safely pick up even a paperclip without proper posture. Document Released: 04/20/2005 Document Revised: 07/13/2011 Document Reviewed: 08/02/2008 [facility] Patient Information 2015 [place], [place]. This information is not intended to replace advice given to you by your health care provider. Make sure you discuss any questions you have with your health care provider.

## 2014-08-30 NOTE — Progress Notes (Signed)
   Subjective:    Patient ID: Dawn Bailey, female    DOB: October 02, 1934, 80 y.o.   MRN: [id number]  HPI Has been gardening and had been dragging a bag of soil about 3-4 days ago. Says it was uncomfortable and when stood up had a spasm. It eased off but by later that night it was gradually getting worse.  Turning makes it worse and triggers a spasm.  Taking IBU, ice and that helps. No weakness in the extremities,    Review of Systems     Objective:   Physical Exam  Constitutional: She is oriented to person, place, and time. She appears well-developed and well-nourished.  HENT:  Head: Normocephalic and atraumatic.  Musculoskeletal: She exhibits no edema.  Normal lumbar flexion, extension, rotation right and left.  Tender over the Left SI Joint.  Neg stright leg raise. Hip, knee, anke ankloe strength is 5/5 bilaterally.   Neurological: She is alert and oriented to person, place, and time.  Skin: Skin is warm and dry.  Psychiatric: She has a normal mood and affect.          Assessment & Plan:  Acute low back pain secondary to strain. Given H.O for home stretches. Trial of Zanaflex. Don't mix with soma.  conitnue IBU. Stop of any GI upset or irritation. F/U in 3 weeks or call sooner if note better.

## 2014-08-31 ENCOUNTER — Encounter: Payer: Self-pay | Admitting: Family Medicine

## 2014-09-07 ENCOUNTER — Other Ambulatory Visit: Payer: Self-pay | Admitting: Family Medicine

## 2014-11-07 ENCOUNTER — Ambulatory Visit: Payer: Self-pay | Admitting: Family Medicine

## 2014-11-07 ENCOUNTER — Encounter: Payer: Self-pay | Admitting: Gastroenterology

## 2014-11-09 ENCOUNTER — Telehealth: Payer: Self-pay | Admitting: *Deleted

## 2014-11-09 DIAGNOSIS — Z0000 Encounter for general adult medical examination without abnormal findings: Secondary | ICD-10-CM

## 2014-11-09 DIAGNOSIS — Z13220 Encounter for screening for lipoid disorders: Secondary | ICD-10-CM

## 2014-11-09 NOTE — Telephone Encounter (Signed)
Pt called and lvm asking if she should have labs done prior to her OV on 7/14.Dawn Bailey

## 2014-11-09 NOTE — Telephone Encounter (Signed)
I called pt and lvm informing her that she could get labs done and reminded her that she does need to schedule her MWE  Which is coming up in august.. Will fax labs. Advised her that she will need to fast for 8 hours prior to having these done.Dawn Bailey [place]

## 2014-11-15 ENCOUNTER — Ambulatory Visit: Payer: Self-pay | Admitting: Family Medicine

## 2014-11-16 ENCOUNTER — Emergency Department
Admission: EM | Admit: 2014-11-16 | Discharge: 2014-11-16 | Disposition: A | Discharge status: Home / Self Care | Payer: Medicare Other | Source: Home / Self Care | Attending: Family Medicine | Admitting: Family Medicine

## 2014-11-16 ENCOUNTER — Emergency Department (INDEPENDENT_AMBULATORY_CARE_PROVIDER_SITE_OTHER): Payer: Medicare Other

## 2014-11-16 ENCOUNTER — Encounter: Payer: Self-pay | Admitting: *Deleted

## 2014-11-16 DIAGNOSIS — M7061 Trochanteric bursitis, right hip: Secondary | ICD-10-CM | POA: Diagnosis not present

## 2014-11-16 DIAGNOSIS — S79911A Unspecified injury of right hip, initial encounter: Secondary | ICD-10-CM | POA: Diagnosis not present

## 2014-11-16 DIAGNOSIS — M25551 Pain in right hip: Secondary | ICD-10-CM | POA: Diagnosis not present

## 2014-11-16 DIAGNOSIS — M791 Myalgia: Secondary | ICD-10-CM | POA: Diagnosis not present

## 2014-11-16 MED ORDER — PREDNISONE 20 MG PO TABS: 20.0000 mg | ORAL_TABLET | Freq: Two times a day (BID) | ORAL | Status: DC

## 2014-11-16 NOTE — Discharge Instructions (Signed)
Apply ice pack for 20 to 30 minutes, 3 to 4 times daily  Continue until pain decreases.  Begin range of motion and stretching exercises.   Hip Bursitis Bursitis is a swelling and soreness (inflammation) of a fluid-filled sac (bursa). This sac overlies and protects the joints.  CAUSES   Injury.  Overuse of the muscles surrounding the joint.  Arthritis.  Gout.  Infection.  Cold weather.  Inadequate warm-up and conditioning prior to activities. The cause may not be known.  SYMPTOMS   Mild to severe irritation.  Tenderness and swelling over the outside of the hip.  Pain with motion of the hip.  If the bursa becomes infected, a fever may be present. Redness, tenderness, and warmth will develop over the hip. Symptoms usually lessen in 3 to 4 weeks with treatment, but can come back. TREATMENT If conservative treatment does not work, your caregiver may advise draining the bursa and injecting cortisone into the area. This may speed up the healing process. This may also be used as an initial treatment of choice. HOME CARE INSTRUCTIONS   Apply ice to the affected area for 15-20 minutes every 3 to 4 hours while awake for the first 2 days. Put the ice in a plastic bag and place a towel between the bag of ice and your skin.  Rest the painful joint as much as possible, but continue to put the joint through a normal range of motion at least 4 times per day. When the pain lessens, begin normal, slow movements and usual activities to help prevent stiffness of the hip.  Only take over-the-counter or prescription medicines for pain, discomfort, or fever as directed by your caregiver.  Use crutches to limit weight bearing on the hip joint, if advised.  Elevate your painful hip to reduce swelling. Use pillows for propping and cushioning your legs and hips.  Gentle massage may provide comfort and decrease swelling. SEEK IMMEDIATE MEDICAL CARE IF:   Your pain increases even during treatment,  or you are not improving.  You have a fever.  You have heat and inflammation over the involved bursa.  You have any other questions or concerns. MAKE SURE YOU:   Understand these instructions.  Will watch your condition.  Will get help right away if you are not doing well or get worse. Document Released: 10/10/2001 Document Revised: 07/13/2011 Document Reviewed: 05/09/2008 [facility] Patient Information 2015 [place], [place]. This information is not intended to replace advice given to you by your health care provider. Make sure you discuss any questions you have with your health care provider.

## 2014-11-16 NOTE — ED Provider Notes (Signed)
CSN: [id number]     Arrival date & time 11/16/14  1204 History   First MD Initiated Contact with Patient 11/16/14 1234     Chief Complaint  Patient presents with  . Hip Pain     HPI Comments: 1.5 months ago patient was sleeping in an unfamiliar bed and accidentally rolled off onto the floor, landing on her right hip.  Her hip improved until 2.5 weeks ago she fell backwards on a hill in her yard while gardening.  She landed on her back, but subsequently had increased pain in her right hip.  Her hip and back again improved until 2 days ago when she went shopping.  After more walking than usual she developed increasing right lower back and hip pain that has persisted.  She has pain when walking and arising from a chair.  Her pain does not radiate.  She denies bowel or bladder dysfunction, and no saddle numbness.    Patient is a 80 y.o. female presenting with hip pain. The history is provided by the patient.  Hip Pain This is a new problem. Episode onset: 1.5 months ago. The problem occurs constantly. The problem has been gradually worsening. Pertinent negatives include no abdominal pain. The symptoms are aggravated by walking and bending. Treatments tried: SOMA and Tylenol. The treatment provided mild relief.    Past Medical History  Diagnosis Date  . Depression   . Allergy   . IBS (irritable bowel syndrome)   . Hearing loss   . Lumbar sprain and strain   . Back sprain/strain, thoracic   . Nevus     atypical  . Gastric ulcer   . Panic attacks   . Osteopenia   . Migraines   . Hypertension   . Hyperlipidemia   . Obesity   . Anemia   . Diverticulosis   . Hemorrhoids    Past Surgical History  Procedure Laterality Date  . Cataract surgery  2004    bilateral   Family History  Problem Relation Age of Onset  . Depression Mother   . Diabetes Father   . Hypertension Father   . Stroke Father   . Hypertension Brother    History  Substance Use Topics  . Smoking status: Former Smoker     Quit date: 05/05/1995  . Smokeless tobacco: Never Used  . Alcohol Use: 0.0 oz/week    .5 drink(s) per week     Comment: per week   OB History    No data available     Review of Systems  Gastrointestinal: Negative for abdominal pain.  All other systems reviewed and are negative.   Allergies  Aripiprazole; Cephalexin; Lipitor; Simvastatin; Skelaxin; Sulfonamide derivatives; and Zanaflex  Home Medications   Prior to Admission medications   Medication Sig Start Date End Date Taking? Authorizing Provider  carisoprodol (SOMA) 350 MG tablet take 1 tablet by mouth once daily if needed for muscle spasm and migraines 09/10/14   Hali Marry, MD  clonazePAM (KLONOPIN) 0.5 MG tablet Take 1 tablet (0.5 mg total) by mouth at bedtime. 08/08/14   Hali Marry, MD  dicyclomine (BENTYL) 20 MG tablet take 1 tablet by mouth every 6 hours 02/26/14   Hali Marry, MD  loperamide (IMODIUM A-D) 2 MG tablet Take 2 mg by mouth as needed.      Historical Provider, MD  metoprolol tartrate (LOPRESSOR) 25 MG tablet take 1 tablet by mouth twice a day 02/09/14   Hali Marry, MD  predniSONE (DELTASONE) 20 MG tablet Take 1 tablet (20 mg total) by mouth 2 (two) times daily. Take with food. 11/16/14   Kandra Nicolas, MD  promethazine (PHENERGAN) 25 MG tablet take 1 tablet by mouth every 6 hours if needed for nausea    Hali Marry, MD  sertraline (ZOLOFT) 100 MG tablet Take 2 tablets (200 mg total) by mouth daily. 07/31/13 07/31/14  Waldon Merl, MD  tiZANidine (ZANAFLEX) 4 MG tablet Take 1 tablet (4 mg total) by mouth every 8 (eight) hours as needed for muscle spasms. 08/30/14   Hali Marry, MD  traZODone (DESYREL) 100 MG tablet Take 100 mg by mouth at bedtime. 03/12/14   Historical Provider, MD   BP 100/63 mmHg  Pulse 80  Resp 16  Wt 190 lb (86.183 kg)  SpO2 97% Physical Exam  Constitutional: She is oriented to person, place, and time. She appears well-developed  and well-nourished. No distress.  HENT:  Head: Normocephalic.  Eyes: Pupils are equal, round, and reactive to light.  Neck: Normal range of motion.  Cardiovascular: Normal heart sounds.   Pulmonary/Chest: Breath sounds normal.  Musculoskeletal:       Right hip: She exhibits tenderness and bony tenderness. She exhibits normal range of motion, normal strength, no swelling, no crepitus, no deformity and no laceration.  Right hip reveals distinct tenderness over the greater trochanter.  Palpating the greater trochanter during resisted lateral abduction of the hip recreates her pain.   Back has tenderness to palpation over the right SI joint. FABER test negative  Neurological: She is alert and oriented to person, place, and time.  Skin: Skin is warm and dry. No rash noted.  Nursing note and vitals reviewed.   ED Course  Procedures None    Imaging Review Dg Hip Unilat With Pelvis 2-3 Views Right  11/16/2014   CLINICAL DATA:  [place] out of bed on [place] Day. Right buttock pain since that time.  EXAM: DG HIP (WITH OR WITHOUT PELVIS) 2-3V RIGHT  COMPARISON:  03/17/2012  FINDINGS: There is no evidence of hip fracture or dislocation. Mild degenerative changes in the lower lumbar spine.  IMPRESSION: Negative.   Electronically Signed   By: Nolon Nations M.D.   On: 11/16/2014 13:37     MDM   1. Trochanteric bursitis of right hip    Begin prednisone burst. Apply ice pack for 20 to 30 minutes, 3 to 4 times daily  Continue until pain decreases.  Begin range of motion and stretching exercises. Followup with Family Doctor if not improved in one week.     Kandra Nicolas, MD 11/16/14 [phone]

## 2014-11-16 NOTE — ED Notes (Signed)
Pt rolled out of bed while on vacation in a smaller bed 1 month ago hurting right hip. 2-3 weeks ago she was working on a hill, stepped back and fell re-injuring the right hip. She has an orthopaedist with [facility], [place] august 12th.

## 2014-11-27 ENCOUNTER — Other Ambulatory Visit: Payer: Self-pay | Admitting: Family Medicine

## 2014-12-05 DIAGNOSIS — Z0000 Encounter for general adult medical examination without abnormal findings: Secondary | ICD-10-CM | POA: Diagnosis not present

## 2014-12-05 DIAGNOSIS — Z13220 Encounter for screening for lipoid disorders: Secondary | ICD-10-CM | POA: Diagnosis not present

## 2014-12-06 ENCOUNTER — Ambulatory Visit (INDEPENDENT_AMBULATORY_CARE_PROVIDER_SITE_OTHER): Payer: Medicare Other | Admitting: Family Medicine

## 2014-12-06 ENCOUNTER — Encounter: Payer: Self-pay | Admitting: Family Medicine

## 2014-12-06 VITALS — BP 110/68 | HR 78 | Ht 67.0 in | Wt 193.0 lb

## 2014-12-06 DIAGNOSIS — M461 Sacroiliitis, not elsewhere classified: Secondary | ICD-10-CM | POA: Diagnosis not present

## 2014-12-06 DIAGNOSIS — Z0000 Encounter for general adult medical examination without abnormal findings: Secondary | ICD-10-CM

## 2014-12-06 LAB — COMPLETE METABOLIC PANEL WITH GFR
ALT: 16 U/L (ref 6–29)
AST: 25 U/L (ref 10–35)
Albumin: 4.2 g/dL (ref 3.6–5.1)
Alkaline Phosphatase: 74 U/L (ref 33–130)
BUN: 14 mg/dL (ref 7–25)
CO2: 25 mmol/L (ref 20–31)
Calcium: 9.6 mg/dL (ref 8.6–10.4)
Chloride: 100 mmol/L (ref 98–110)
Creat: 0.79 mg/dL (ref 0.60–0.93)
GFR, Est African American: 84 mL/min (ref >=60)
GFR, Est Non African American: 72 mL/min (ref >=60)
Glucose, Bld: 81 mg/dL (ref 65–99)
Potassium: 4.2 mmol/L (ref 3.5–5.3)
Sodium: 138 mmol/L (ref 135–146)
Total Bilirubin: 0.4 mg/dL (ref 0.2–1.2)
Total Protein: 6.9 g/dL (ref 6.1–8.1)

## 2014-12-06 LAB — LIPID PANEL
Cholesterol: 328 mg/dL — ABNORMAL HIGH (ref 125–200)
HDL: 68 mg/dL (ref >=46)
LDL Cholesterol: 207 mg/dL — ABNORMAL HIGH (ref <130)
Total CHOL/HDL Ratio: 4.8 Ratio (ref <=5.0)
Triglycerides: 265 mg/dL — ABNORMAL HIGH (ref <150)
VLDL: 53 mg/dL — ABNORMAL HIGH (ref <30)

## 2014-12-06 NOTE — Progress Notes (Signed)
Subjective:    Dawn Bailey is a 80 y.o. female who presents for Medicare Annual/Subsequent preventive examination.  Preventive Screening-Counseling & Management  Tobacco History  Smoking status  . Former Smoker  . Quit date: 05/05/1995  Smokeless tobacco  . Never Used     Problems Prior to Visit 1. Gets stressed listening to th enews.   2. Hasn't been able to exericse bc of recent right low back pain after 2 recent falls.  She said she fell initially several weeks ago out of bed. [place] the [place] and sleeping in a strange bed and actually rolled out of the bed and hurt her right hip. About a week or so later she was home in her garden which is on a steep hill and her husband called out her name and she turned and stepped backward and lost her balance and fell and again hurt her posterior back/right hip area. She actually went to urgent care and had x-rays that were negative for a fracture. And they gave her a handout for exercises for bursitis. Today she is pointing to an area of pain just to the right of the spine in between the spine in the hip. She has not been taking any medication for it recently. It hurts more when she sits for prolonged periods or sometimes when rising up from a chair just a certain change in position while trigger sharp pain. No pain over the outer right hip.  Current Problems (verified) Patient Active Problem List   Diagnosis Date Noted  . Insomnia 05/08/2014  . Overweight(278.02) 06/15/2011  . Major depressive disorder, recurrent episode, moderate 05/06/2011    Class: Chronic  . Tremor 01/16/2011  . Chronic diarrhea 02/19/2010  . UNSPECIFIED HEARING LOSS 07/19/2009  . LUMBAR SPRAIN AND STRAIN 11/28/2008  . RHINITIS, ALLERGIC NOS 09/23/2006  . HYPERCHOLESTEROLEMIA 02/09/2006  . Posttraumatic stress disorder 02/09/2006  . Migraine 02/09/2006  . HYPERTENSION, BENIGN SYSTEMIC 02/09/2006  . OSTEOPENIA 02/09/2006    Medications Prior to  Visit Current Outpatient Prescriptions on File Prior to Visit  Medication Sig Dispense Refill  . carisoprodol (SOMA) 350 MG tablet take 1 tablet by mouth once daily if needed for muscle spasm and migraines 60 tablet 1  . clonazePAM (KLONOPIN) 0.5 MG tablet Take 1 tablet (0.5 mg total) by mouth at bedtime. 30 tablet 2  . dicyclomine (BENTYL) 20 MG tablet take 1 tablet by mouth every 6 hours 120 tablet 1  . loperamide (IMODIUM A-D) 2 MG tablet Take 2 mg by mouth as needed.      . metoprolol tartrate (LOPRESSOR) 25 MG tablet take 1 tablet by mouth twice a day 180 tablet 3  . promethazine (PHENERGAN) 25 MG tablet take 1 tablet by mouth every 6 hours if needed for nausea 30 tablet 3  . traZODone (DESYREL) 100 MG tablet Take 100 mg by mouth at bedtime.  0   No current facility-administered medications on file prior to visit.    Current Medications (verified) Current Outpatient Prescriptions  Medication Sig Dispense Refill  . carisoprodol (SOMA) 350 MG tablet take 1 tablet by mouth once daily if needed for muscle spasm and migraines 60 tablet 1  . clonazePAM (KLONOPIN) 0.5 MG tablet Take 1 tablet (0.5 mg total) by mouth at bedtime. 30 tablet 2  . dicyclomine (BENTYL) 20 MG tablet take 1 tablet by mouth every 6 hours 120 tablet 1  . loperamide (IMODIUM A-D) 2 MG tablet Take 2 mg by mouth as needed.      Marland Kitchen  metoprolol tartrate (LOPRESSOR) 25 MG tablet take 1 tablet by mouth twice a day 180 tablet 3  . promethazine (PHENERGAN) 25 MG tablet take 1 tablet by mouth every 6 hours if needed for nausea 30 tablet 3  . traZODone (DESYREL) 100 MG tablet Take 100 mg by mouth at bedtime.  0   No current facility-administered medications for this visit.     Allergies (verified) Aripiprazole; Cephalexin; Lipitor; Simvastatin; Skelaxin; Sulfonamide derivatives; and Zanaflex   PAST HISTORY  Family History Family History  Problem Relation Age of Onset  . Depression Mother   . Diabetes Father   .  Hypertension Father   . Stroke Father   . Hypertension Brother     Social History History  Substance Use Topics  . Smoking status: Former Smoker    Quit date: 05/05/1995  . Smokeless tobacco: Never Used  . Alcohol Use: 0.0 oz/week    .5 drink(s) per week     Comment: per week     Are there smokers in your home (other than you)? No  Risk Factors Current exercise habits: walks her dog and gardens  Dietary issues discussed: None   Cardiac risk factors: advanced age (older than 5 for men, 73 for women), family history of premature cardiovascular disease, hypertension and sedentary lifestyle.  Depression Screen (Note: if answer to either of the following is "Yes", a more complete depression screening is indicated)   Over the past two weeks, have you felt down, depressed or hopeless? Yes  Over the past two weeks, have you felt little interest or pleasure in doing things? No  Have you lost interest or pleasure in daily life? No  Do you often feel hopeless? No  Do you cry easily over simple problems? No  Activities of Daily Living In your present state of health, do you have any difficulty performing the following activities?:  Driving? No Managing money?  No Feeding yourself? No Getting from bed to chair? No  Climbing a flight of stairs? No Preparing food and eating?: No Bathing or showering? No Getting dressed: No Getting to the toilet? No Using the toilet:No Moving around from place to place: No In the past year have you fallen or had a near fall?:Yes    Hearing Difficulties: No Do you often ask people to speak up or repeat themselves? No Do you experience ringing or noises in your ears? No Do you have difficulty understanding soft or whispered voices? Yes   Do you feel that you have a problem with memory? No  Do you often misplace items? No  Do you feel safe at home?  Yes  Cognitive Testing  Alert? Yes  Normal Appearance?Yes  Oriented to person? Yes  Place? Yes    Time? Yes  Recall of three objects?  Yes  Can perform simple calculations? Yes  Displays appropriate judgment?Yes  Can read the correct time from a watch face?Yes   Advanced Directives have been discussed with the patient? Yes   6 CIT score of 0/28 ( normal).    List the Names of Other Physician/Practitioners you currently use: 1.    Indicate any recent Medical Services you may have received from other than [facility] providers in the past year (date may be approximate).  Immunization History  Administered Date(s) Administered  . Influenza Split 02/19/2012  . Influenza Whole 02/04/2006, 03/09/2007, 02/02/2008, 01/24/2010, 01/16/2011  . Influenza,inj,Quad PF,36+ Mos 01/09/2013, 01/01/2014  . Pneumococcal Conjugate-13 11/13/2013  . Pneumococcal Polysaccharide-23 01/03/2004  . Td 07/03/2003  .  Tdap 11/13/2013    Screening Tests Health Maintenance  Topic Date Due  . INFLUENZA VACCINE  12/03/2014  . COLONOSCOPY  05/05/2019  . TETANUS/TDAP  11/14/2023  . DEXA SCAN  Completed  . ZOSTAVAX  Addressed  . PNA vac Low Risk Adult  Completed    All answers were reviewed with the patient and necessary referrals were made:  METHENEY,CATHERINE, MD   12/06/2014   History reviewed: allergies, current medications, past family history, past medical history, past social history, past surgical history and problem list  Review of Systems A comprehensive review of systems was negative.    Objective:     Vision by Snellen chart: right eye:20/25, left eye:20/25  Body mass index is 30.22 kg/(m^2). BP 110/68 mmHg  Pulse 78  Ht 5\' 7"  (1.702 m)  Wt 193 lb (87.544 kg)  BMI 30.22 kg/m2  BP 110/68 mmHg  Pulse 78  Ht 5\' 7"  (1.702 m)  Wt 193 lb (87.544 kg)  BMI 30.22 kg/m2 General appearance: alert, cooperative and appears stated age Head: Normocephalic, without obvious abnormality, atraumatic Eyes: conj clear, EOMI, PPERLA Ears: normal TM's and external ear canals both ears Nose: Nares  normal. Septum midline. Mucosa normal. No drainage or sinus tenderness. Throat: lips, mucosa, and tongue normal; teeth and gums normal Neck: no adenopathy, no carotid bruit, no JVD, supple, symmetrical, trachea midline and thyroid not enlarged, symmetric, no tenderness/mass/nodules Back: symmetric, no curvature. ROM normal. No CVA tenderness. Lungs: clear to auscultation bilaterally Heart: regular rate and rhythm, S1, S2 normal, no murmur, click, rub or gallop Abdomen: soft, non-tender; bowel sounds normal; no masses,  no organomegaly Extremities: extremities normal, atraumatic, no cyanosis or edema Pulses: 2+ and symmetric Skin: Skin color, texture, turgor normal. No rashes or lesions Lymph nodes: Cervical, supraclavicular, and axillary nodes normal. Neurologic: Alert and oriented X 3, normal strength and tone. Normal symmetric reflexes. Normal coordination and gait    Very tender over the Right SI Joint. No pain with hip flexion, exteion or adduction or abduction.        Assessment:     Medicare Wellness EXam      Plan:     During the course of the visit the patient was educated and counseled about appropriate screening and preventive services including:    Screening mammography - discussed pros and cons of screening [place] 77. She plans ot schedule in [place].   Vaccines are UTD  Labs are UTD. Reviewed those today.  Hyperlpidemia- recheck in 6 months.   Encourage more activity.  Right SI joint inflammation from 2 falls. Recommend anti-inflammatory as needed. Also given handout on some exercises and stretches to do on her on at home. If not improving over the next 3-4 weeks and please let me know. Also recommend avoid prolonged sitting which can exacerbate her symptoms.  Diet review for nutrition referral? Yes ____  Not Indicated X__   Patient Instructions (the written plan) was given to the patient.  Medicare Attestation I have personally reviewed: The patient's medical  and social history Their use of alcohol, tobacco or illicit drugs Their current medications and supplements The patient's functional ability including ADLs,fall risks, home safety risks, cognitive, and hearing and visual impairment Diet and physical activities Evidence for depression or mood disorders  The patient's weight, height, BMI, and visual acuity have been recorded in the chart.  I have made referrals, counseling, and provided education to the patient based on review of the above and I have provided the patient  with a written personalized care plan for preventive services.     METHENEY,CATHERINE, MD   12/06/2014

## 2014-12-06 NOTE — Patient Instructions (Signed)
Keep up a regular exercise program and make sure you are eating a healthy diet Try to eat 4 servings of dairy a day, or if you are lactose intolerant take a calcium with vitamin D daily.  Your vaccines are up to date.   

## 2015-01-03 ENCOUNTER — Other Ambulatory Visit: Payer: Self-pay | Admitting: Family Medicine

## 2015-02-01 ENCOUNTER — Other Ambulatory Visit: Payer: Self-pay | Admitting: Family Medicine

## 2015-02-12 ENCOUNTER — Encounter: Payer: Self-pay | Admitting: Family Medicine

## 2015-02-12 ENCOUNTER — Other Ambulatory Visit: Payer: Self-pay | Admitting: Family Medicine

## 2015-02-12 ENCOUNTER — Ambulatory Visit (INDEPENDENT_AMBULATORY_CARE_PROVIDER_SITE_OTHER): Payer: Medicare Other | Admitting: Family Medicine

## 2015-02-12 VITALS — BP 130/88 | HR 69 | Wt 201.0 lb

## 2015-02-12 DIAGNOSIS — F329 Major depressive disorder, single episode, unspecified: Secondary | ICD-10-CM

## 2015-02-12 DIAGNOSIS — R2689 Other abnormalities of gait and mobility: Secondary | ICD-10-CM | POA: Diagnosis not present

## 2015-02-12 DIAGNOSIS — Z23 Encounter for immunization: Secondary | ICD-10-CM

## 2015-02-12 DIAGNOSIS — Z9181 History of falling: Secondary | ICD-10-CM | POA: Diagnosis not present

## 2015-02-12 DIAGNOSIS — Z1329 Encounter for screening for other suspected endocrine disorder: Secondary | ICD-10-CM | POA: Diagnosis not present

## 2015-02-12 DIAGNOSIS — F32A Depression, unspecified: Secondary | ICD-10-CM

## 2015-02-12 DIAGNOSIS — R296 Repeated falls: Secondary | ICD-10-CM

## 2015-02-12 DIAGNOSIS — R269 Unspecified abnormalities of gait and mobility: Secondary | ICD-10-CM | POA: Diagnosis not present

## 2015-02-12 DIAGNOSIS — R799 Abnormal finding of blood chemistry, unspecified: Secondary | ICD-10-CM | POA: Diagnosis not present

## 2015-02-12 LAB — CBC
HCT: 36.5 % (ref 36.0–46.0)
Hemoglobin: 12.1 g/dL (ref 12.0–15.0)
MCH: 25.1 pg — ABNORMAL LOW (ref 26.0–34.0)
MCHC: 33.2 g/dL (ref 30.0–36.0)
MCV: 75.7 fL — ABNORMAL LOW (ref 78.0–100.0)
Platelets: 182 10*3/uL (ref 150–400)
RBC: 4.82 MIL/uL (ref 3.87–5.11)
RDW: 16.2 % — ABNORMAL HIGH (ref 11.5–15.5)
WBC: 4.3 10*3/uL (ref 4.0–10.5)

## 2015-02-12 NOTE — Patient Instructions (Signed)
Decrease xanax to half a tab once a day.  Decrease soma to half a tab at bedtime.

## 2015-02-12 NOTE — Progress Notes (Signed)
Subjective:    Patient ID: Dawn Bailey, female    DOB: [birth year], 80 y.o.   MRN: [id number]  HPI Feels like she is having balance issues the last 4 weeks. Then fell in the bathtub and hit the back of her head.  Didn't LOC.   Feel about a week ago again stepping through door to a store/shop. Says it visually looked steaper than it was.  Back in May she accidentally rolled out of bed.  Though says the bed was smaller than her usual bed.   No room spinning.  No presyncopal episdoes.  Sometimes when she gets up after sitting for a periof of time feels like she wants to continue to go forward.    She has been under a lot of stress taking care of her husband who she feels is dealing with dementia.   She is on zoloft 200mg  QD with Dr. Laurance Flatten, her psychiatrist. She recently started taking her clonazepam daily and takes a half a tab in the morning and half a tab in the evening. She has also been taking her soma almost every night for her headaches. These are chronic and have been ongoing for several years.   Review of Systems  BP 130/88 mmHg  Pulse 69  Wt 201 lb (91.173 kg)  SpO2 98%    Allergies  Allergen Reactions  . Aripiprazole     REACTION: nightmares.  . Cephalexin     REACTION: vomiting  . Lipitor [Atorvastatin] Other (See Comments)    Muscle pain  . Simvastatin Other (See Comments)    Muscle aches/   . Skelaxin [Metaxalone] Other (See Comments)    Cough, SOB  . Sulfonamide Derivatives   . Zanaflex [Tizanidine Hcl]     dizziness    Past Medical History  Diagnosis Date  . Depression   . Allergy   . IBS (irritable bowel syndrome)   . Hearing loss   . Lumbar sprain and strain   . Back sprain/strain, thoracic   . Nevus     atypical  . Gastric ulcer   . Panic attacks   . Osteopenia   . Migraines   . Hypertension   . Hyperlipidemia   . Obesity   . Anemia   . Diverticulosis   . Hemorrhoids     Past Surgical History  Procedure Laterality Date  . Cataract surgery   2004    bilateral    Social History   Social History  . Marital Status: Married    Spouse Name: N/A  . Number of Children: N/A  . Years of Education: N/A   Occupational History  . Not on file.   Social History Main Topics  . Smoking status: Former Smoker    Quit date: 05/05/1995  . Smokeless tobacco: Never Used  . Alcohol Use: 0.0 oz/week    .5 drink(s) per week     Comment: per week  . Drug Use: No     Comment: Caffiene: 1 cup of tea  . Sexual Activity: No     Comment: married, 2 children, LMP over 10 yrs ago, walks everyday, daily caffeine.   Other Topics Concern  . Not on file   Social History Narrative    Family History  Problem Relation Age of Onset  . Depression Mother   . Diabetes Father   . Hypertension Father   . Stroke Father   . Hypertension Brother     Outpatient Encounter Prescriptions as of 02/12/2015  Medication  Sig  . carisoprodol (SOMA) 350 MG tablet take 1 tablet by mouth once daily if needed for muscle spasm and migraines  . clonazePAM (KLONOPIN) 0.5 MG tablet Take 1 tablet (0.5 mg total) by mouth at bedtime.  . dicyclomine (BENTYL) 20 MG tablet take 1 tablet by mouth every 6 hours  . loperamide (IMODIUM A-D) 2 MG tablet Take 2 mg by mouth as needed.    . metoprolol tartrate (LOPRESSOR) 25 MG tablet take 1 tablet by mouth twice a day  . promethazine (PHENERGAN) 25 MG tablet take 1 tablet by mouth every 6 hours if needed for nausea  . sertraline (ZOLOFT) 100 MG tablet Take 200 mg by mouth daily.  . traZODone (DESYREL) 100 MG tablet Take 100 mg by mouth at bedtime.   No facility-administered encounter medications on file as of 02/12/2015.          Objective:   Physical Exam  Constitutional: She is oriented to person, place, and time. She appears well-developed and well-nourished.  HENT:  Head: Normocephalic and atraumatic.  Right Ear: External ear normal.  Left Ear: External ear normal.  Nose: Nose normal.  Mouth/Throat: Oropharynx  is clear and moist.  TMs and canals are clear.   Eyes: Conjunctivae and EOM are normal. Pupils are equal, round, and reactive to light.  Neck: Neck supple. No thyromegaly present.  Cardiovascular: Normal rate, regular rhythm and normal heart sounds.   Pulmonary/Chest: Effort normal and breath sounds normal. She has no wheezes.  Musculoskeletal:  Normal gait.   Lymphadenopathy:    She has no cervical adenopathy.  Neurological: She is alert and oriented to person, place, and time. She has normal reflexes. No cranial nerve deficit. Coordination normal.  Negative Dix-Hallpike maneuver  Skin: Skin is warm and dry.  Psychiatric: She has a normal mood and affect.          Assessment & Plan:  Frequent falls - she is on several medications that can cause inc ristk of falls like trazodone, soma. We discussed decreasing the soma to half a tab at bedtime. Also discussed that the mixture of medications consistently increase her risk of falls. I encouraged her to decrease her Klonopin to half a tab once a day instead of twice a day. Encouraged her to take that in the morning and not in the evening. Avoid mixing that with the soma which can cause oversedation. Eventually had like to wean the trazodone as well but we will keep that for now. We'll see if she feels better over the next couple weeks after decreasing the clonazepam and the soma. Ultimately I would like to get rid of these medications completely.  Imbalance-seems to come and go. Encouraged her to check her blood pressure when this happens just to make sure that it's not low or high.  Again may be medication related to her going to back off on some of her medications. She had a negative Dix-Hallpike maneuver. We will also check some blood work as well..   Depression-since she's been feeling more depressed over the last several months I strongly encouraged her to get back in with her psychiatrist so that they can adjust her medications since she  is Re: On the maximum dose of sertraline.

## 2015-02-13 LAB — BASIC METABOLIC PANEL WITH GFR
BUN: 7 mg/dL (ref 7–25)
CO2: 31 mmol/L (ref 20–31)
Calcium: 9.4 mg/dL (ref 8.6–10.4)
Chloride: 99 mmol/L (ref 98–110)
Creat: 0.64 mg/dL (ref 0.60–0.93)
GFR, Est African American: >89 mL/min (ref >=60)
GFR, Est Non African American: 86 mL/min (ref >=60)
Glucose, Bld: 86 mg/dL (ref 65–99)
Potassium: 4.2 mmol/L (ref 3.5–5.3)
Sodium: 139 mmol/L (ref 135–146)

## 2015-02-13 LAB — FERRITIN: Ferritin: 38 ng/mL (ref 10–291)

## 2015-02-13 LAB — TSH: TSH: 1.536 u[IU]/mL (ref 0.350–4.500)

## 2015-02-13 NOTE — Progress Notes (Signed)
Left detailed message.   

## 2015-02-18 ENCOUNTER — Encounter: Payer: Self-pay | Admitting: Family Medicine

## 2015-02-26 ENCOUNTER — Other Ambulatory Visit: Payer: Self-pay | Admitting: *Deleted

## 2015-02-26 MED ORDER — ENALAPRIL MALEATE 5 MG PO TABS: ORAL_TABLET | ORAL | Status: DC

## 2015-03-12 ENCOUNTER — Ambulatory Visit: Payer: Self-pay | Admitting: Family Medicine

## 2015-04-06 ENCOUNTER — Other Ambulatory Visit: Payer: Self-pay | Admitting: Family Medicine

## 2015-04-30 ENCOUNTER — Telehealth: Payer: Self-pay | Admitting: *Deleted

## 2015-04-30 ENCOUNTER — Other Ambulatory Visit: Payer: Self-pay | Admitting: *Deleted

## 2015-04-30 ENCOUNTER — Other Ambulatory Visit: Payer: Self-pay | Admitting: Family Medicine

## 2015-04-30 MED ORDER — CARISOPRODOL 350 MG PO TABS: ORAL_TABLET | ORAL | Status: DC

## 2015-04-30 NOTE — Telephone Encounter (Signed)
Reordered Soma due to Henry signing the first refill.

## 2015-05-03 ENCOUNTER — Other Ambulatory Visit: Payer: Self-pay | Admitting: Family Medicine

## 2015-05-03 ENCOUNTER — Ambulatory Visit (INDEPENDENT_AMBULATORY_CARE_PROVIDER_SITE_OTHER): Payer: Medicare Other | Admitting: Family Medicine

## 2015-05-03 ENCOUNTER — Encounter: Payer: Self-pay | Admitting: Family Medicine

## 2015-05-03 VITALS — BP 120/51 | HR 75 | Wt 198.0 lb

## 2015-05-03 DIAGNOSIS — L989 Disorder of the skin and subcutaneous tissue, unspecified: Secondary | ICD-10-CM

## 2015-05-03 DIAGNOSIS — D0462 Carcinoma in situ of skin of left upper limb, including shoulder: Secondary | ICD-10-CM | POA: Diagnosis not present

## 2015-05-03 MED ORDER — IMIQUIMOD 5 % EX CREA: TOPICAL_CREAM | Freq: Every day | CUTANEOUS | Status: DC

## 2015-05-03 NOTE — Progress Notes (Signed)
   Subjective:    Patient ID: Dawn Bailey, female    DOB: 1934-12-18, 79 y.o.   MRN: [id number]  HPI 80 year old female comes in today complaining of a spot on her left wrist. She said it has been an age spot for years and then all of a sudden last week it started to develop a red ring around the edge. She denied any trauma or injury. Now it looks like it's crusting and scaling. She has not used any treatments on it. And came in because of the quick change in the mall.  We have also frozen the lesion on the bridge of her nose a couple of times that looks consistent with actinic keratoses. She says it's starting to come back again and has a pink appearance. We have never tried Aldara cream.  Review of Systems     Objective:   Physical Exam  Constitutional: She is oriented to person, place, and time. She appears well-developed and well-nourished.  HENT:  Head: Normocephalic and atraumatic.  Eyes: Conjunctivae and EOM are normal.  Cardiovascular: Normal rate.   Pulmonary/Chest: Effort normal.  Neurological: She is alert and oriented to person, place, and time.  Skin: Skin is warm and dry. No pallor.  She has a 2 mm pink round lesion on the bridge of her nose.  On her posterior left wrist she has an oval shaped lession that is pin/purple in appearance with thickened scale.  Approx 1.5 cm.   Psychiatric: She has a normal mood and affect. Her behavior is normal.  Vitals reviewed.         Assessment & Plan:  Atypical lesion on left posterior wrist-recommend shave biopsy because of the quick nature of change in because of the thickened scale. Concerning for possible early squamous cell skin cancer.  Atypical lesion on nasal bridge. It has a pink more shiny appearance possibly an early basal cell. Recommend a trial of Aldara cream to try to spare skin. Warned about potential for skin irritation. If not healing and will refer to dermatology for further evaluation and possible excision.

## 2015-05-03 NOTE — Addendum Note (Signed)
Addended by: Huel Cote on: 05/03/2015 10:43 AM   Modules accepted: Orders

## 2015-05-07 NOTE — Addendum Note (Signed)
Addended by: [name] D on: 05/07/2015 12:48 PM   Modules accepted: Orders

## 2015-05-13 ENCOUNTER — Encounter: Payer: Self-pay | Admitting: Family Medicine

## 2015-05-29 DIAGNOSIS — D0462 Carcinoma in situ of skin of left upper limb, including shoulder: Secondary | ICD-10-CM | POA: Diagnosis not present

## 2015-07-31 DIAGNOSIS — Z85828 Personal history of other malignant neoplasm of skin: Secondary | ICD-10-CM | POA: Diagnosis not present

## 2015-07-31 DIAGNOSIS — Z08 Encounter for follow-up examination after completed treatment for malignant neoplasm: Secondary | ICD-10-CM | POA: Diagnosis not present

## 2015-08-23 ENCOUNTER — Other Ambulatory Visit: Payer: Self-pay | Admitting: Family Medicine

## 2015-09-03 ENCOUNTER — Ambulatory Visit (INDEPENDENT_AMBULATORY_CARE_PROVIDER_SITE_OTHER): Payer: Medicare Other | Admitting: Family Medicine

## 2015-09-03 ENCOUNTER — Encounter: Payer: Self-pay | Admitting: Family Medicine

## 2015-09-03 VITALS — BP 129/62 | HR 68 | Wt 201.0 lb

## 2015-09-03 DIAGNOSIS — R350 Frequency of micturition: Secondary | ICD-10-CM

## 2015-09-03 DIAGNOSIS — H9202 Otalgia, left ear: Secondary | ICD-10-CM | POA: Diagnosis not present

## 2015-09-03 DIAGNOSIS — J301 Allergic rhinitis due to pollen: Secondary | ICD-10-CM

## 2015-09-03 LAB — POCT URINALYSIS DIPSTICK
Bilirubin, UA: NEGATIVE
Blood, UA: NEGATIVE
Glucose, UA: NEGATIVE
Ketones, UA: NEGATIVE
Nitrite, UA: NEGATIVE
Protein, UA: NEGATIVE
Spec Grav, UA: 1.015
Urobilinogen, UA: 0.2
pH, UA: 7

## 2015-09-03 NOTE — Patient Instructions (Signed)
Start taking the fluticasone.

## 2015-09-03 NOTE — Progress Notes (Signed)
Subjective:    Patient ID: Dawn Bailey, female    DOB: [birth year], 81 y.o.   MRN: [id number]  HPI Patient comes in today complaining of left ear pain On and off for a couple of weeks. She denies any drainage from the ear. She has been having some mild sinus congestion with allergies. She did take a Benadryl and thought about taking Flonase but never started it. She's not had any drainage from the ear. No fevers chills or sweats. No recent trauma or infection.  She also reports that she had a migraine yesterday.  He also complains of some urinary urgency that started about 2 days ago. She says she'll get that she go and then not actually able to urinate. She says this is typically her first symptom that she gets when she starts to develop a UTI. She denies any dysuria or odor to the urine. No low back pain or pelvic pain with it. No fevers chills or sweats.   Review of Systems  BP 129/62 mmHg  Pulse 68  Wt 201 lb (91.173 kg)  SpO2 95%    Allergies  Allergen Reactions  . Aripiprazole     REACTION: nightmares.  . Cephalexin     REACTION: vomiting  . Lipitor [Atorvastatin] Other (See Comments)    Muscle pain  . Simvastatin Other (See Comments)    Muscle aches/   . Skelaxin [Metaxalone] Other (See Comments)    Cough, SOB  . Sulfonamide Derivatives   . Zanaflex [Tizanidine Hcl]     dizziness    Past Medical History  Diagnosis Date  . Depression   . Allergy   . IBS (irritable bowel syndrome)   . Hearing loss   . Lumbar sprain and strain   . Back sprain/strain, thoracic   . Nevus     atypical  . Gastric ulcer   . Panic attacks   . Osteopenia   . Migraines   . Hypertension   . Hyperlipidemia   . Obesity   . Anemia   . Diverticulosis   . Hemorrhoids     Past Surgical History  Procedure Laterality Date  . Cataract surgery  2004    bilateral    Social History   Social History  . Marital Status: Married    Spouse Name: N/A  . Number of Children: N/A  .  Years of Education: N/A   Occupational History  . Not on file.   Social History Main Topics  . Smoking status: Former Smoker    Quit date: 05/05/1995  . Smokeless tobacco: Never Used  . Alcohol Use: 0.0 oz/week    .5 drink(s) per week     Comment: per week  . Drug Use: No     Comment: Caffiene: 1 cup of tea  . Sexual Activity: No     Comment: married, 2 children, LMP over 10 yrs ago, walks everyday, daily caffeine.   Other Topics Concern  . Not on file   Social History Narrative    Family History  Problem Relation Age of Onset  . Depression Mother   . Diabetes Father   . Hypertension Father   . Stroke Father   . Hypertension Brother     Outpatient Encounter Prescriptions as of 09/03/2015  Medication Sig  . carisoprodol (SOMA) 350 MG tablet take 1 tablet by mouth once daily if needed for muscle spasm and migraines  . clonazePAM (KLONOPIN) 0.5 MG tablet Take 1 tablet (0.5 mg total) by mouth  at bedtime.  . dicyclomine (BENTYL) 20 MG tablet take 1 tablet by mouth every 6 hours  . enalapril (VASOTEC) 5 MG tablet Take 1 tablet by mouth once daily.  . imiquimod (ALDARA) 5 % cream Apply topically at bedtime. X 6 weeks.  Marland Kitchen loperamide (IMODIUM A-D) 2 MG tablet Take 2 mg by mouth as needed.    . metoprolol tartrate (LOPRESSOR) 25 MG tablet take 1 tablet by mouth twice a day  . promethazine (PHENERGAN) 25 MG tablet take 1 tablet by mouth every 6 hours if needed for nausea  . sertraline (ZOLOFT) 100 MG tablet Take 200 mg by mouth daily.  . traZODone (DESYREL) 100 MG tablet Take 100 mg by mouth at bedtime.   No facility-administered encounter medications on file as of 09/03/2015.          Objective:   Physical Exam  Constitutional: She is oriented to person, place, and time. She appears well-developed and well-nourished.  HENT:  Head: Normocephalic and atraumatic.  Right Ear: External ear normal.  Left Ear: External ear normal.  Nose: Nose normal.  Mouth/Throat: Oropharynx  is clear and moist.  Cerumen blocking the left canal. Irrigation performed so that I can evaluate the tympanic membranes for fluid and infection.  Eyes: Conjunctivae and EOM are normal. Pupils are equal, round, and reactive to light.  Neck: Neck supple. No thyromegaly present.  Cardiovascular: Normal rate, regular rhythm and normal heart sounds.   Pulmonary/Chest: Effort normal and breath sounds normal. She has no wheezes.  Lymphadenopathy:    She has no cervical adenopathy.  Neurological: She is alert and oriented to person, place, and time.  Skin: Skin is warm and dry.  Psychiatric: She has a normal mood and affect.          Assessment & Plan:  Otalgia, Left - Ear exam itself does show a little bit of fluid. Recommend start nasal fluticasone. And call if not improving over the next 10-14 days. Call sooner if develops increased pain or fever or drainage from the ear.  Allergic rhinitis-recommend a trial of over-the-counter nasal steroid spray. An incentive Benadryl commend a nonsedating 24-hour acting antihistamine such as Claritin or Allegra.  Urinary urgency- urinalysis is positive for leukocytes but negative for nitrites. We'll go ahead and send for culture. She can call sooner if she feels like she's developing worsening symptoms.

## 2015-09-04 ENCOUNTER — Encounter: Payer: Self-pay | Admitting: Family Medicine

## 2015-09-05 ENCOUNTER — Encounter: Payer: Self-pay | Admitting: Family Medicine

## 2015-09-05 ENCOUNTER — Telehealth: Payer: Self-pay | Admitting: *Deleted

## 2015-09-05 LAB — URINE CULTURE: Colony Count: 30000

## 2015-09-05 MED ORDER — FLUTICASONE PROPIONATE 50 MCG/ACT NA SUSP: 2.0000 | Freq: Every day | NASAL | Status: DC

## 2015-09-05 NOTE — Telephone Encounter (Signed)
Called and lvm informing pt that no Abx were ever sent to her pharmacy and that Dr. Madilyn Fireman recommended that she try fluticasone to help with the ear pain that she was experiencing. I will send script over to her pharmacy for this. Also she could try either claritin or allegra for the allergies.Dawn Bailey

## 2015-09-06 ENCOUNTER — Other Ambulatory Visit: Payer: Self-pay | Admitting: Family Medicine

## 2015-09-08 ENCOUNTER — Encounter: Payer: Self-pay | Admitting: Family Medicine

## 2015-09-16 ENCOUNTER — Telehealth: Payer: Self-pay

## 2015-09-16 MED ORDER — AZITHROMYCIN 250 MG PO TABS: ORAL_TABLET | ORAL | Status: AC

## 2015-09-16 NOTE — Telephone Encounter (Signed)
Patient states she still has sore throat, ear pain and congestion. She was hoping to have an antibiotic sent in. Please advise.

## 2015-09-16 NOTE — Telephone Encounter (Signed)
Patient has been informed. Rhonda Cunningham,CMA  

## 2015-09-16 NOTE — Telephone Encounter (Signed)
Ok, i will send over antibiotic.   [name], MD

## 2015-10-01 ENCOUNTER — Telehealth: Payer: Self-pay | Admitting: Family Medicine

## 2015-10-01 NOTE — Telephone Encounter (Signed)
This message is to inform you that the patient has not yet read the following message. (Notification date: Sep 30, 2015)    RE: Non-Urgent Medical Question    From  Hali Marry, MD   To  LYNNMARIE RAZZANO     Sent  09/16/2015 5:29 PM     Hi Ms. Norman Herrlich,   I sent over an antibiotic this morning. Please let me know if you're not feeling a little better by the end of the week.   Dr. Jerilynn Mages      Previous Messages     ----- Message -----   From: [name]   Sent: 09/08/2015 4:40 PM EDT    To: [name], MD  Subject: Non-Urgent Medical Question   I'm still not better. I am taking the Flonase as instructed but continue to have headache, congestion, sore throat and aches all over. I would welcome any suggestions. [place] User Last Read On  ZANIYLAH BURUCA Not Read

## 2015-12-02 DIAGNOSIS — Z87891 Personal history of nicotine dependence: Secondary | ICD-10-CM | POA: Diagnosis not present

## 2015-12-02 DIAGNOSIS — M5416 Radiculopathy, lumbar region: Secondary | ICD-10-CM | POA: Diagnosis not present

## 2015-12-27 ENCOUNTER — Other Ambulatory Visit: Payer: Self-pay | Admitting: Physician Assistant

## 2016-01-25 ENCOUNTER — Other Ambulatory Visit: Payer: Self-pay | Admitting: Family Medicine

## 2016-02-14 ENCOUNTER — Encounter: Payer: Self-pay | Admitting: Family Medicine

## 2016-02-14 ENCOUNTER — Ambulatory Visit (INDEPENDENT_AMBULATORY_CARE_PROVIDER_SITE_OTHER): Payer: Medicare Other | Admitting: Family Medicine

## 2016-02-14 VITALS — BP 118/56 | HR 82 | Ht 66.0 in | Wt 201.0 lb

## 2016-02-14 DIAGNOSIS — R1032 Left lower quadrant pain: Secondary | ICD-10-CM | POA: Diagnosis not present

## 2016-02-14 DIAGNOSIS — I10 Essential (primary) hypertension: Secondary | ICD-10-CM | POA: Diagnosis not present

## 2016-02-14 DIAGNOSIS — H8111 Benign paroxysmal vertigo, right ear: Secondary | ICD-10-CM | POA: Diagnosis not present

## 2016-02-14 DIAGNOSIS — R42 Dizziness and giddiness: Secondary | ICD-10-CM | POA: Diagnosis not present

## 2016-02-14 LAB — POCT URINALYSIS DIPSTICK
Bilirubin, UA: NEGATIVE
Blood, UA: NEGATIVE
Glucose, UA: NEGATIVE
Ketones, UA: NEGATIVE
Nitrite, UA: NEGATIVE
Protein, UA: NEGATIVE
Spec Grav, UA: 1.01
Urobilinogen, UA: 0.2
pH, UA: 7

## 2016-02-14 MED ORDER — MECLIZINE HCL 25 MG PO TABS: 25.0000 mg | ORAL_TABLET | Freq: Two times a day (BID) | ORAL | 0 refills | Status: DC | PRN

## 2016-02-14 NOTE — Progress Notes (Signed)
Subjective:    CC: HTN  HPI: 81 yo female with hypertension who comes in today for dizziness Had episodes of dizziness that she called EMS for on 02/08/16. She didn't go to the ED or seek other care after that.  They are weaning her down off her klonopin with her psychiatrist. They started this around 02/05/16.  She does complain of dizziness is worse when she lays down.  Hypertension- Pt denies chest pain, SOB, dizziness, or heart palpitations.  Taking meds as directed w/o problems.  Denies medication side effects.    LLQ pain started this AM.  No fever, chills, and sweats.   She has been working with her psychiatrist to wean down her clonazepam. She is now down to half a tab twice a day and is doing well with that.   Past medical history, Surgical history, Family history not pertinant except as noted below, Social history, Allergies, and medications have been entered into the medical record, reviewed, and corrections made.   Review of Systems: No fevers, chills, night sweats, weight loss, chest pain, or shortness of breath.   Objective:    General: Well Developed, well nourished, and in no acute distress.  Neuro: Alert and oriented x3, extra-ocular muscles intact, sensation grossly intact.  HEENT: Normocephalic, atraumatic, Oropharynx is clear, TMs and canals are clear bilaterally. No significant cervical lymphadenopathy.  Skin: Warm and dry, no rashes. Cardiac: Regular rate and rhythm, no murmurs rubs or gallops, no lower extremity edema.  Respiratory: Clear to auscultation bilaterally. Not using accessory muscles, speaking in full sentences. Neuro: Cranial nerves II through XII intact. Positive Dix-Hallpike maneuver to the right. She had vertical nystagmus on exam.   Impression and Recommendations:    HTN - AP is at goal today. It's actually pretty close to where she always runs I don't think she is having hypotensive episodes at least at this point we will keep an eye on  it.   Dizziness  - most consistent with benign positional vertigo. Positive Dix-Hallpike maneuver to the right. Given home exercises to do and will send of her prescription for Antivert.  Left lower quadrant pain-unclear etiology. Consider diverticulitis that she doesn't seem very uncomfortable today and she is afebrile. We'll check a CBC with differential. We did do urinalysis which showed leukocytes but no nitrites or blood.  Generalized anxiety disorder-continuing clonazepam with psychiatry. She is also on sertraline.

## 2016-02-14 NOTE — Addendum Note (Signed)
Addended by: Teddy Spike on: 02/14/2016 02:38 PM   Modules accepted: Orders

## 2016-02-17 DIAGNOSIS — R1032 Left lower quadrant pain: Secondary | ICD-10-CM | POA: Diagnosis not present

## 2016-02-17 DIAGNOSIS — I10 Essential (primary) hypertension: Secondary | ICD-10-CM | POA: Diagnosis not present

## 2016-02-17 LAB — LIPID PANEL
Cholesterol: 251 mg/dL — ABNORMAL HIGH (ref 125–200)
HDL: 71 mg/dL (ref >=46)
LDL Cholesterol: 144 mg/dL — ABNORMAL HIGH (ref <130)
Total CHOL/HDL Ratio: 3.5 Ratio (ref <=5.0)
Triglycerides: 181 mg/dL — ABNORMAL HIGH (ref <150)
VLDL: 36 mg/dL — ABNORMAL HIGH (ref <30)

## 2016-02-17 LAB — COMPLETE METABOLIC PANEL WITH GFR
ALT: 17 U/L (ref 6–29)
AST: 25 U/L (ref 10–35)
Albumin: 4.1 g/dL (ref 3.6–5.1)
Alkaline Phosphatase: 70 U/L (ref 33–130)
BUN: 8 mg/dL (ref 7–25)
CO2: 26 mmol/L (ref 20–31)
Calcium: 9.7 mg/dL (ref 8.6–10.4)
Chloride: 100 mmol/L (ref 98–110)
Creat: 0.73 mg/dL (ref 0.60–0.93)
GFR, Est African American: >89 mL/min (ref >=60)
GFR, Est Non African American: 79 mL/min (ref >=60)
Glucose, Bld: 93 mg/dL (ref 65–99)
Potassium: 4.5 mmol/L (ref 3.5–5.3)
Sodium: 138 mmol/L (ref 135–146)
Total Bilirubin: 0.3 mg/dL (ref 0.2–1.2)
Total Protein: 7.1 g/dL (ref 6.1–8.1)

## 2016-02-17 LAB — CBC WITH DIFFERENTIAL/PLATELET
Basophils Absolute: 0 cells/uL (ref 0–200)
Basophils Relative: 0 %
Eosinophils Absolute: 0 cells/uL — ABNORMAL LOW (ref 15–500)
Eosinophils Relative: 0 %
HCT: 37.8 % (ref 35.0–45.0)
Hemoglobin: 12.1 g/dL (ref 11.7–15.5)
Lymphocytes Relative: 45 %
Lymphs Abs: 2610 cells/uL (ref 850–3900)
MCH: 24.4 pg — ABNORMAL LOW (ref 27.0–33.0)
MCHC: 32 g/dL (ref 32.0–36.0)
MCV: 76.4 fL — ABNORMAL LOW (ref 80.0–100.0)
Monocytes Absolute: 522 cells/uL (ref 200–950)
Monocytes Relative: 9 %
Neutro Abs: 2668 cells/uL (ref 1500–7800)
Neutrophils Relative %: 46 %
Platelets: 186 10*3/uL (ref 140–400)
RBC: 4.95 MIL/uL (ref 3.80–5.10)
RDW: 15.7 % — ABNORMAL HIGH (ref 11.0–15.0)
WBC: 5.8 10*3/uL (ref 3.8–10.8)

## 2016-02-17 LAB — URINE CULTURE

## 2016-02-17 MED ORDER — AMOXICILLIN-POT CLAVULANATE 875-125 MG PO TABS: 1.0000 | ORAL_TABLET | Freq: Two times a day (BID) | ORAL | 0 refills | Status: DC

## 2016-02-17 NOTE — Addendum Note (Signed)
Addended by: [name] D on: 02/17/2016 09:42 AM   Modules accepted: Orders

## 2016-02-26 DIAGNOSIS — M4687 Other specified inflammatory spondylopathies, lumbosacral region: Secondary | ICD-10-CM | POA: Diagnosis not present

## 2016-02-26 DIAGNOSIS — M5416 Radiculopathy, lumbar region: Secondary | ICD-10-CM | POA: Diagnosis not present

## 2016-02-26 DIAGNOSIS — M5136 Other intervertebral disc degeneration, lumbar region: Secondary | ICD-10-CM | POA: Diagnosis not present

## 2016-02-26 DIAGNOSIS — D1809 Hemangioma of other sites: Secondary | ICD-10-CM | POA: Diagnosis not present

## 2016-02-26 DIAGNOSIS — M4686 Other specified inflammatory spondylopathies, lumbar region: Secondary | ICD-10-CM | POA: Diagnosis not present

## 2016-02-26 DIAGNOSIS — M48061 Spinal stenosis, lumbar region without neurogenic claudication: Secondary | ICD-10-CM | POA: Diagnosis not present

## 2016-02-26 DIAGNOSIS — M5126 Other intervertebral disc displacement, lumbar region: Secondary | ICD-10-CM | POA: Diagnosis not present

## 2016-02-26 DIAGNOSIS — M9973 Connective tissue and disc stenosis of intervertebral foramina of lumbar region: Secondary | ICD-10-CM | POA: Diagnosis not present

## 2016-02-26 DIAGNOSIS — M47816 Spondylosis without myelopathy or radiculopathy, lumbar region: Secondary | ICD-10-CM | POA: Diagnosis not present

## 2016-03-08 ENCOUNTER — Other Ambulatory Visit: Payer: Self-pay | Admitting: Family Medicine

## 2016-03-16 ENCOUNTER — Ambulatory Visit (INDEPENDENT_AMBULATORY_CARE_PROVIDER_SITE_OTHER): Payer: Medicare Other | Admitting: Family Medicine

## 2016-03-16 ENCOUNTER — Encounter: Payer: Self-pay | Admitting: Family Medicine

## 2016-03-16 VITALS — BP 142/66 | HR 77 | Wt 201.0 lb

## 2016-03-16 DIAGNOSIS — F4323 Adjustment disorder with mixed anxiety and depressed mood: Secondary | ICD-10-CM | POA: Diagnosis not present

## 2016-03-16 DIAGNOSIS — M5136 Other intervertebral disc degeneration, lumbar region: Secondary | ICD-10-CM

## 2016-03-16 DIAGNOSIS — Z23 Encounter for immunization: Secondary | ICD-10-CM | POA: Diagnosis not present

## 2016-03-16 DIAGNOSIS — N281 Cyst of kidney, acquired: Secondary | ICD-10-CM | POA: Diagnosis not present

## 2016-03-16 MED ORDER — HYDROXYZINE HCL 10 MG PO TABS: 10.0000 mg | ORAL_TABLET | Freq: Three times a day (TID) | ORAL | 3 refills | Status: DC | PRN

## 2016-03-16 NOTE — Progress Notes (Signed)
Subjective:    CC:   HPI:  She wanted to have me look at her MRI of her lumbar spine. She brought me a copy of her report.  Her orthopedist is now recommending spinal injections. She's not sure if it's epidural or facet injections. She was told at one point several years ago that injections would not help her so she is a little confused about whether or not she should have it done. And then she talk to someone who else he said that because of her age she really should not have them done. She's not interested in any type of surgery. Her MRI shows significant degenerative spondylolysis of the lumbar spine most advanced at L4-5 where there is moderate left foraminal narrowing at L4-5 and effacement of the right lateral recess. There is possible impingement at this area. There is also right and central disc extrusion with cranial migration at L3-4. She was also noted to have a large exophytic renal cysts on the left kidney. She has not had any urinary symptoms.  She also wanted to discusse her clonazapem. She has been able to wean herself off but says she still needs something for stress in the evenings. That's when her husband starts to sundown and that time at night becomes very exhausting for her. She says typically by that time of the day she's exhausted anyway because of her increasing back pain more recently. She Artie is on Zoloft 200 mg daily. She is Artie tried Wellbutrin in the past and had side effects.  Past medical history, Surgical history, Family history not pertinant except as noted below, Social history, Allergies, and medications have been entered into the medical record, reviewed, and corrections made.   Review of Systems: No fevers, chills, night sweats, weight loss, chest pain, or shortness of breath.   Objective:    General: Well Developed, well nourished, and in no acute distress.  Neuro: Alert and oriented x3, extra-ocular muscles intact, sensation grossly intact.  HEENT:  Normocephalic, atraumatic  Skin: Warm and dry, no rashes. Cardiac: Regular rate and rhythm, no murmurs rubs or gallops, no lower extremity edema.  Respiratory: Clear to auscultation bilaterally. Not using accessory muscles, speaking in full sentences.   Impression and Recommendations:   Lumbar degenerative disc disease with spondylolysis-I do think she could at least benefit from a trial of the injections. I think it If they can provide her pain relief and help control her pain that would be helpful for her quality of life. I encouraged her to at least consider trying it. Next  Exophytic renal cyst on the left kidney-we'll further delineate with an ultrasound.  Anxiety-currently on Zoloft. Discussed adjusting her controller or changing the medication. We could not add Wellbutrin because she is Artie tried that in the past. We did discuss maybe a trial of hydroxyzine if she is just use it sparingly in place of the clonazepam. Warned about potential for sedation. Start with 10 low-dose of 10 mg.  Flu vaccine given today.

## 2016-03-20 DIAGNOSIS — M5432 Sciatica, left side: Secondary | ICD-10-CM | POA: Diagnosis not present

## 2016-03-20 DIAGNOSIS — M48061 Spinal stenosis, lumbar region without neurogenic claudication: Secondary | ICD-10-CM | POA: Diagnosis not present

## 2016-03-24 ENCOUNTER — Ambulatory Visit (INDEPENDENT_AMBULATORY_CARE_PROVIDER_SITE_OTHER): Payer: Medicare Other | Admitting: Family Medicine

## 2016-03-24 ENCOUNTER — Ambulatory Visit (INDEPENDENT_AMBULATORY_CARE_PROVIDER_SITE_OTHER): Payer: Medicare Other

## 2016-03-24 ENCOUNTER — Encounter: Payer: Self-pay | Admitting: Family Medicine

## 2016-03-24 VITALS — BP 137/71 | HR 83 | Ht 66.0 in | Wt 201.0 lb

## 2016-03-24 DIAGNOSIS — N3000 Acute cystitis without hematuria: Secondary | ICD-10-CM

## 2016-03-24 DIAGNOSIS — N281 Cyst of kidney, acquired: Secondary | ICD-10-CM

## 2016-03-24 LAB — POCT URINALYSIS DIPSTICK
Bilirubin, UA: NEGATIVE
Blood, UA: NEGATIVE
Glucose, UA: NEGATIVE
Ketones, UA: NEGATIVE
Leukocytes, UA: NEGATIVE
Nitrite, UA: POSITIVE
Protein, UA: NEGATIVE
Spec Grav, UA: <=1.005
Urobilinogen, UA: 0.2
pH, UA: 6.5

## 2016-03-24 MED ORDER — CIPROFLOXACIN HCL 500 MG PO TABS: 500.0000 mg | ORAL_TABLET | Freq: Two times a day (BID) | ORAL | 0 refills | Status: AC

## 2016-03-24 NOTE — Addendum Note (Signed)
Addended by: Teddy Spike on: 03/24/2016 02:45 PM   Modules accepted: Orders

## 2016-03-24 NOTE — Progress Notes (Signed)
   Subjective:    Patient ID: Dawn Bailey, female    DOB: 11/23/1934, 81 y.o.   MRN: [id number]  HPI Urinary frequency and urgency since 03/16/16.  No fevers. + chills. No blood in the urine.  Is actually having some pelvic discomfort earlier but thought maybe it was coming from her back because she recently had an injection. Has had some loose stools for a couple of days.     Review of Systems     Objective:   Physical Exam  Constitutional: She is oriented to person, place, and time. She appears well-developed and well-nourished.  HENT:  Head: Normocephalic and atraumatic.  Eyes: Conjunctivae and EOM are normal.  Cardiovascular: Normal rate.   Pulmonary/Chest: Effort normal.  Abdominal: Soft. Bowel sounds are normal. She exhibits no distension and no mass. There is tenderness. There is no rebound and no guarding.  Mildly TTP in the LLQ.    Neurological: She is alert and oriented to person, place, and time.  Skin: Skin is dry. No pallor.  Psychiatric: She has a normal mood and affect. Her behavior is normal.  Vitals reviewed.      Assessment & Plan:  UTI -  Will tx withCipro. Will send culture as she does get these frequently. Try to empty bladder regularly.

## 2016-04-22 ENCOUNTER — Other Ambulatory Visit: Payer: Self-pay | Admitting: Family Medicine

## 2016-04-22 DIAGNOSIS — N281 Cyst of kidney, acquired: Secondary | ICD-10-CM

## 2016-04-23 ENCOUNTER — Other Ambulatory Visit: Payer: Self-pay | Admitting: Family Medicine

## 2016-04-24 ENCOUNTER — Other Ambulatory Visit: Payer: Self-pay

## 2016-04-24 MED ORDER — CARISOPRODOL 350 MG PO TABS: ORAL_TABLET | ORAL | 1 refills | Status: DC

## 2016-05-09 DIAGNOSIS — N281 Cyst of kidney, acquired: Secondary | ICD-10-CM | POA: Diagnosis not present

## 2016-05-09 DIAGNOSIS — K862 Cyst of pancreas: Secondary | ICD-10-CM | POA: Diagnosis not present

## 2016-05-14 ENCOUNTER — Telehealth: Payer: Self-pay | Admitting: *Deleted

## 2016-05-14 NOTE — Telephone Encounter (Signed)
Pt called this morning wanting to know the results of her renal mri done at [facility] this past Saturday.  I could not find anything in care everywhere so I was wondering if you'd received any faxes from them with her results.  Please advise.

## 2016-05-14 NOTE — Telephone Encounter (Signed)
Result Impression    1.  Left renal 5.9 cm simple cyst. 2.  Incidentally identified pancreatic cystic lesion measuring up to 1.4 cm. Recommend clinical correlation for appropriate risk stratification, and consider follow-up MRCP in 2 years for stability assessment. 3.  Ancillary findings as above.    Results of MRI as above. She does have a cyst on the left kidney which is 5.9 cm size but it is a simple cyst. No concerning features.no further workup needed at this time.  She was noted to also have a cyst on the pancreas measuring 1.4 cm in the did recommend repeating an MRI: MRCP in 2 years on the pancreatic cyst just to make sure that it's not changed.  [name], MD

## 2016-05-15 NOTE — Telephone Encounter (Signed)
Pt given results and recommendations. No further questions at this time.Dawn Bailey

## 2016-06-15 ENCOUNTER — Other Ambulatory Visit: Payer: Self-pay | Admitting: Family Medicine

## 2016-06-15 ENCOUNTER — Ambulatory Visit (INDEPENDENT_AMBULATORY_CARE_PROVIDER_SITE_OTHER): Payer: Medicare Other | Admitting: Family Medicine

## 2016-06-15 ENCOUNTER — Encounter: Payer: Self-pay | Admitting: Family Medicine

## 2016-06-15 VITALS — BP 123/68 | HR 77 | Temp 98.7°F | Ht 66.0 in | Wt 195.0 lb

## 2016-06-15 DIAGNOSIS — R6889 Other general symptoms and signs: Secondary | ICD-10-CM

## 2016-06-15 DIAGNOSIS — J018 Other acute sinusitis: Secondary | ICD-10-CM | POA: Diagnosis not present

## 2016-06-15 LAB — POCT INFLUENZA A/B
Influenza A, POC: NEGATIVE
Influenza B, POC: NEGATIVE

## 2016-06-15 MED ORDER — AMOXICILLIN 875 MG PO TABS: 875.0000 mg | ORAL_TABLET | Freq: Two times a day (BID) | ORAL | 0 refills | Status: DC

## 2016-06-15 NOTE — Progress Notes (Signed)
   Subjective:    Patient ID: ODILIA URESTE, female    DOB: [birth year], 82 y.o.   MRN: [id number]  HPI 82 yo female with cough and congestion and nausea x 2 weeks.  She's had a little bit of sore throat just on the right side and some mild right ear pain. She's had severe headache especially this week. She's also had some nausea that she felt was worse this morning. No diarrhea.  Using Tylenol , Benadryl and Pepto bismol.  She has had some chills and sweats especially at night but did not actually measure her temperature.    Review of Systems     Objective:   Physical Exam  Constitutional: She is oriented to person, place, and time. She appears well-developed and well-nourished.  HENT:  Head: Normocephalic and atraumatic.  Right Ear: External ear normal.  Left Ear: External ear normal.  Nose: Nose normal.  Mouth/Throat: Oropharynx is clear and moist.  TMs and canals are clear.   Eyes: Conjunctivae and EOM are normal. Pupils are equal, round, and reactive to light.  Neck: Neck supple. No thyromegaly present.  Cardiovascular: Normal rate, regular rhythm and normal heart sounds.   Pulmonary/Chest: Effort normal and breath sounds normal. She has no wheezes.  Lymphadenopathy:    She has no cervical adenopathy.  Neurological: She is alert and oriented to person, place, and time.  Skin: Skin is warm and dry.  Psychiatric: She has a normal mood and affect. Her behavior is normal.       Assessment & Plan:  Acute sinusitis x 2 weeks.  She actually has a little worse today and nauseated to undergo ahead and treat her with amoxicillin. Call if not significantly better in one week. Okay to continue sent Medicare. Influenza swab was negative.

## 2016-07-01 ENCOUNTER — Encounter: Payer: Self-pay | Admitting: Family Medicine

## 2016-07-02 ENCOUNTER — Ambulatory Visit (INDEPENDENT_AMBULATORY_CARE_PROVIDER_SITE_OTHER): Payer: Medicare Other | Admitting: Family Medicine

## 2016-07-02 VITALS — BP 133/49 | HR 74 | Temp 98.6°F | Wt 196.0 lb

## 2016-07-02 DIAGNOSIS — J0101 Acute recurrent maxillary sinusitis: Secondary | ICD-10-CM | POA: Diagnosis not present

## 2016-07-02 MED ORDER — AZITHROMYCIN 250 MG PO TABS: ORAL_TABLET | ORAL | 0 refills | Status: AC

## 2016-07-02 NOTE — Telephone Encounter (Signed)
Called Pt, appt scheduled for today.

## 2016-07-02 NOTE — Progress Notes (Signed)
Subjective:    CC: ST, cough.   HPI:  35 Your old female comes in today with persistent cough congestion and sore throat. I actually saw her about 2 weeks ago with similar symptoms including nausea and treated her for acute sinusitis with amoxicillin . She said she did gradually get better but then after she finished the antibiotic she gradually started to get worse again and all the same symptoms her back including headache and right-sided maxillary facial pain. She's also been getting some blood and thick discharge out of the right nostril. No other worsening or alleviating factors. He has recently started using her Flonase.     Objective:    General: Well Developed, well nourished, and in no acute distress.  Neuro: Alert and oriented x3, extra-ocular muscles intact, sensation grossly intact.  HEENT: Normocephalic, atraumatic, Or fixes clear, TMs and canals are clear bilaterally. Right nostril has a thick yellow crust as well as some fresh blood. No significant cervical lymphadenopathy.  Skin: Warm and dry, no rashes. Cardiac: Regular rate and rhythm, no murmurs rubs or gallops, no lower extremity edema.  Respiratory: Clear to auscultation bilaterally. Not using accessory muscles, speaking in full sentences.   Impression and Recommendations:   Right maxillary sinusitis-we'll switch to azithromycin for treatment. If not better in 5 days and please give [place] a call back. Make sure hydrating well. Also recommend a trial of nasal saline to Irrigate the sinuses and okay to continue with Flonase.

## 2016-07-08 ENCOUNTER — Other Ambulatory Visit: Payer: Self-pay | Admitting: Family Medicine

## 2016-07-08 DIAGNOSIS — Z23 Encounter for immunization: Secondary | ICD-10-CM

## 2016-07-19 ENCOUNTER — Encounter: Payer: Self-pay | Admitting: Family Medicine

## 2016-07-20 MED ORDER — CLARITHROMYCIN 250 MG PO TABS: 250.0000 mg | ORAL_TABLET | Freq: Two times a day (BID) | ORAL | 0 refills | Status: DC

## 2016-07-30 ENCOUNTER — Ambulatory Visit (INDEPENDENT_AMBULATORY_CARE_PROVIDER_SITE_OTHER): Payer: Medicare Other | Admitting: Family Medicine

## 2016-07-30 ENCOUNTER — Encounter: Payer: Self-pay | Admitting: Family Medicine

## 2016-07-30 ENCOUNTER — Ambulatory Visit (INDEPENDENT_AMBULATORY_CARE_PROVIDER_SITE_OTHER): Payer: Medicare Other

## 2016-07-30 VITALS — BP 132/57 | HR 80 | Ht 66.0 in | Wt 199.0 lb

## 2016-07-30 DIAGNOSIS — M545 Low back pain, unspecified: Secondary | ICD-10-CM

## 2016-07-30 DIAGNOSIS — M4807 Spinal stenosis, lumbosacral region: Secondary | ICD-10-CM | POA: Diagnosis not present

## 2016-07-30 DIAGNOSIS — M48061 Spinal stenosis, lumbar region without neurogenic claudication: Secondary | ICD-10-CM | POA: Diagnosis not present

## 2016-07-30 MED ORDER — KETOROLAC TROMETHAMINE 60 MG/2ML IM SOLN
60.0000 mg | Freq: Once | INTRAMUSCULAR | Status: AC
  Administered 2016-07-30: 60 mg via INTRAMUSCULAR

## 2016-07-30 MED ORDER — CYCLOBENZAPRINE HCL 10 MG PO TABS: 10.0000 mg | ORAL_TABLET | Freq: Three times a day (TID) | ORAL | 0 refills | Status: DC | PRN

## 2016-07-30 NOTE — Addendum Note (Signed)
Addended by: Teddy Spike on: 07/30/2016 11:38 AM   Modules accepted: Orders

## 2016-07-30 NOTE — Progress Notes (Signed)
Subjective:    Patient ID: Dawn Bailey, female    DOB: Mar 22, 1935, 82 y.o.   MRN: [id number]  HPI Right low back pain started about a month ago when she tried to catch a falling vacuum cleaner. Says it radiates around to the Right lower abdomen. Starts around a 4/10 in AM and then by night is is a 9/10. Has been using ice.  Helps some.  Using soma and not really helping. Describes the pain as sharp at times. She cannot take NSAIDs because of prior history gastric ulcer.   Review of Systems   BP (!) 132/57   Pulse 80   Ht 5\' 6"  (1.676 m)   Wt 199 lb (90.3 kg)   SpO2 98%   BMI 32.12 kg/m     Allergies  Allergen Reactions  . Aripiprazole     REACTION: nightmares.  . Cephalexin     REACTION: vomiting  . Lipitor [Atorvastatin] Other (See Comments)    Muscle pain  . Nsaids Other (See Comments)    Bleeding ulcer  . Simvastatin Other (See Comments)    Muscle aches/   . Skelaxin [Metaxalone] Other (See Comments)    Cough, SOB  . Sulfonamide Derivatives   . Zanaflex [Tizanidine Hcl]     dizziness    Past Medical History:  Diagnosis Date  . Allergy   . Anemia   . Back sprain/strain, thoracic   . Depression   . Diverticulosis   . Gastric ulcer   . Hearing loss   . Hemorrhoids   . Hyperlipidemia   . Hypertension   . IBS (irritable bowel syndrome)   . Lumbar sprain and strain   . Migraines   . Nevus    atypical  . Obesity   . Osteopenia   . Panic attacks     Past Surgical History:  Procedure Laterality Date  . cataract surgery  2004   bilateral    Social History   Social History  . Marital status: Married    Spouse name: N/A  . Number of children: N/A  . Years of education: N/A   Occupational History  . Not on file.   Social History Main Topics  . Smoking status: Former Smoker    Quit date: 05/05/1995  . Smokeless tobacco: Never Used  . Alcohol use 0.0 oz/week    1 drink(s) per week     Comment: per week  . Drug use: No     Comment: Caffiene:  1 cup of tea  . Sexual activity: No     Comment: married, 2 children, LMP over 10 yrs ago, walks everyday, daily caffeine.   Other Topics Concern  . Not on file   Social History Narrative  . No narrative on file    Family History  Problem Relation Age of Onset  . Depression Mother   . Diabetes Father   . Hypertension Father   . Stroke Father   . Hypertension Brother     Outpatient Encounter Prescriptions as of 07/30/2016  Medication Sig  . carisoprodol (SOMA) 350 MG tablet take 1 tablet by mouth once daily if needed for muscle spasm and migraines  . dicyclomine (BENTYL) 20 MG tablet take 1 tablet by mouth every 6 hours  . enalapril (VASOTEC) 5 MG tablet take 1 tablet by mouth once daily  . fluticasone (FLONASE) 50 MCG/ACT nasal spray Place 2 sprays into both nostrils daily.  . hydrOXYzine (ATARAX/VISTARIL) 10 MG tablet take 1 tablet by mouth  three times a day if needed  . imiquimod (ALDARA) 5 % cream Apply topically at bedtime. X 6 weeks.  Marland Kitchen loperamide (IMODIUM A-D) 2 MG tablet Take 2 mg by mouth as needed.    . metoprolol tartrate (LOPRESSOR) 25 MG tablet Take 1 tablet (25 mg total) by mouth 2 (two) times daily. Needs f/u appt specifically for blood pressure  . promethazine (PHENERGAN) 25 MG tablet take 1 tablet by mouth every 6 hours if needed for nausea  . sertraline (ZOLOFT) 100 MG tablet Take 200 mg by mouth daily.  . traZODone (DESYREL) 100 MG tablet Take 100 mg by mouth at bedtime.  . cyclobenzaprine (FLEXERIL) 10 MG tablet Take 1 tablet (10 mg total) by mouth 3 (three) times daily as needed for muscle spasms.  . [DISCONTINUED] amoxicillin (AMOXIL) 875 MG tablet Take 1 tablet (875 mg total) by mouth 2 (two) times daily.  . [DISCONTINUED] clarithromycin (BIAXIN) 250 MG tablet Take 1 tablet (250 mg total) by mouth 2 (two) times daily.  . [DISCONTINUED] meclizine (ANTIVERT) 25 MG tablet Take 1 tablet (25 mg total) by mouth 2 (two) times daily as needed for dizziness.   No  facility-administered encounter medications on file as of 07/30/2016.          Objective:   Physical Exam  Constitutional: She is oriented to person, place, and time. She appears well-developed and well-nourished.  HENT:  Head: Normocephalic and atraumatic.  Eyes: Conjunctivae and EOM are normal.  Cardiovascular: Normal rate.   Pulmonary/Chest: Effort normal.  Musculoskeletal:  Lumbar spine with decreased flexion. She is actually only able to flex to about 30 if that. Normal extension. Decreased rotation right compared to left. And decreased side bending to the right compared to the left. Tender over the lower lumbar spine and over the sacrum. Nontender over the right SI joint. Tender over the right paraspinous muscles as well. Negative straight leg raise bilaterally. Hip, knee, strength is 5 out of 5 bilaterally. 1+ patellar reflexes bilaterally.  Neurological: She is alert and oriented to person, place, and time.  Skin: Skin is dry. No pallor.  Psychiatric: She has a normal mood and affect. Her behavior is normal.  Vitals reviewed.       Assessment & Plan:  Acute low back pain radiating to the right side and around to the right abdomen. Will give Toradol injection today for acute relief. We can also try Flexeril she says that has worked well for her in the past especially in the evenings. Also will get x-ray for further evaluation to rule out vertebral fracture versus probably degenerative disc with herniation.

## 2016-08-19 ENCOUNTER — Other Ambulatory Visit: Payer: Self-pay | Admitting: Osteopathic Medicine

## 2016-08-27 ENCOUNTER — Ambulatory Visit: Payer: Medicare Other | Admitting: Family Medicine

## 2016-09-04 ENCOUNTER — Ambulatory Visit (INDEPENDENT_AMBULATORY_CARE_PROVIDER_SITE_OTHER): Payer: Medicare Other | Admitting: Family Medicine

## 2016-09-04 ENCOUNTER — Encounter: Payer: Self-pay | Admitting: Family Medicine

## 2016-09-04 VITALS — BP 139/76 | HR 101 | Ht 66.0 in | Wt 194.0 lb

## 2016-09-04 DIAGNOSIS — I10 Essential (primary) hypertension: Secondary | ICD-10-CM | POA: Diagnosis not present

## 2016-09-04 DIAGNOSIS — M5441 Lumbago with sciatica, right side: Secondary | ICD-10-CM

## 2016-09-04 MED ORDER — CARISOPRODOL 350 MG PO TABS: 350.0000 mg | ORAL_TABLET | Freq: Three times a day (TID) | ORAL | 1 refills | Status: DC | PRN

## 2016-09-04 MED ORDER — PREDNISONE 20 MG PO TABS: 40.0000 mg | ORAL_TABLET | Freq: Every day | ORAL | 0 refills | Status: DC

## 2016-09-04 NOTE — Progress Notes (Signed)
Subjective:    CC: HTN  HPI:  Says her right low back has been worse since when she was here 2 weeks ago.  Her husband had a stroke since then and she has been having to do a lot more around the house. She has been using soma BID and using flexeril. She feels like it's actually been getting a little bit worse. She's now getting some radicular symptoms down into her right hip and outer thigh area. Doesn't usually cross her knee.  Hypertension- Pt denies chest pain, SOB, dizziness, or heart palpitations.  Taking meds as directed w/o problems.  Denies medication side effects.     Past medical history, Surgical history, Family history not pertinant except as noted below, Social history, Allergies, and medications have been entered into the medical record, reviewed, and corrections made.   Review of Systems: No fevers, chills, night sweats, weight loss, chest pain, or shortness of breath.   Objective:    General: Well Developed, well nourished, and in no acute distress.  Neuro: Alert and oriented x3, extra-ocular muscles intact, sensation grossly intact.  HEENT: Normocephalic, atraumatic  Skin: Warm and dry, no rashes. Cardiac: Regular rate and rhythm, no murmurs rubs or gallops, no lower extremity edema.  Respiratory: Clear to auscultation bilaterally. Not using accessory muscles, speaking in full sentences.   Impression and Recommendations:   Right low back pain - Since she's not improving recommend starting some home stretching and referral to physical therapy. She doesn't think she'll be able to go for about 2 weeks because of other doctors appointments and her husband help. I think that's perfectly fine but she can restart some stretches at home. Also described the cat and camel stretch.  Start 5 days of prednisone. Discontinue Flexeril and will just use soma. Normally she just takes it when necessary for headaches but this point were to bump her up to 3 times a day for the muscle  relaxer.   HTN - Well controlled. Continue current regimen. Follow up in  6 months.

## 2016-09-08 ENCOUNTER — Other Ambulatory Visit: Payer: Self-pay | Admitting: Family Medicine

## 2016-09-17 ENCOUNTER — Ambulatory Visit (INDEPENDENT_AMBULATORY_CARE_PROVIDER_SITE_OTHER): Payer: Medicare Other | Admitting: Rehabilitative and Restorative Service Providers"

## 2016-09-17 ENCOUNTER — Encounter: Payer: Self-pay | Admitting: Rehabilitative and Restorative Service Providers"

## 2016-09-17 DIAGNOSIS — M6281 Muscle weakness (generalized): Secondary | ICD-10-CM

## 2016-09-17 DIAGNOSIS — R29898 Other symptoms and signs involving the musculoskeletal system: Secondary | ICD-10-CM

## 2016-09-17 DIAGNOSIS — M545 Low back pain, unspecified: Secondary | ICD-10-CM

## 2016-09-17 NOTE — Patient Instructions (Signed)
Abdominal Bracing With Pelvic Floor (Hook-Lying)    With neutral spine, tighten pelvic floor and abdominals sucking belly button to back bone; tighten muscles in LB at waist; hold 5 sec Exhale for 5 count. Repeat _10__ times. Do _several__ times a day. Progress to sitting standing and functional activities.   Trunk Extension    Standing, place back of open hands on low back. Straighten spine then arch the back and move shoulders back. Repeat _2-3___ times per session. Do _several___ sessions peday  Trunk: Prone Extension (Press-Ups)    Lie on stomach on firm, flat surface. Relax bottom and legs. Raise chest in air with elbows straight. Keep hips flat on surface, sag stomach. Hold _1-2___ seconds. Repeat __10__ times. Do _2___ sessions per day. CAUTION: Movement should be gentle and slow.   HIP: Hamstrings - Supine   Place strap around foot. Raise leg up, keeping knee straight.  Bend opposite knee to protect back if indicated. Hold 30 seconds. 3 reps per set, 2-3 sets per day   Piriformis Stretch   Lying on back, pull right knee toward opposite shoulder. Hold 30 seconds. Repeat 3 times. Do 2-3 sessions per day.   TENS UNIT: This is helpful for muscle pain and spasm.   Search and Purchase a TENS 7000 2nd edition at www.tenspros.com. It should be less than $30.     TENS unit instructions: Do not shower or bathe with the unit on Turn the unit off before removing electrodes or batteries If the electrodes lose stickiness add a drop of water to the electrodes after they are disconnected from the unit and place on plastic sheet. If you continued to have difficulty, call the TENS unit company to purchase more electrodes. Do not apply lotion on the skin area prior to use. Make sure the skin is clean and dry as this will help prolong the life of the electrodes. After use, always check skin for unusual red areas, rash or other skin difficulties. If there are any skin problems,  does not apply electrodes to the same area. Never remove the electrodes from the unit by pulling the wires. Do not use the TENS unit or electrodes other than as directed. Do not change electrode placement without consultating your therapist or physician. Keep 2 fingers with between each electrode.

## 2016-09-17 NOTE — Therapy (Addendum)
[facility] [place], [place], [address] Phone: [phone]   Fax:  [phone]  Physical Therapy Evaluation  Patient Details  Name: Dawn Bailey MRN: [id number] Date of Birth: [birth year] Referring Provider: Dr [name]  Encounter Date: 09/17/2016      PT End of Session - 09/17/16 1107    Visit Number 1   Number of Visits 12   Date for PT Re-Evaluation 10/29/16   PT Start Time 1017   PT Stop Time 1120   PT Time Calculation (min) 63 min   Activity Tolerance Patient tolerated treatment well      Past Medical History:  Diagnosis Date  . Allergy   . Anemia   . Back sprain/strain, thoracic   . Depression   . Diverticulosis   . Gastric ulcer   . Hearing loss   . Hemorrhoids   . Hyperlipidemia   . Hypertension   . IBS (irritable bowel syndrome)   . Lumbar sprain and strain   . Migraines   . Nevus    atypical  . Obesity   . Osteopenia   . Panic attacks     Past Surgical History:  Procedure Laterality Date  . cataract surgery  2004   bilateral    There were no vitals filed for this visit.       Subjective Assessment - 09/17/16 1023    Subjective Patient reports that she has had LBP since she was 82 years old. She has episodic flare up of LBP over the years which is worse with lifting and incresaed physical activity. She has had injections in the past but improvement is only for a short time. She is better when active. She was catching a vacuum that was falling 2/18 when she moved quickly and had pain whch has persisted.  Symptoms have continued.    Pertinent History HTN; arthritis; IBS; chronic intermittent LBP    How long can you sit comfortably? 20 min    How long can you stand comfortably? 5-10 min    How long can you walk comfortably? 5-10 min   Diagnostic tests xrays    Patient Stated Goals relief the pain    Currently in Pain? Yes   Pain Score 3    Pain Location Back   Pain Orientation Right   Pain Descriptors / Indicators Throbbing   Pain Type Chronic pain;Acute pain   Pain Radiating Towards into posterior and anterior hip down into the lateral thigh    Pain Onset More than a month ago   Pain Frequency Intermittent   Aggravating Factors  lifting; physical activity; twisting; bending    Pain Relieving Factors lying on back; ice; meds            [facility] PT Assessment - 09/17/16 0001      Assessment   Medical Diagnosis Rt sided LBP    Referring Provider Dr [name]   Onset Date/Surgical Date 06/18/16   Hand Dominance Right   Next MD Visit 5/18   Prior Therapy yes - many times - most recently several years ago      Precautions   Precautions None     Balance Screen   Has the patient fallen in the past 6 months No   Has the patient had a decrease in activity level because of a fear of falling?  No   Is the patient reluctant to leave their home because of a fear of falling?  No  Home Environment   Additional Comments multilevel lives on first floor - 2 steps to enter - no difficulty entering home      Prior Function   Level of Independence Independent   Vocation Retired   [employer] retired ~ 2009    Leisure household chores; gardening; packing and cleaning to put home on the market for sale; reading     Observation/Other Assessments   Focus on Therapeutic Outcomes (FOTO)  60% limitation      Sensation   Additional Comments Rt lower leg into ankle area when symptoms are at their worst      Posture/Postural Control   Posture Comments head forward; shoudlers rounded; flexed forward at hips; weight shifted to Lt/trunk to Lt      AROM   Lumbar Flexion 40%  Rt LBP   Lumbar Extension 40%  feels good    Lumbar - Right Side Bend 50%  pain Rt LB and hip    Lumbar - Left Side Bend 60%  pull Rt LB and hip    Lumbar - Right Rotation 30%  Rt LBP    Lumbar - Left Rotation 40%  no pain      Strength   Overall  Strength Comments 5/5 bilat LE's except as noted    Right Hip Flexion 4+/5   Right Hip Extension 4/5   Right Hip ABduction 3/5   Left Hip Extension --  5-/5   Left Hip ABduction 4+/5     Flexibility   Hamstrings tight bilat ~65 deg   difficulty maintaining full knee extension    Quadriceps tight bilat    ITB tight bilat    Piriformis tight Rt > Lt      Palpation   Spinal mobility hypomobile lumbar spine with CPA mobs    Palpation comment muscular tightness Rt QL/lats/lumbar paraspinals; piriformis; glut med/min      Special Tests    Special Tests --  (-) SLR/Fabers      Transfers   Comments difficulty with moving in and out of supine as well as turning to side and prone                    [facility] Adult PT Treatment/Exercise - 09/17/16 0001      Lumbar Exercises: Stretches   Passive Hamstring Stretch 3 reps;30 seconds   Standing Extension 2 reps  1-2 sec    Press Ups --  1-2 sec hold x 10    Piriformis Stretch 3 reps;30 seconds  travell      Lumbar Exercises: Supine   Ab Set --  4 part core 10 sec x 10      Cryotherapy   Number Minutes Cryotherapy 20 Minutes   Cryotherapy Location Lumbar Spine;Hip   Type of Cryotherapy Ice pack     Electrical Stimulation   Electrical Stimulation Location Rt lumbar to hipi   Electrical Stimulation Action IFC   Electrical Stimulation Parameters to tolerance   Electrical Stimulation Goals Pain;Tone                PT Education - 09/17/16 1055    Education provided Yes   Education Details HEP; TENS    Person(s) Educated Patient   Methods Explanation;Demonstration;Tactile cues;Verbal cues;Handout   Comprehension Verbalized understanding;Returned demonstration;Verbal cues required;Tactile cues required             PT Long Term Goals - 09/17/16 1020      PT LONG TERM GOAL #1  Title Decrease muscular tightness through the Rt lumbar and hip musculature to decrease pain and improve function 10/29/16   Time  6   Period Weeks   Status New     PT LONG TERM GOAL #2   Title Increase strength Rt LE to 5-/5 to 5/5 in all planes 10/29/16   Time 6   Period Weeks   Status New     PT LONG TERM GOAL #3   Title Increase functional standing and walking tolerance to 15-20 min with minimal to no pain 10/29/16   Time 6   Period Weeks   Status New     PT LONG TERM GOAL #4   Title Independent in HEP 10/29/16   Time 6   Period Weeks   Status New     PT LONG TERM GOAL #5   Title Improve FOTO to </= 50% limitation 10/29/16   Time 6   Period Weeks   Status New               Plan - 09/17/16 1110    Clinical Impression Statement Jaquita presents for low complexity evaluation of acute flare up of chronic recurrent LBP primarily into the Rt side and posterior hip. Shr presents with poor osture and alignment; limited trunk and LE ROM/mobility; decresed LE strength; poor core strength; limited functional ability and tolerance; pain on a daily basis. Patient will benefit from PT to address problems identified.     Rehab Potential Good   PT Frequency 2x / week   PT Duration 6 weeks   PT Treatment/Interventions Patient/family education;Neuromuscular re-education;ADLs/Self Care Home Management;Cryotherapy;Electrical Stimulation;Iontophoresis 47m/ml Dexamethasone;Moist Heat;Ultrasound;Dry needling;Manual techniques;Therapeutic activities;Therapeutic exercise   PT Next Visit Plan review HEP; stretch psoas in sitting; manual work Rt LB/hip; core stabilization; modalities; back education    Consulted and Agree with Plan of Care Patient      Patient will benefit from skilled therapeutic intervention in order to improve the following deficits and impairments:  Postural dysfunction, Improper body mechanics, Pain, Decreased range of motion, Decreased mobility, Decreased strength, Increased fascial restricitons, Increased muscle spasms, Decreased activity tolerance  Visit Diagnosis: Acute right-sided low back pain  without sciatica - Plan: PT plan of care cert/re-cert  Other symptoms and signs involving the musculoskeletal system - Plan: PT plan of care cert/re-cert  Muscle weakness (generalized) - Plan: PT plan of care cert/re-cert     Problem List Patient Active Problem List   Diagnosis Date Noted  . Insomnia 05/08/2014  . Overweight(278.02) 06/15/2011  . Major depressive disorder, recurrent episode, moderate (HCC) 05/06/2011    Class: Chronic  . Tremor 01/16/2011  . Chronic diarrhea 02/19/2010  . UNSPECIFIED HEARING LOSS 07/19/2009  . Lumbar degenerative disc disease 11/28/2008  . RHINITIS, ALLERGIC NOS 09/23/2006  . HYPERCHOLESTEROLEMIA 02/09/2006  . Posttraumatic stress disorder 02/09/2006  . Migraine 02/09/2006  . HYPERTENSION, BENIGN SYSTEMIC 02/09/2006  . OSTEOPENIA 02/09/2006    Celyn PNilda SimmerPT, MPH  09/17/2016, [address] [place] [address]Phone: [phone]  Fax:  [phone] Name: DKARYS MECKLEYMRN: [id number] of Birth: [birth year]  PHYSICAL THERAPY DISCHARGE SUMMARY  Visits from Start of Care: Evaluation only  Current functional level related to goals / functional outcomes: unchanged   Remaining deficits: unchanged   Education / Equipment: Initial HEP  Plan: Patient agrees to discharge.  Patient goals were not met. Patient is being discharged due to not returning  since the last visit.  ?????     Lanitra Battaglini P. Helene Kelp PT, MPH 11/17/16 10:37 AM

## 2016-09-25 ENCOUNTER — Ambulatory Visit (INDEPENDENT_AMBULATORY_CARE_PROVIDER_SITE_OTHER): Payer: Medicare Other | Admitting: Family Medicine

## 2016-09-25 ENCOUNTER — Encounter: Payer: Self-pay | Admitting: Family Medicine

## 2016-09-25 DIAGNOSIS — M5441 Lumbago with sciatica, right side: Secondary | ICD-10-CM

## 2016-09-25 MED ORDER — PREDNISONE 20 MG PO TABS: 40.0000 mg | ORAL_TABLET | Freq: Every day | ORAL | 0 refills | Status: DC

## 2016-09-25 NOTE — Progress Notes (Signed)
   Subjective:    Patient ID: Dawn Bailey, female    DOB: 05-11-1934, 82 y.o.   MRN: [id number]  HPI  3 week follow-up for right-sided low back pain-resolved couple weeks ago her pain is beginning progressively worse. She was doing a lot more housework as her husband recently had a stroke. She is trying to put house up for sale and says she's been doing a lot of boxing things up to try to the house ready for the market. This is really aggravated her back. She said she actually spotted really well to the prednisone that I gave her indigo for one session of physical therapy. She did find that helpful. She has ordered a TENS unit. She says icing does seem to help. She also reports that her back is aggravated when she's been sitting for long periods time. Unfortunately this happens when she consider hospice that her husband at the nursing home and has to sit with him 4 hours.  Review of Systems     Objective:   Physical Exam  Constitutional: She is oriented to person, place, and time. She appears well-developed and well-nourished.  HENT:  Head: Normocephalic and atraumatic.  Eyes: Conjunctivae and EOM are normal.  Cardiovascular: Normal rate.   Pulmonary/Chest: Effort normal.  Musculoskeletal:  Normal flexion of the lumbar spine.  Normal extension. Sig dec rotation of the to the right. Nontender over the SI joints. Nontender over the lumbar spine or paraspinous muscles today. Negative straight leg raise bilaterally.  Neurological: She is alert and oriented to person, place, and time.  Patellar reflexes 1+ bilaterally.  Skin: Skin is dry. No pallor.  Psychiatric: She has a normal mood and affect. Her behavior is normal.  Vitals reviewed.         Assessment & Plan:  Right sided low back pain with radicular sxs - Will refill her prednisone again. Reminded her to stop immediately if any GI upset or irritation. Make sure to take with food and water. Encouraged her to get back into physical  therapy and continued to do her sessions even though she still doing a lot of work around the house. I don't want her to wait until she finishes that because PT can be very helpful to get her through this. Without having to use more medication. Also encouraged her to take her TENS unit with her to physical therapy so that they can help her learn how to apply it and she can get comfortable with using it.

## 2016-09-25 NOTE — Patient Instructions (Signed)
Encouraged you to get back into Physical Therapy.

## 2016-10-27 ENCOUNTER — Ambulatory Visit (INDEPENDENT_AMBULATORY_CARE_PROVIDER_SITE_OTHER): Payer: Medicare Other | Admitting: Family Medicine

## 2016-10-27 ENCOUNTER — Encounter: Payer: Self-pay | Admitting: Family Medicine

## 2016-10-27 VITALS — BP 126/63 | HR 94 | Temp 97.8°F | Ht 66.0 in | Wt 191.0 lb

## 2016-10-27 DIAGNOSIS — W57XXXA Bitten or stung by nonvenomous insect and other nonvenomous arthropods, initial encounter: Secondary | ICD-10-CM | POA: Diagnosis not present

## 2016-10-27 DIAGNOSIS — L304 Erythema intertrigo: Secondary | ICD-10-CM

## 2016-10-27 MED ORDER — DOXYCYCLINE HYCLATE 100 MG PO TABS: 100.0000 mg | ORAL_TABLET | Freq: Two times a day (BID) | ORAL | 0 refills | Status: DC

## 2016-10-27 MED ORDER — TRIAMCINOLONE ACETONIDE 0.5 % EX CREA: 1.0000 "application " | TOPICAL_CREAM | Freq: Two times a day (BID) | CUTANEOUS | 3 refills | Status: DC

## 2016-10-27 MED ORDER — NYSTATIN 100000 UNIT/GM EX CREA: 1.0000 "application " | TOPICAL_CREAM | Freq: Two times a day (BID) | CUTANEOUS | 0 refills | Status: DC

## 2016-10-27 NOTE — Patient Instructions (Signed)
Thank you for coming in today. Take doxycycline twice daily for 1 week.  Use the creams for the rash under your breasts.  Recheck with me as needed.  Follow up with Dr Madilyn Fireman if not better.    Tick Bite Information, Adult Ticks are insects that draw blood for food. Most ticks live in shrubs and grassy areas. They climb onto people and animals that brush against the leaves and grasses that they rest on. Then they bite, attaching themselves to the skin. Most ticks are harmless, but some ticks carry germs that can spread to a person through a bite and cause a disease. To reduce your risk of getting a disease from a tick bite, it is important to take steps to prevent tick bites. It is also important to check for ticks after being outdoors. If you find that a tick has attached to you, watch for symptoms of disease. How can I prevent tick bites? Take these steps to help prevent tick bites when you are outdoors in an area where ticks are found:  Use insect repellent that has DEET (20% or higher), picaridin, or IR3535 in it. Use it on: ? Skin that is showing. ? The top of your boots. ? Your pant legs. ? Your sleeve cuffs.  For repellent products that contain permethrin, follow product instructions. Use these products on: ? Clothing. ? Gear. ? Boots. ? Tents.  Wear protective clothing. Long sleeves and long pants offer the best protection from ticks.  Wear light-colored clothing so you can see ticks more easily.  Tuck your pant legs into your socks.  If you go walking on a trail, stay in the middle of the trail so your skin, hair, and clothing do not touch the bushes.  Avoid walking through areas with long grass.  Check for ticks on your clothing, hair, and skin often while you are outside, and check again before you go inside. Make sure to check the places that ticks attach themselves most often. These places include the scalp, neck, armpits, waist, groin, and joint areas. Ticks that  carry a disease called Lyme disease have to be attached to the skin for 24-48 hours. Checking for ticks every day will lessen your risk of this and other diseases.  When you come indoors, wash your clothes and take a shower or a bath right away. Dry your clothes in a dryer on high heat for at least 60 minutes. This will kill any ticks in your clothes.  What is the proper way to remove a tick? If you find a tick on your body, remove it as soon as possible. Removing a tick sooner rather than later can prevent germs from passing from the tick to your body. To remove a tick that is crawling on your skin but has not bitten:  Go outdoors and brush the tick off.  Remove the tick with tape or a lint roller.  To remove a tick that is attached to your skin:  Wash your hands.  If you have latex gloves, put them on.  Use tweezers, curved forceps, or a tick-removal tool to gently grasp the tick as close to your skin and the tick's head as possible.  Gently pull with steady, upward pressure until the tick lets go. When removing the tick: ? Take care to keep the tick's head attached to its body. ? Do not twist or jerk the tick. This can make the tick's head or mouth break off. ? Do not squeeze or  crush the tick's body. This could force disease-carrying fluids from the tick into your body.  Do not try to remove a tick with heat, alcohol, petroleum jelly, or fingernail polish. Using these methods can cause the tick to salivate and regurgitate into your bloodstream, increasing your risk of getting a disease. What should I do after removing a tick?  Clean the bite area with soap and water, rubbing alcohol, or an iodine scrub.  If an antiseptic cream or ointment is available, apply a small amount to the bite site.  Wash and disinfect any instruments that you used to remove the tick. How should I dispose of a tick? To dispose of a live tick, use one of these methods:  Place it in rubbing  alcohol.  Place it in a sealed bag or container.  Wrap it tightly in tape.  Flush it down the toilet.  Contact a health care provider if:  You have symptoms of a disease after a tick bite. Symptoms of a tick-borne disease can occur from moments after the tick bites to up to 30 days after a tick is removed. Symptoms include: ? Muscle, joint, or bone pain. ? Difficulty walking or moving your legs. ? Numbness in the legs. ? Paralysis. ? Red rash around the tick bite area that is shaped like a target or a "bull's-eye." ? Redness and swelling in the area of the tick bite. ? Fever. ? Repeated vomiting. ? Diarrhea. ? Weight loss. ? Tender, swollen lymph glands. ? Shortness of breath. ? Cough. ? Pain in the abdomen. ? Headache. ? Abnormal tiredness. ? A change in your level of consciousness. ? Confusion. Get help right away if:  You are not able to remove a tick.  A part of a tick breaks off and gets stuck in your skin.  Your symptoms get worse. Summary  Ticks may carry germs that can spread to a person through a bite and cause disease.  Wear protective clothing and use insect repellent to prevent tick bites. Follow product instructions.  If you find a tick on your body, remove it as soon as possible. If the tick is attached, do not try to remove with heat, alcohol, petroleum jelly, or fingernail polish.  Remove the attached tick using tweezers, curved forceps, or a tick-removal tool. Gently pull with steady, upward pressure until the tick lets go. Do not twist or jerk the tick. Do not squeeze or crush the tick's body.  If you have symptoms after being bitten by a tick, contact a health care provider. This information is not intended to replace advice given to you by your health care provider. Make sure you discuss any questions you have with your health care provider. Document Released: 04/17/2000 Document Revised: 01/31/2016 Document Reviewed: 01/31/2016 Elsevier  Interactive Patient Education  [employer].

## 2016-10-27 NOTE — Progress Notes (Signed)
Dawn Bailey is a 82 y.o. female who presents to [facility] today for tick bite. Patient was bitten by a tick on her right thigh last Thursday. She notes a small red bump with a tick was removed. She's not sure how long the tick was on but it was not engorged. A few days later she developed night sweats and chills. She notes a rash under her breasts as well that it's mildly itchy. She feels well otherwise. She denies any other rash body aches or headaches. She notes chronic nausea but is not new for her.   Past Medical History:  Diagnosis Date  . Allergy   . Anemia   . Back sprain/strain, thoracic   . Depression   . Diverticulosis   . Gastric ulcer   . Hearing loss   . Hemorrhoids   . Hyperlipidemia   . Hypertension   . IBS (irritable bowel syndrome)   . Lumbar sprain and strain   . Migraines   . Nevus    atypical  . Obesity   . Osteopenia   . Panic attacks    Past Surgical History:  Procedure Laterality Date  . cataract surgery  2004   bilateral   Social History  Substance Use Topics  . Smoking status: Former Smoker    Quit date: 05/05/1995  . Smokeless tobacco: Never Used  . Alcohol use 0.0 oz/week    1 drink(s) per week     Comment: per week   family history includes Depression in her mother; Diabetes in her father; Hypertension in her brother and father; Stroke in her father.  ROS as above:  Medications: Current Outpatient Prescriptions  Medication Sig Dispense Refill  . carisoprodol (SOMA) 350 MG tablet Take 1 tablet (350 mg total) by mouth 3 (three) times daily as needed for muscle spasms. 90 tablet 1  . clonazePAM (KLONOPIN) 0.5 MG tablet     . dicyclomine (BENTYL) 20 MG tablet take 1 tablet by mouth every 6 hours 120 tablet 1  . enalapril (VASOTEC) 5 MG tablet take 1 tablet by mouth once daily 90 tablet 0  . fluticasone (FLONASE) 50  MCG/ACT nasal spray Place 2 sprays into both nostrils daily. 16 g 6  . hydrOXYzine (ATARAX/VISTARIL) 10 MG tablet take 1 tablet by mouth three times a day if needed 30 tablet 3  . loperamide (IMODIUM A-D) 2 MG tablet Take 2 mg by mouth as needed.      . metoprolol tartrate (LOPRESSOR) 25 MG tablet Take 1 tablet (25 mg total) by mouth 2 (two) times daily. Needs f/u appt specifically for blood pressure 60 tablet 0  . promethazine (PHENERGAN) 25 MG tablet take 1 tablet by mouth every 6 hours if needed for nausea 30 tablet 3  . sertraline (ZOLOFT) 100 MG tablet Take 200 mg by mouth daily.    . traZODone (DESYREL) 100 MG tablet Take 100 mg by mouth at bedtime.  0  . doxycycline (VIBRA-TABS) 100 MG tablet Take 1 tablet (100 mg total) by mouth 2 (two) times daily. 14 tablet 0  . nystatin cream (MYCOSTATIN) Apply 1 application topically 2 (two) times daily. 30 g 0  . triamcinolone cream (KENALOG) 0.5 % Apply 1 application topically 2 (two) times daily. To affected areas. 30 g 3   No current facility-administered medications for this visit.    Allergies  Allergen Reactions  . Aripiprazole     REACTION: nightmares.  Marland Kitchen  Cephalexin     REACTION: vomiting  . Lipitor [Atorvastatin] Other (See Comments)    Muscle pain  . Nsaids Other (See Comments)    Bleeding ulcer  . Simvastatin Other (See Comments)    Muscle aches/   . Skelaxin [Metaxalone] Other (See Comments)    Cough, SOB  . Sulfonamide Derivatives   . Zanaflex [Tizanidine Hcl]     dizziness    Health Maintenance Health Maintenance  Topic Date Due  . INFLUENZA VACCINE  12/02/2016  . TETANUS/TDAP  11/14/2023  . DEXA SCAN  Completed  . PNA vac Low Risk Adult  Completed     Exam:  BP 126/63   Pulse 94   Temp 97.8 F (36.6 C) (Oral)   Ht 5\' 6"  (1.676 m)   Wt 191 lb (86.6 kg)   BMI 30.83 kg/m  Gen: Well NAD HEENT: EOMI,  MMM Lungs: Normal work of breathing. CTABL Heart: RRR no MRG Abd: NABS, Soft. Nondistended,  Nontender Exts: Brisk capillary refill, warm and well perfused.  Skin: Small erythematous papule right medial posterior thigh nontender. Macular erythematous scaly rash under breasts bilaterally.   No results found for this or any previous visit (from the past 72 hour(s)). No results found.    Assessment and Plan: 82 y.o. female with  Tick bite: Patient does has some systemic signs or symptoms including night sweats and chills. I think it's reasonable to go ahead and treat empirically with doxycycline for potential tickborne illness although I suspect probably this is unrelated.  She does have a rash under her breasts that I don't think has anything to do with tick bite. I think it's more intertrigo and we'll treat that with nystatin and triamcinolone creams. Follow-up with PCP.   No orders of the defined types were placed in this encounter.  Meds ordered this encounter  Medications  . doxycycline (VIBRA-TABS) 100 MG tablet    Sig: Take 1 tablet (100 mg total) by mouth 2 (two) times daily.    Dispense:  14 tablet    Refill:  0  . nystatin cream (MYCOSTATIN)    Sig: Apply 1 application topically 2 (two) times daily.    Dispense:  30 g    Refill:  0  . triamcinolone cream (KENALOG) 0.5 %    Sig: Apply 1 application topically 2 (two) times daily. To affected areas.    Dispense:  30 g    Refill:  3     Discussed warning signs or symptoms. Please see discharge instructions. Patient expresses understanding.

## 2016-11-02 ENCOUNTER — Other Ambulatory Visit: Payer: Self-pay | Admitting: Family Medicine

## 2016-11-30 ENCOUNTER — Other Ambulatory Visit: Payer: Self-pay | Admitting: Sports Medicine

## 2016-12-31 ENCOUNTER — Other Ambulatory Visit: Payer: Self-pay | Admitting: Sports Medicine

## 2017-01-07 ENCOUNTER — Other Ambulatory Visit: Payer: Self-pay | Admitting: *Deleted

## 2017-01-07 MED ORDER — METOPROLOL TARTRATE 25 MG PO TABS: 25.0000 mg | ORAL_TABLET | Freq: Two times a day (BID) | ORAL | 3 refills | Status: DC

## 2017-01-13 DIAGNOSIS — S92355A Nondisplaced fracture of fifth metatarsal bone, left foot, initial encounter for closed fracture: Secondary | ICD-10-CM | POA: Diagnosis not present

## 2017-01-14 IMAGING — US US RENAL
1 series · 14 of 25 positions shown · non-contrast
Comparison: None

CLINICAL DATA: LEFT renal cysts, frequent urination for 2-3 days

EXAM:
RENAL / URINARY TRACT ULTRASOUND COMPLETE

[Series 1: us renal · 0.20mm/px · 14 of 32 slices shown]
[im 1/32]
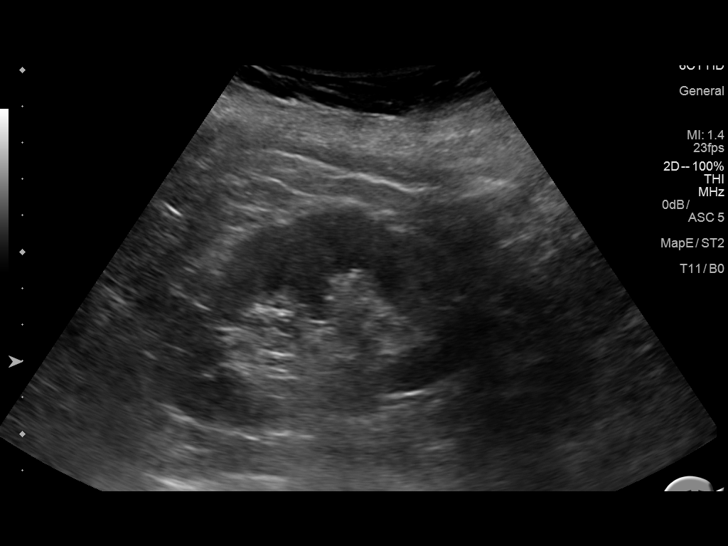
[im 3/32]
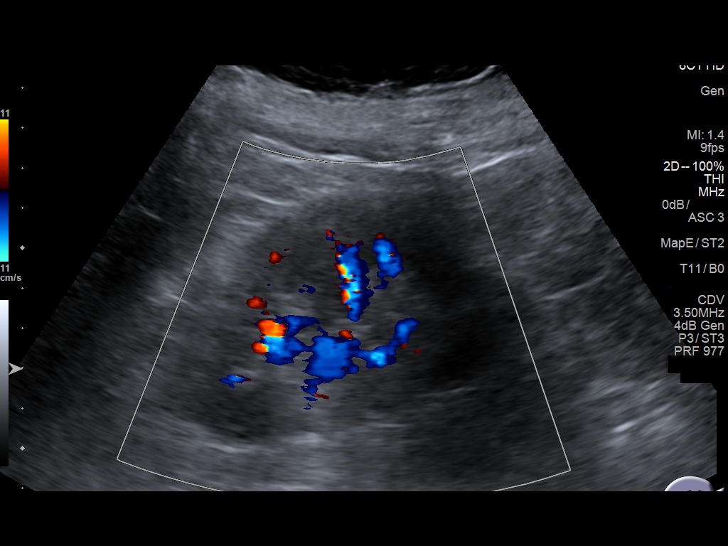
[im 6/32]
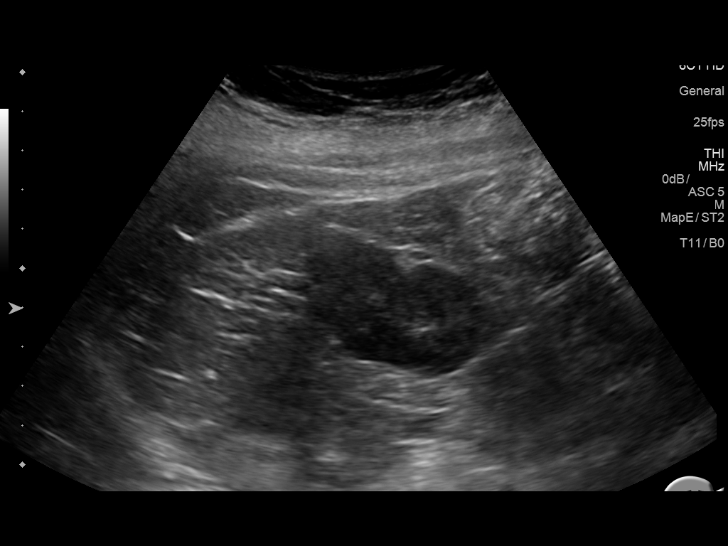
[im 8/32]
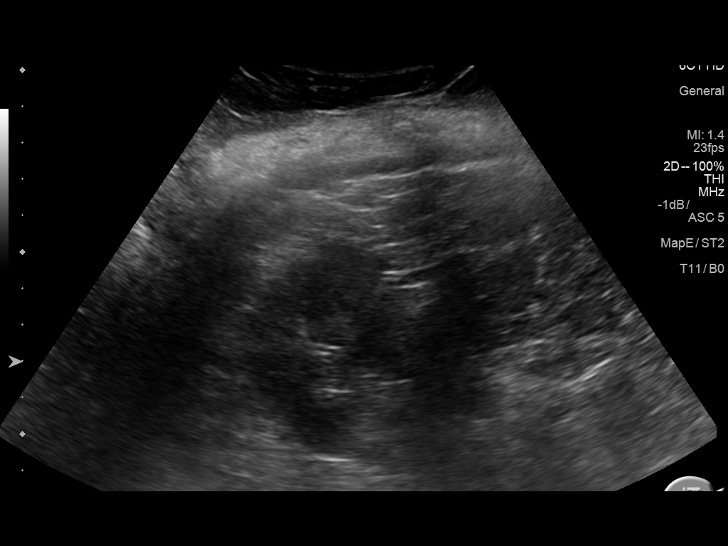
[im 11/32]
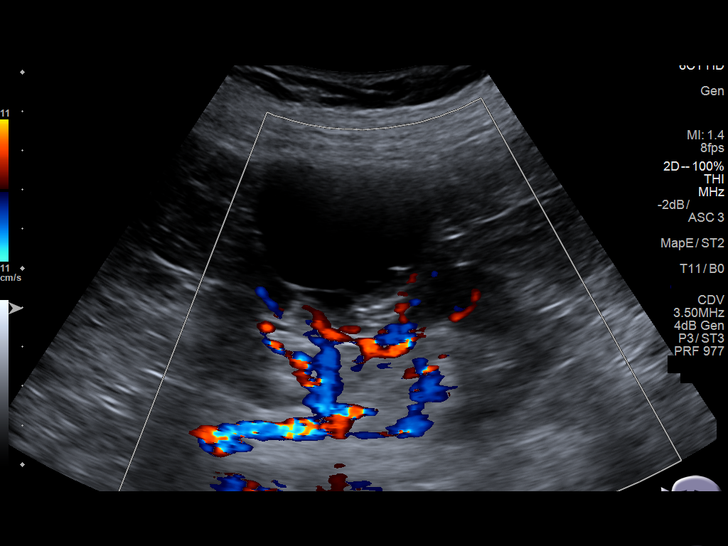
[im 12/32]
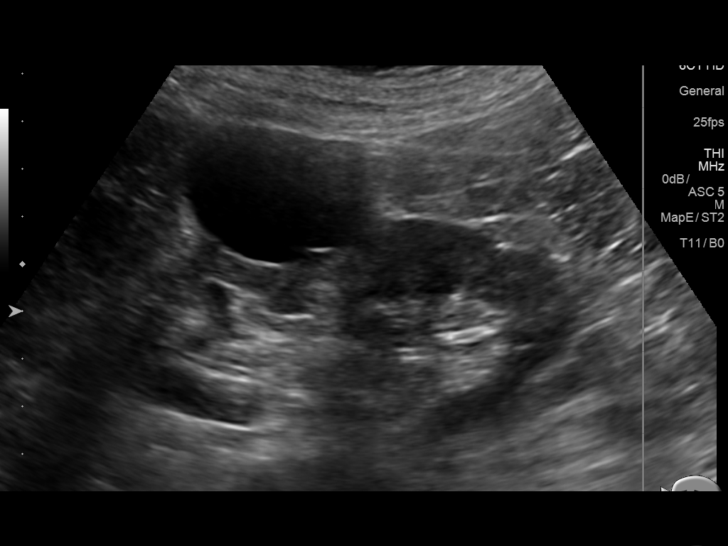
[im 15/32]
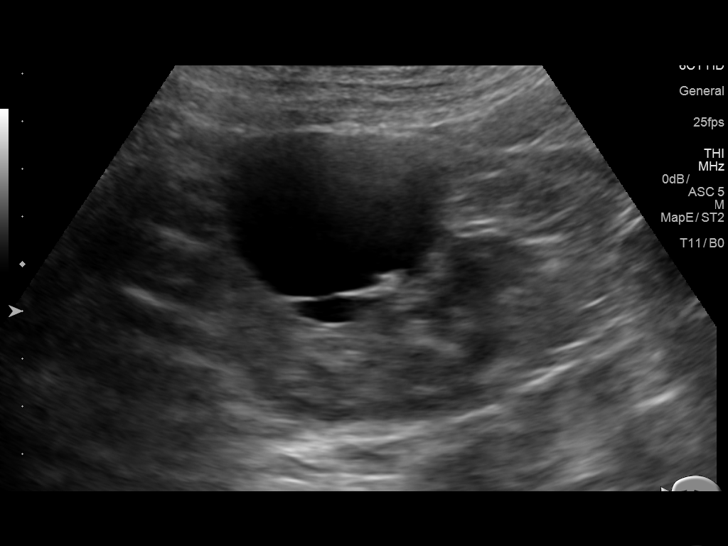
[im 17/32]
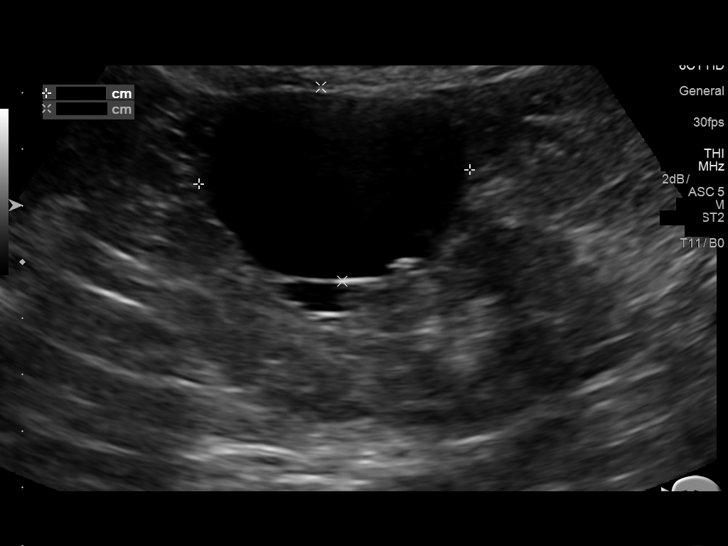
[im 20/32]
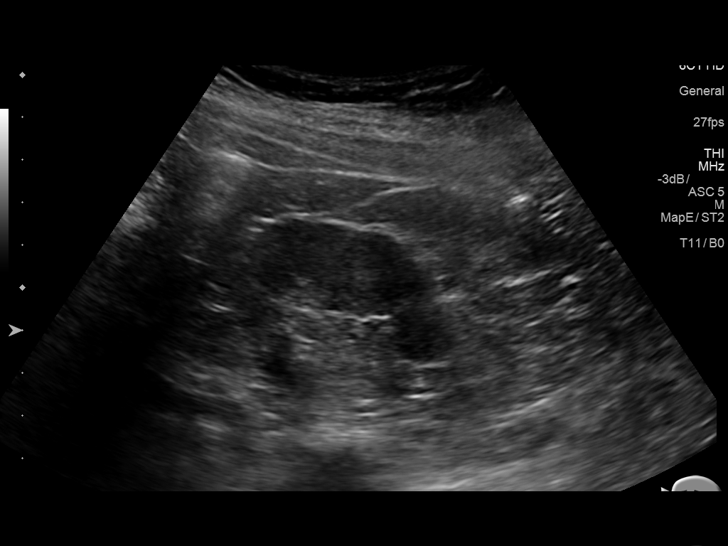
[im 21/32]
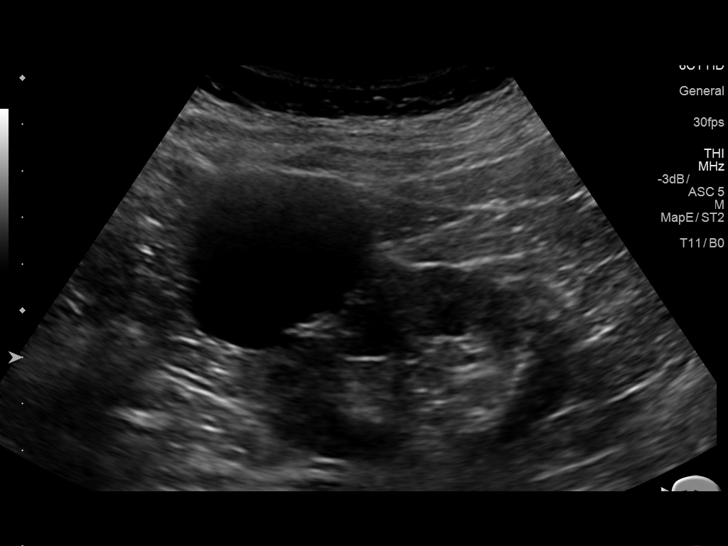
[im 24/32]
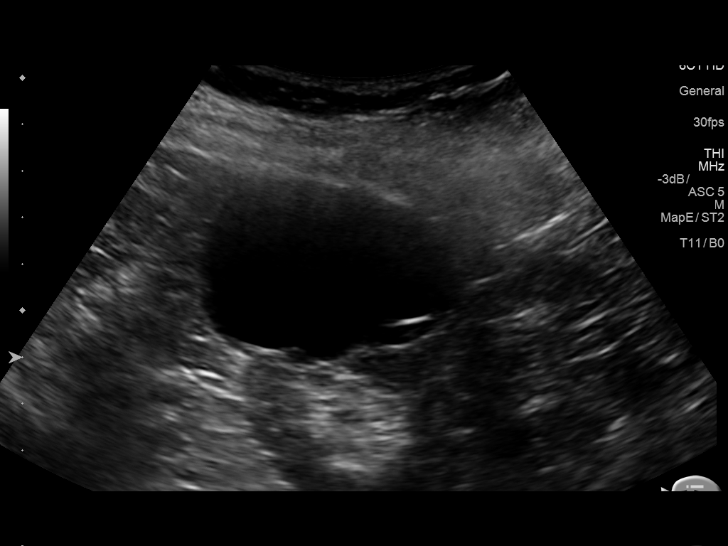
[im 26/32]
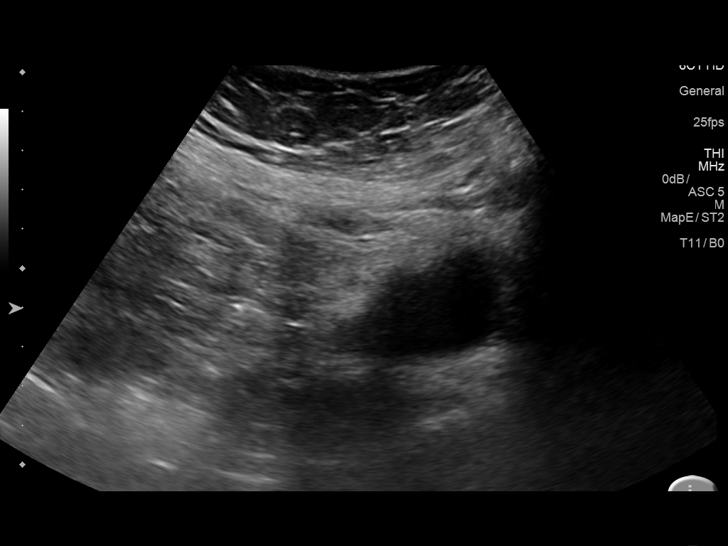
[im 29/32]
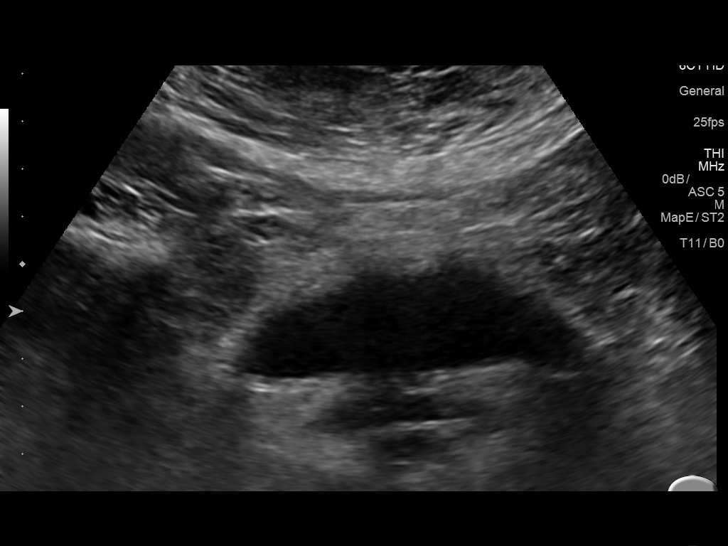
[im 32/32]
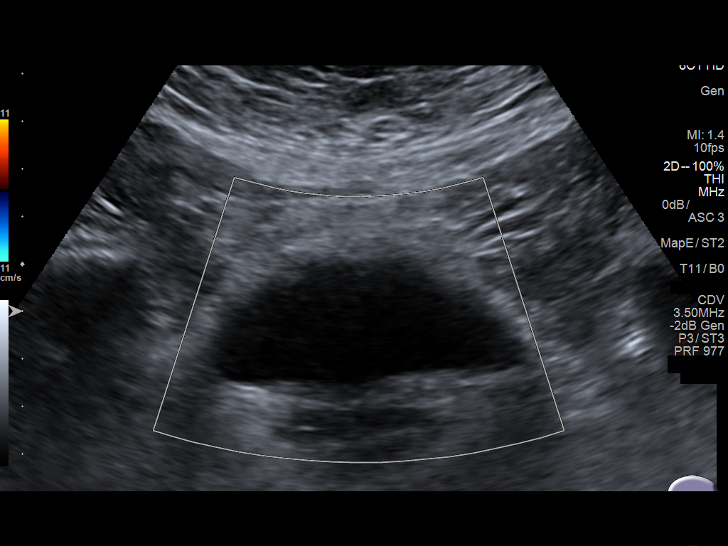

[14 of 25 positions shown; findings below may reference images not displayed]

FINDINGS: Right Kidney:

Length: 9.7 cm. Normal cortical thickness and echogenicity. No mass,
hydronephrosis or shadowing calcification.

Left Kidney:

Length: 10.3 cm. Normal cortical thickness and echogenicity.
Exophytic cyst at lateral mid LEFT kidney, mildly complicated by a
partial septation, overall measuring 4.8 x 4.0 x 5.5 cm. No
additional mass or hydronephrosis. No shadowing calcification.

Bladder:

Appears normal for degree of bladder distention.
IMPRESSION: Mildly complicated LEFT renal cyst measuring 4.8 x 4.0 x 5.5 cm in
size containing a single thin septation.

Otherwise negative exam.

## 2017-01-19 DIAGNOSIS — M79672 Pain in left foot: Secondary | ICD-10-CM | POA: Diagnosis not present

## 2017-01-19 DIAGNOSIS — M25562 Pain in left knee: Secondary | ICD-10-CM | POA: Diagnosis not present

## 2017-02-02 ENCOUNTER — Other Ambulatory Visit: Payer: Self-pay | Admitting: *Deleted

## 2017-02-02 MED ORDER — CARISOPRODOL 350 MG PO TABS: ORAL_TABLET | ORAL | 0 refills | Status: DC

## 2017-02-22 DIAGNOSIS — M5416 Radiculopathy, lumbar region: Secondary | ICD-10-CM | POA: Diagnosis not present

## 2017-02-22 DIAGNOSIS — M7061 Trochanteric bursitis, right hip: Secondary | ICD-10-CM | POA: Diagnosis not present

## 2017-03-02 ENCOUNTER — Other Ambulatory Visit: Payer: Self-pay | Admitting: Family Medicine

## 2017-03-19 DIAGNOSIS — Z87891 Personal history of nicotine dependence: Secondary | ICD-10-CM | POA: Diagnosis not present

## 2017-03-19 DIAGNOSIS — M533 Sacrococcygeal disorders, not elsewhere classified: Secondary | ICD-10-CM | POA: Diagnosis not present

## 2017-03-19 DIAGNOSIS — I10 Essential (primary) hypertension: Secondary | ICD-10-CM | POA: Diagnosis not present

## 2017-03-19 DIAGNOSIS — Z79899 Other long term (current) drug therapy: Secondary | ICD-10-CM | POA: Diagnosis not present

## 2017-03-22 ENCOUNTER — Encounter: Payer: Self-pay | Admitting: Family Medicine

## 2017-03-22 ENCOUNTER — Ambulatory Visit: Payer: Medicare Other | Admitting: Family Medicine

## 2017-03-22 VITALS — BP 144/64 | HR 78 | Wt 189.0 lb

## 2017-03-22 DIAGNOSIS — Z23 Encounter for immunization: Secondary | ICD-10-CM

## 2017-03-22 DIAGNOSIS — M545 Low back pain, unspecified: Secondary | ICD-10-CM

## 2017-03-22 DIAGNOSIS — G8929 Other chronic pain: Secondary | ICD-10-CM

## 2017-03-22 DIAGNOSIS — K589 Irritable bowel syndrome without diarrhea: Secondary | ICD-10-CM | POA: Insufficient documentation

## 2017-03-22 DIAGNOSIS — I10 Essential (primary) hypertension: Secondary | ICD-10-CM | POA: Diagnosis not present

## 2017-03-22 DIAGNOSIS — R87612 Low grade squamous intraepithelial lesion on cytologic smear of cervix (LGSIL): Secondary | ICD-10-CM | POA: Insufficient documentation

## 2017-03-22 MED ORDER — MELOXICAM 15 MG PO TABS: 15.0000 mg | ORAL_TABLET | Freq: Every day | ORAL | 1 refills | Status: DC

## 2017-03-22 MED ORDER — CARISOPRODOL 350 MG PO TABS: ORAL_TABLET | ORAL | 2 refills | Status: DC

## 2017-03-22 NOTE — Progress Notes (Signed)
Subjective:    Patient ID: Dawn Bailey, female    DOB: 06/08/1934, 82 y.o.   MRN: [id number]  HPI 82 year old female w/ history of lumbar degenerative disc disease comes in today for back pain and hip pain.  She was seen with similar symptoms approximately 6 months ago.  At the time she was referred to physical therapy and went for 1 session with her husband was also placed on hospice around that time and so she discontinued it. She did go see ortho a few weeks ago and they recommended an injection for bursitis. They recommended a consultation for pain management and they recommended an epidural for her back/DI joint dysfunction.  She is having a lot of pain. She is taking her soma.   Film is actually 1 of the few things that can actually give her relief if she just lays down ices it and takes her Soma.  Hypertension- Pt denies chest pain, SOB, dizziness, or heart palpitations.  Taking meds as directed w/o problems.  Denies medication side effects.      Review of Systems   BP (!) 144/64   Pulse 78   Wt 189 lb (85.7 kg)   SpO2 95%   BMI 30.51 kg/m     Allergies  Allergen Reactions  . Aripiprazole     REACTION: nightmares.  . Cephalexin     REACTION: vomiting  . Lipitor [Atorvastatin] Other (See Comments)    Muscle pain  . Nsaids Other (See Comments)    Bleeding ulcer  . Simvastatin Other (See Comments)    Muscle aches/   . Skelaxin [Metaxalone] Other (See Comments)    Cough, SOB  . Sulfonamide Derivatives   . Tolmetin     Other reaction(s): Other (See Comments) Bleeding ulcer  . Zanaflex [Tizanidine Hcl]     dizziness  . Sulfa Antibiotics Nausea Only    Past Medical History:  Diagnosis Date  . Allergy   . Anemia   . Back sprain/strain, thoracic   . Depression   . Diverticulosis   . Gastric ulcer   . Hearing loss   . Hemorrhoids   . Hyperlipidemia   . Hypertension   . IBS (irritable bowel syndrome)   . Lumbar sprain and strain   . Migraines   . Nevus    atypical  . Obesity   . Osteopenia   . Panic attacks     Past Surgical History:  Procedure Laterality Date  . cataract surgery  2004   bilateral    Social History   Socioeconomic History  . Marital status: Married    Spouse name: Not on file  . Number of children: Not on file  . Years of education: Not on file  . Highest education level: Not on file  Social Needs  . Financial resource strain: Not on file  . Food insecurity - worry: Not on file  . Food insecurity - inability: Not on file  . Transportation needs - medical: Not on file  . Transportation needs - non-medical: Not on file  Occupational History  . Not on file  Tobacco Use  . Smoking status: Former Smoker    Last attempt to quit: 05/05/1995    Years since quitting: 21.8  . Smokeless tobacco: Never Used  Substance and Sexual Activity  . Alcohol use: Yes    Alcohol/week: 0.0 oz    Types: 1 drink(s) per week    Comment: per week  . Drug use: No  Comment: Caffiene: 1 cup of tea  . Sexual activity: No    Comment: married, 2 children, LMP over 10 yrs ago, walks everyday, daily caffeine.  Other Topics Concern  . Not on file  Social History Narrative  . Not on file    Family History  Problem Relation Age of Onset  . Depression Mother   . Diabetes Father   . Hypertension Father   . Stroke Father   . Hypertension Brother     Outpatient Encounter Medications as of 03/22/2017  Medication Sig  . carisoprodol (SOMA) 350 MG tablet TAKE 1 TABLET BY MOUTH 3 TIMES A DAY AS NEEDED FOR MUSCLE SPASMS  . clonazePAM (KLONOPIN) 0.5 MG tablet   . dicyclomine (BENTYL) 20 MG tablet take 1 tablet by mouth every 6 hours  . enalapril (VASOTEC) 5 MG tablet take 1 tablet by mouth once daily  . fluticasone (FLONASE) 50 MCG/ACT nasal spray Place 2 sprays into both nostrils daily.  . hydrOXYzine (ATARAX/VISTARIL) 10 MG tablet take 1 tablet by mouth three times a day if needed  . imiquimod (ALDARA) 5 % cream Apply topically.  Marland Kitchen  loperamide (IMODIUM A-D) 2 MG tablet Take 2 mg by mouth as needed.    . metoprolol tartrate (LOPRESSOR) 25 MG tablet Take 1 tablet (25 mg total) by mouth 2 (two) times daily.  Marland Kitchen nystatin cream (MYCOSTATIN) Apply 1 application topically 2 (two) times daily.  . promethazine (PHENERGAN) 25 MG tablet take 1 tablet by mouth every 6 hours if needed for nausea  . sertraline (ZOLOFT) 100 MG tablet Take 200 mg by mouth daily.  . traZODone (DESYREL) 100 MG tablet Take 100 mg by mouth at bedtime.  . triamcinolone cream (KENALOG) 0.5 % Apply 1 application topically 2 (two) times daily. To affected areas.  . [DISCONTINUED] carisoprodol (SOMA) 350 MG tablet TAKE 1 TABLET BY MOUTH 3 TIMES A DAY AS NEEDED FOR MUSCLE SPASMS  . meloxicam (MOBIC) 15 MG tablet Take 1 tablet (15 mg total) daily by mouth.   No facility-administered encounter medications on file as of 03/22/2017.           Objective:   Physical Exam  Constitutional: She is oriented to person, place, and time. She appears well-developed and well-nourished.  Even today she is sitting uncomfortably leaning putting more pressure on her left hip compared to her right when sitting.  HENT:  Head: Normocephalic and atraumatic.  Cardiovascular: Normal rate, regular rhythm and normal heart sounds.  Pulmonary/Chest: Effort normal and breath sounds normal.  Musculoskeletal:  Slightly decreased lumbar flexion, normal extension, and symmetric rotation right and left.  Nontender over the lumbar spine or SI joints.  Negative straight leg raise bilaterally.  Hip, knee, ankle strength is 5 out of 5.  Patellar reflexes are 1+ bilaterally.  Neurological: She is alert and oriented to person, place, and time.  Skin: Skin is warm and dry.  Psychiatric: She has a normal mood and affect. Her behavior is normal.          Assessment & Plan:  Left low back pain chronic, currently in acute flare-discussed options.  I really think she would benefit from getting  back into physical therapy.  Before the co-pay it was quite expensive and it was difficult for her to manage that financially.  We discussed that she could certainly check with her current insurance provider to see if there may be someone else that is considered and what network.  But it sounds like her co-pay  is basically the equivalent of a specialty co-pay.  We discussed that if she is not able to go 3 times a week she can certainly work with them ongoing once or maybe even twice a week.  She also has to have a sitter for her husband who has advanced dementia for her to be able to calm so will take a bit of working this out but she might be able to come while he is getting some home services.  Did refill her Manuela Neptune today which does help her pain.  So recommended that she continue to use an anti-inflammatory as well as as tolerated.  We will send over Mobic.  She is had GI intolerance with ibuprofen  HTN -so due for CMP lipid panel and regular blood work. BP a little today but she is in pain. Follow closely.

## 2017-03-23 ENCOUNTER — Encounter: Payer: Self-pay | Admitting: Family Medicine

## 2017-04-08 ENCOUNTER — Encounter: Payer: Self-pay | Admitting: Rehabilitative and Restorative Service Providers"

## 2017-04-08 ENCOUNTER — Ambulatory Visit: Payer: Medicare Other | Admitting: Rehabilitative and Restorative Service Providers"

## 2017-04-08 DIAGNOSIS — M545 Low back pain: Secondary | ICD-10-CM | POA: Diagnosis not present

## 2017-04-08 DIAGNOSIS — G8929 Other chronic pain: Secondary | ICD-10-CM

## 2017-04-08 DIAGNOSIS — R29898 Other symptoms and signs involving the musculoskeletal system: Secondary | ICD-10-CM

## 2017-04-08 DIAGNOSIS — E782 Mixed hyperlipidemia: Secondary | ICD-10-CM | POA: Diagnosis not present

## 2017-04-08 DIAGNOSIS — M6281 Muscle weakness (generalized): Secondary | ICD-10-CM

## 2017-04-08 DIAGNOSIS — M549 Dorsalgia, unspecified: Secondary | ICD-10-CM

## 2017-04-08 LAB — COMPLETE METABOLIC PANEL WITH GFR
AG Ratio: 1.4 (calc) (ref 1.0–2.5)
ALT: 10 U/L (ref 6–29)
AST: 19 U/L (ref 10–35)
Albumin: 4.3 g/dL (ref 3.6–5.1)
Alkaline phosphatase (APISO): 83 U/L (ref 33–130)
BUN: 12 mg/dL (ref 7–25)
CO2: 27 mmol/L (ref 20–32)
Calcium: 9.5 mg/dL (ref 8.6–10.4)
Chloride: 99 mmol/L (ref 98–110)
Creat: 0.61 mg/dL (ref 0.60–0.88)
GFR, Est African American: 99 mL/min/{1.73_m2} (ref >=60)
GFR, Est Non African American: 86 mL/min/{1.73_m2} (ref >=60)
Globulin: 3 g/dL (calc) (ref 1.9–3.7)
Glucose, Bld: 90 mg/dL (ref 65–99)
Potassium: 4.5 mmol/L (ref 3.5–5.3)
Sodium: 136 mmol/L (ref 135–146)
Total Bilirubin: 0.4 mg/dL (ref 0.2–1.2)
Total Protein: 7.3 g/dL (ref 6.1–8.1)

## 2017-04-08 LAB — LIPID PANEL
Cholesterol: 288 mg/dL — ABNORMAL HIGH (ref <200)
HDL: 88 mg/dL (ref >50)
LDL Cholesterol (Calc): 165 mg/dL (calc) — ABNORMAL HIGH
Non-HDL Cholesterol (Calc): 200 mg/dL (calc) — ABNORMAL HIGH (ref <130)
Total CHOL/HDL Ratio: 3.3 (calc) (ref <5.0)
Triglycerides: 194 mg/dL — ABNORMAL HIGH (ref <150)

## 2017-04-08 LAB — CBC
HCT: 37.1 % (ref 35.0–45.0)
Hemoglobin: 12 g/dL (ref 11.7–15.5)
MCH: 24.6 pg — ABNORMAL LOW (ref 27.0–33.0)
MCHC: 32.3 g/dL (ref 32.0–36.0)
MCV: 76 fL — ABNORMAL LOW (ref 80.0–100.0)
MPV: 12.4 fL (ref 7.5–12.5)
Platelets: 160 10*3/uL (ref 140–400)
RBC: 4.88 10*6/uL (ref 3.80–5.10)
RDW: 14.5 % (ref 11.0–15.0)
WBC: 6.4 10*3/uL (ref 3.8–10.8)

## 2017-04-08 LAB — TSH: TSH: 1.34 mIU/L (ref 0.40–4.50)

## 2017-04-08 NOTE — Therapy (Signed)
[place], [place], [address] Phone: [phone]   Fax:  [phone]  Physical Therapy Evaluation  Patient Details  Name: Dawn Bailey MRN: [id number] Date of Birth: Jun 15, 1934 Referring Provider: Dr Madilyn Fireman   Encounter Date: 04/08/2017  PT End of Session - 04/08/17 1015    Visit Number  1    Number of Visits  12    Date for PT Re-Evaluation  05/23/17    PT Start Time  0947    PT Stop Time  1051    PT Time Calculation (min)  64 min    Activity Tolerance  Patient limited by pain    Behavior During Therapy  [facility] for tasks assessed/performed       Past Medical History:  Diagnosis Date  . Allergy   . Anemia   . Back sprain/strain, thoracic   . Depression   . Diverticulosis   . Gastric ulcer   . Hearing loss   . Hemorrhoids   . Hyperlipidemia   . Hypertension   . IBS (irritable bowel syndrome)   . Lumbar sprain and strain   . Migraines   . Nevus    atypical  . Obesity   . Osteopenia   . Panic attacks     Past Surgical History:  Procedure Laterality Date  . cataract surgery  2004   bilateral    There were no vitals filed for this visit.   Subjective Assessment - 04/08/17 0945    Subjective  History of LBP for many years. She has had some pain since she was 82 years old. She had an injury to back with a fall onto her back. She has had intermittent LBP since that injury. She has a diagnosis of DDD and degenerative LBP. She has had a flare up of symptoms in the past 3-4 months after a fracture of Lt foot 01/13/17. She has had Rtr groin pain in the past couple of weeks.     Pertinent History  cyst removal LB at 82; ft Lt foot 01/13/17; see medical history     How long can you sit comfortably?  30 min     How long can you stand comfortably?  10 min     How long can you walk comfortably?  10 min     Diagnostic tests  xrays     Patient Stated Goals  relieve the pain; ease the pain; be  able to walk and do things she is unable to do now     Currently in Pain?  Yes    Pain Score  5     Pain Location  Back    Pain Orientation  Right;Left;Posterior;Lower    Pain Descriptors / Indicators  Discomfort;Throbbing;Aching    Pain Type  Chronic pain    Pain Radiating Towards  into the Rt hip and groin     Pain Onset  More than a month ago    Pain Frequency  Constant    Aggravating Factors   sitting; lifting; reaching; bending; moving     Pain Relieving Factors  meds; rest; ice         [facility] PT Assessment - 04/08/17 0001      Assessment   Medical Diagnosis  LBP Rt hip and groin pain     Referring Provider  Dr Madilyn Fireman    Onset Date/Surgical Date  01/13/17 flare up - pain present for > 80 years     Hand  Dominance  Right    Next MD Visit  12/18    Prior Therapy  yes       Precautions   Precautions  None      Balance Screen   Has the patient fallen in the past 6 months  Yes    How many times?  1    Has the patient had a decrease in activity level because of a fear of falling?   No    Is the patient reluctant to leave their home because of a fear of falling?   No      Home Environment   Additional Comments  single level home - 2 small steps to enter home no railing       Prior Function   Level of Independence  Independent    Vocation  Retired    Programme researcher, broadcasting/film/video     Leisure  cares for husband who has dementia       Observation/Other Assessments   Focus on Therapeutic Outcomes (FOTO)   75% limitation       Sensation   Additional Comments  WFL's per pt report       Posture/Postural Control   Posture Comments  head forward shoulders rounded; flexed forward at hips and trunk in sitting and standing       AROM   Right/Left Hip  -- limited end ranges bilat Rt > Lt tight hip flexors/adductors    Lumbar Flexion  50%    Lumbar Extension  10%    Lumbar - Right Side Bend  25%    Lumbar - Left Side Bend  20%    Lumbar - Right Rotation  25%    Lumbar -  Left Rotation  20%      Strength   Overall Strength Comments  patient unable to tolerate positions for manual muscle testing - functional strength for transfers and gait    Strength Assessment Site  -- bilat knee flex/ext in sitting 5-/5 to 5/5       Flexibility   Hamstrings  tight Rt > Lt    ITB  tight bilat     Piriformis  tight bilat       Palpation   Palpation comment  muscular tightness Rt > Lt hip flexors/adductors; lumbar paraspinals; QL; lats into hip musculature assessed in supine and sitting       Ambulation/Gait   Gait Comments  ambulates with abnormal posture and limp Rt LE              Objective measurements completed on examination: See above findings.      [facility] Adult PT Treatment/Exercise - 04/08/17 0001      Self-Care   Self-Care  Other Self-Care Comments    Other Self-Care Comments   Pt instructed on safety, parameters, and application of TENS unit.  Pt verbalized understanding.       Lumbar Exercises: Seated   Sit to Stand  5 reps with core engaged      Lumbar Exercises: Supine   Ab Set  10 reps;5 seconds    AB Set Limitations  tactile and VC for technique     Other Supine Lumbar Exercises  pt educated on sit to/from supine via log roll with demo and cues; pt able to returned demo with heavy cues (pt very guarded due to spasming of LB)      Modalities   Modalities  Electrical Stimulation;Cryotherapy      Cryotherapy  Number Minutes Cryotherapy  15 Minutes    Cryotherapy Location  Lumbar Spine glutes    Type of Cryotherapy  Ice pack      Electrical Stimulation   Electrical Stimulation Location  Rt posterior hip and lumbar paraspinals    Electrical Stimulation Action   IFC    Electrical Stimulation Parameters  to tolerance     Electrical Stimulation Goals  Pain;Tone             PT Education - 04/08/17 1300    Education provided  Yes    Education Details  education re - transitional movement and transfers; TENS unit     Person(s)  Educated  Patient    Methods  Explanation;Verbal cues    Comprehension  Verbalized understanding;Returned demonstration;Verbal cues required          PT Long Term Goals - 04/08/17 1021      PT LONG TERM GOAL #1   Title  Decrease muscular tightness through the Rt lumbar and hip musculature to decrease pain and improve function 05/23/17    Time  6    Period  Weeks    Status  New      PT LONG TERM GOAL #2   Title  Increase strength functionally allowing patient to transfer with improved body mechanics 05/23/17    Time  6    Period  Weeks    Status  New      PT LONG TERM GOAL #3   Title  Increase functional standing and walking tolerance to 15-20 min with managable pain 05/23/17    Time  6    Period  Weeks    Status  New      PT LONG TERM GOAL #4   Title  Independent in HEP 05/23/17    Time  6    Period  Weeks    Status  New      PT LONG TERM GOAL #5   Title  Improve FOTO to </= 63% limitation 05/23/17    Period  Weeks    Status  New             Plan - 04/08/17 1016    Clinical Impression Statement  Mikaylah presents with flare up of chronic LBP with increased pain in the Rt lumbar and hip areas including groin and anterior thigh. Patient has poor posture and alignment; limited trunk and LE mobility and ROM; functional weakness and limited ADL's; muscular tightness and pain with palpation; poor body mechanics and movement patterns; pain on a constant daily basis. Patient will benefit from PT to address problems identified.     Clinical Presentation  Evolving    Clinical Decision Making  Low    Rehab Potential  Good    PT Frequency  2x / week    PT Duration  6 weeks    PT Treatment/Interventions  Patient/family education;ADLs/Self Care Home Management;Cryotherapy;Electrical Stimulation;Iontophoresis 4mg /ml Dexamethasone;Moist Heat;Ultrasound;Dry needling;Manual techniques;Neuromuscular re-education;Functional mobility training;Therapeutic activities;Therapeutic  exercise;Gait training    PT Next Visit Plan  review HEP; continue with gentle core stabilization and strengthening as tolerated; manual work initially through the hip flexors/adductors; back care education; modalities as indicated     Consulted and Agree with Plan of Care  Patient       Patient will benefit from skilled therapeutic intervention in order to improve the following deficits and impairments:  Postural dysfunction, Improper body mechanics, Pain, Increased muscle spasms, Decreased range of motion, Decreased mobility, Decreased strength, Decreased activity  tolerance, Abnormal gait  Visit Diagnosis: Chronic right-sided low back pain, with sciatica presence unspecified - Plan: PT plan of care cert/re-cert  Other symptoms and signs involving the musculoskeletal system - Plan: PT plan of care cert/re-cert  Muscle weakness (generalized) - Plan: PT plan of care cert/re-cert  [facility] PT PB G-CODES - 05-01-2017 1023    Functional Assessment Tool Used   Evaluation; FOTO; clinical assessment     Functional Limitations  Mobility: Walking and moving around    Mobility: Walking and Moving Around Current Status  At least 80 percent but less than 100 percent impaired, limited or restricted    Mobility: Walking and Moving Around Goal Status [phone])  At least 60 percent but less than 80 percent impaired, limited or restricted        Problem List Patient Active Problem List   Diagnosis Date Noted  . Papanicolaou smear of cervix with low grade squamous intraepithelial lesion (LGSIL) 03/22/2017  . IBS (irritable bowel syndrome) 03/22/2017  . Insomnia 05/08/2014  . Low back pain 10/27/2011  . Overweight(278.02) 06/15/2011  . Major depressive disorder, recurrent episode, moderate (HCC) 05/06/2011    Class: Chronic  . Tremor 01/16/2011  . Chronic diarrhea 02/19/2010  . UNSPECIFIED HEARING LOSS 07/19/2009  . Lumbar degenerative disc disease 11/28/2008  . RHINITIS, ALLERGIC NOS 09/23/2006  .  HYPERCHOLESTEROLEMIA 02/09/2006  . Posttraumatic stress disorder 02/09/2006  . Migraine 02/09/2006  . HYPERTENSION, BENIGN SYSTEMIC 02/09/2006  . OSTEOPENIA 02/09/2006    Raiden Haydu Nilda Simmer PT, MPH  May 01, 2017, 1:04 PM  [facility] [place], [place], [address] Phone: [phone]   Fax:  [phone]  Name: PETREA FREDENBURG MRN: [id number] Date of Birth: 20-Apr-1935

## 2017-04-08 NOTE — Progress Notes (Signed)
All labs are normal. 

## 2017-04-09 ENCOUNTER — Other Ambulatory Visit: Payer: Self-pay | Admitting: Family Medicine

## 2017-04-09 MED ORDER — PRAVASTATIN SODIUM 20 MG PO TABS: 20.0000 mg | ORAL_TABLET | ORAL | 3 refills | Status: DC

## 2017-04-14 ENCOUNTER — Encounter: Payer: Self-pay | Admitting: Physical Therapy

## 2017-04-16 ENCOUNTER — Encounter: Payer: Self-pay | Admitting: Physical Therapy

## 2017-04-21 ENCOUNTER — Ambulatory Visit: Payer: Medicare Other | Admitting: Physical Therapy

## 2017-04-21 DIAGNOSIS — G8929 Other chronic pain: Secondary | ICD-10-CM | POA: Diagnosis not present

## 2017-04-21 DIAGNOSIS — M545 Low back pain, unspecified: Secondary | ICD-10-CM

## 2017-04-21 DIAGNOSIS — R29898 Other symptoms and signs involving the musculoskeletal system: Secondary | ICD-10-CM

## 2017-04-21 DIAGNOSIS — M6281 Muscle weakness (generalized): Secondary | ICD-10-CM

## 2017-04-21 NOTE — Therapy (Addendum)
[place], [place], [address] Phone: [phone]   Fax:  [phone]  Physical Therapy Treatment  Patient Details  Name: Dawn Bailey MRN: [id number] Date of Birth: November 27, 1934 Referring Provider: Dr. Madilyn Fireman   Encounter Date: 04/21/2017  PT End of Session - 04/21/17 1025    Visit Number  2    Number of Visits  12    Date for PT Re-Evaluation  05/23/17    PT Start Time  1022 brought back late due to Epic issue.    PT Stop Time  1124    PT Time Calculation (min)  62 min    Behavior During Therapy  WFL for tasks assessed/performed       Past Medical History:  Diagnosis Date  . Allergy   . Anemia   . Back sprain/strain, thoracic   . Depression   . Diverticulosis   . Gastric ulcer   . Hearing loss   . Hemorrhoids   . Hyperlipidemia   . Hypertension   . IBS (irritable bowel syndrome)   . Lumbar sprain and strain   . Migraines   . Nevus    atypical  . Obesity   . Osteopenia   . Panic attacks     Past Surgical History:  Procedure Laterality Date  . cataract surgery  2004   bilateral    There were no vitals filed for this visit.  Subjective Assessment - 04/21/17 1025    Subjective  Pt reports she had difficulty with the abdominal exercise. It was making her back cramp.  She is no longer getting relief with laying flat, nor using ice.  Her biggest complaint is throbbing in Rt back and thigh at night for hours.   Pain varies during day.      Patient Stated Goals  relieve the pain; ease the pain; be able to walk and do things she is unable to do now     Currently in Pain?  Yes    Pain Score  1     Pain Location  Back    Pain Orientation  Right    Pain Descriptors / Indicators  Dull    Aggravating Factors   sitting, lifting, reaching, bending, moving.     Pain Relieving Factors  ??         [facility] PT Assessment - 04/21/17 0001      Assessment   Medical Diagnosis  LBP Rt hip  and groin pain     Referring Provider  Dr. Madilyn Fireman    Onset Date/Surgical Date  01/13/17 flare up - pain present for > 50 years     Hand Dominance  Right    Next MD Visit  to be scheduled.     Prior Therapy  yes       Palpation   SI assessment   Rt ASIS lower than Lt in supine    Palpation comment  point tender in Rt ant superior thigh and at ASIS        [facility] Adult PT Treatment/Exercise - 04/21/17 0001      Lumbar Exercises: Seated   Other Seated Lumbar Exercises  seated on dynadisc:  pelvic movement - side to side tilts, ant to neutral (post tilt was painful), circles CW/CCW     Other Seated Lumbar Exercises  TA/multifidi contraction with hands pressing into lap x 5 sec hold x 5 reps; trial of anti-rotation (light) resistance with pressure of PTA hands agains  pt's held at waist level.        Lumbar Exercises: Supine   Ab Set  10 reps;5 seconds    AB Set Limitations  improved tolerance with VC to decrease intensity of contraction and maintain neutral spine    Clam  10 reps with ab set    Bent Knee Raise  Limitations    Bent Knee Raise Limitations  back spasmed with 1 rep, stopped.       Modalities   Modalities  Electrical Stimulation;Moist Heat      Moist Heat Therapy   Number Minutes Moist Heat  15 Minutes    Moist Heat Location  Hip Rt ant/post      Electrical Stimulation   Electrical Stimulation Location  Rt posterior hip and lumbar paraspinals    Electrical Stimulation Action  IFC    Electrical Stimulation Parameters  to tolerance     Electrical Stimulation Goals  Pain;Tone      Manual Therapy   Manual Therapy  Muscle Energy Technique;Soft tissue mobilization;Myofascial release    Manual therapy comments  pt in supported supine    Soft tissue mobilization  Rt ant / lateral thigh     Myofascial Release  Rt iliopsoas     Muscle Energy Technique  muscle energy for Rt ant rotated ilium                  PT Long Term Goals - 04/21/17 1035      PT LONG TERM  GOAL #1   Title  Decrease muscular tightness through the Rt lumbar and hip musculature to decrease pain and improve function 05/23/17    Time  6    Period  Weeks    Status  On-going      PT LONG TERM GOAL #2   Title  Increase strength functionally allowing patient to transfer with improved body mechanics 05/23/17    Time  6    Period  Weeks    Status  On-going      PT LONG TERM GOAL #3   Title  Increase functional standing and walking tolerance to 15-20 min with managable pain 05/23/17    Time  6    Period  Weeks    Status  On-going      PT LONG TERM GOAL #4   Title  Independent in HEP 05/23/17    Time  6    Period  Weeks    Status  On-going      PT LONG TERM GOAL #5   Title  Improve FOTO to </= 63% limitation 05/23/17    Time  6    Period  Weeks    Status  On-going            Plan - 04/21/17 1133    Clinical Impression Statement  Pt reported Rt back spasms when ever she performed a post pelvic tilt (whether intentional or incidentally). She has limited body awareness of trunk/pelvic motions; req multiple cues for exercises for lumbar/core.  She was point tender in Rt ant hip/thigh; reduced with manual therapy. ASIS appeared equal in supine and standing at end of session. Pt reported reduction of pain at end of session.     Rehab Potential  Good    PT Frequency  2x / week    PT Duration  6 weeks    PT Treatment/Interventions  Patient/family education;ADLs/Self Care Home Management;Cryotherapy;Electrical Stimulation;Iontophoresis '4mg'$ /ml Dexamethasone;Moist Heat;Ultrasound;Dry needling;Manual techniques;Neuromuscular re-education;Functional mobility training;Therapeutic activities;Therapeutic exercise;Gait training  PT Next Visit Plan  Assess pelvic alignment; issue gentle hip flexor stretch for HEP.  cont ed on body mechanics and awareness, gentle core stabilization.     Consulted and Agree with Plan of Care  Patient       Patient will benefit from skilled therapeutic  intervention in order to improve the following deficits and impairments:  Postural dysfunction, Improper body mechanics, Pain, Increased muscle spasms, Decreased range of motion, Decreased mobility, Decreased strength, Decreased activity tolerance, Abnormal gait  Visit Diagnosis: Chronic right-sided low back pain, with sciatica presence unspecified  Other symptoms and signs involving the musculoskeletal system  Muscle weakness (generalized)  Acute right-sided low back pain without sciatica     Problem List Patient Active Problem List   Diagnosis Date Noted  . Papanicolaou smear of cervix with low grade squamous intraepithelial lesion (LGSIL) 03/22/2017  . IBS (irritable bowel syndrome) 03/22/2017  . Insomnia 05/08/2014  . Low back pain 10/27/2011  . Overweight(278.02) 06/15/2011  . Major depressive disorder, recurrent episode, moderate (HCC) 05/06/2011    Class: Chronic  . Tremor 01/16/2011  . Chronic diarrhea 02/19/2010  . UNSPECIFIED HEARING LOSS 07/19/2009  . Lumbar degenerative disc disease 11/28/2008  . RHINITIS, ALLERGIC NOS 09/23/2006  . HYPERCHOLESTEROLEMIA 02/09/2006  . Posttraumatic stress disorder 02/09/2006  . Migraine 02/09/2006  . HYPERTENSION, BENIGN SYSTEMIC 02/09/2006  . OSTEOPENIA 02/09/2006   Kerin Perna, PTA 04/21/17 11:53 AM  [place], [place], [address] Phone: [phone]   Fax:  [phone]  Name: Dawn Bailey MRN: [id number] Date of Birth: 1934/07/08  PHYSICAL THERAPY DISCHARGE SUMMARY  Visits from Start of Care: 2  Current functional level related to goals / functional outcomes: See last progress note for discharge status   Remaining deficits: Unknown    Education / Equipment: HEP  Plan: Patient agrees to discharge.  Patient goals were not met. Patient is being discharged due to not returning since the last visit.  ?????     Celyn P.  Helene Kelp PT, MPH 06/16/17 2:25 PM

## 2017-04-23 ENCOUNTER — Encounter: Payer: Medicare Other | Admitting: Physical Therapy

## 2017-05-25 ENCOUNTER — Other Ambulatory Visit: Payer: Self-pay | Admitting: Family Medicine

## 2017-05-26 ENCOUNTER — Other Ambulatory Visit: Payer: Self-pay | Admitting: *Deleted

## 2017-05-26 MED ORDER — METOPROLOL TARTRATE 25 MG PO TABS: 25.0000 mg | ORAL_TABLET | Freq: Two times a day (BID) | ORAL | 1 refills | Status: DC

## 2017-05-26 MED ORDER — MELOXICAM 15 MG PO TABS: 15.0000 mg | ORAL_TABLET | Freq: Every day | ORAL | 1 refills | Status: DC

## 2017-06-24 ENCOUNTER — Other Ambulatory Visit: Payer: Self-pay | Admitting: Family Medicine

## 2017-06-24 NOTE — Telephone Encounter (Signed)
Looked pt up in controlled substance registry she last refilled medication 05/26/17 #90. Will fwd to pcp for review and signature.Elouise Munroe, [place]

## 2017-07-13 ENCOUNTER — Other Ambulatory Visit: Payer: Self-pay | Admitting: Family Medicine

## 2017-08-09 ENCOUNTER — Encounter: Payer: Self-pay | Admitting: Gastroenterology

## 2017-09-11 ENCOUNTER — Other Ambulatory Visit: Payer: Self-pay | Admitting: Family Medicine

## 2017-09-29 ENCOUNTER — Other Ambulatory Visit: Payer: Self-pay | Admitting: Family Medicine

## 2017-10-02 ENCOUNTER — Other Ambulatory Visit: Payer: Self-pay | Admitting: Family Medicine

## 2017-10-04 ENCOUNTER — Other Ambulatory Visit: Payer: Self-pay | Admitting: Family Medicine

## 2017-10-04 NOTE — Telephone Encounter (Signed)
Routed to pcp for signature.Omar Gayden Lynetta, CMA  

## 2017-10-18 ENCOUNTER — Ambulatory Visit: Payer: Self-pay | Admitting: Family Medicine

## 2017-10-18 ENCOUNTER — Telehealth: Payer: Self-pay | Admitting: Family Medicine

## 2017-10-18 NOTE — Telephone Encounter (Signed)
PT called to cancel appt same day 10/18/17 10:30

## 2017-10-28 ENCOUNTER — Encounter: Payer: Self-pay | Admitting: Family Medicine

## 2017-10-28 ENCOUNTER — Ambulatory Visit: Payer: Medicare Other | Admitting: Family Medicine

## 2017-10-28 VITALS — BP 134/53 | HR 80 | Ht 66.0 in | Wt 185.0 lb

## 2017-10-28 DIAGNOSIS — L814 Other melanin hyperpigmentation: Secondary | ICD-10-CM

## 2017-10-28 DIAGNOSIS — Z636 Dependent relative needing care at home: Secondary | ICD-10-CM | POA: Diagnosis not present

## 2017-10-28 DIAGNOSIS — L821 Other seborrheic keratosis: Secondary | ICD-10-CM

## 2017-10-28 MED ORDER — PROMETHAZINE HCL 25 MG PO TABS: 25.0000 mg | ORAL_TABLET | Freq: Four times a day (QID) | ORAL | 0 refills | Status: DC | PRN

## 2017-10-28 NOTE — Progress Notes (Signed)
Subjective:    Patient ID: Dawn Bailey, female    DOB: 29-Jan-1935, 83 y.o.   MRN: [id number]  HPI 83 year old female comes in today wanting to address a skin lesion on her right forearm. She reports a change in size.  They are not painful, irritating or itchy.  She is also feeling very stressed and overwhelmed.  Her husband's dementia is advancing and she is having a lot of difficulty.  He is requiring 24-hour care.  They do have someone coming in from the [place] twice a week for 3 hours each time to help him with grooming bathing and dressing.  She says that does provide her some relief but she uses that time to catch up on housework and do laundry.  She is getting to the point where she really just needs a few hours each week to get out of the house and decompress.  Her children know somewhat about the situation but are not fully engaged are not helping to come and take care of him and give her a break.  Review of Systems  BP (!) 134/53   Pulse 80   Ht 5\' 6"  (1.676 m)   Wt 185 lb (83.9 kg)   SpO2 97%   BMI 29.86 kg/m     Allergies  Allergen Reactions  . Aripiprazole     REACTION: nightmares.  . Cephalexin     REACTION: vomiting  . Lipitor [Atorvastatin] Other (See Comments)    Muscle pain  . Nsaids Other (See Comments)    Bleeding ulcer  . Simvastatin Other (See Comments)    Muscle aches/   . Skelaxin [Metaxalone] Other (See Comments)    Cough, SOB  . Sulfonamide Derivatives   . Tolmetin     Other reaction(s): Other (See Comments) Bleeding ulcer  . Zanaflex [Tizanidine Hcl]     dizziness  . Sulfa Antibiotics Nausea Only    Past Medical History:  Diagnosis Date  . Allergy   . Anemia   . Back sprain/strain, thoracic   . Depression   . Diverticulosis   . Gastric ulcer   . Hearing loss   . Hemorrhoids   . Hyperlipidemia   . Hypertension   . IBS (irritable bowel syndrome)   . Lumbar sprain and strain   . Migraines   . Nevus    atypical  . Obesity   .  Osteopenia   . Panic attacks     Past Surgical History:  Procedure Laterality Date  . cataract surgery  2004   bilateral    Social History   Socioeconomic History  . Marital status: Married    Spouse name: Not on file  . Number of children: Not on file  . Years of education: Not on file  . Highest education level: Not on file  Occupational History  . Not on file  Social Needs  . Financial resource strain: Not on file  . Food insecurity:    Worry: Not on file    Inability: Not on file  . Transportation needs:    Medical: Not on file    Non-medical: Not on file  Tobacco Use  . Smoking status: Former Smoker    Last attempt to quit: 05/05/1995    Years since quitting: 22.4  . Smokeless tobacco: Never Used  Substance and Sexual Activity  . Alcohol use: Yes    Alcohol/week: 0.3 oz    Types: 1 drink(s) per week    Comment: per week  .  Drug use: No    Comment: Caffiene: 1 cup of tea  . Sexual activity: Never    Comment: married, 2 children, LMP over 10 yrs ago, walks everyday, daily caffeine.  Lifestyle  . Physical activity:    Days per week: Not on file    Minutes per session: Not on file  . Stress: Not on file  Relationships  . Social connections:    Talks on phone: Not on file    Gets together: Not on file    Attends religious service: Not on file    Active member of club or organization: Not on file    Attends meetings of clubs or organizations: Not on file    Relationship status: Not on file  . Intimate partner violence:    Fear of current or ex partner: Not on file    Emotionally abused: Not on file    Physically abused: Not on file    Forced sexual activity: Not on file  Other Topics Concern  . Not on file  Social History Narrative  . Not on file    Family History  Problem Relation Age of Onset  . Depression Mother   . Diabetes Father   . Hypertension Father   . Stroke Father   . Hypertension Brother     Outpatient Encounter Medications as of  10/28/2017  Medication Sig  . carisoprodol (SOMA) 350 MG tablet TAKE ONE TABLET BY MOUTH THREE TIMES DAILY AS NEEDED FOR MUSCLE SPASM  . clonazePAM (KLONOPIN) 0.5 MG tablet   . dicyclomine (BENTYL) 20 MG tablet take 1 tablet by mouth every 6 hours  . fluticasone (FLONASE) 50 MCG/ACT nasal spray Place 2 sprays into both nostrils daily.  . hydrOXYzine (ATARAX/VISTARIL) 10 MG tablet take 1 tablet by mouth three times a day if needed  . imiquimod (ALDARA) 5 % cream Apply topically.  Marland Kitchen loperamide (IMODIUM A-D) 2 MG tablet Take 2 mg by mouth as needed.    . meloxicam (MOBIC) 15 MG tablet Take 1 tablet (15 mg total) by mouth daily.  . metoprolol tartrate (LOPRESSOR) 25 MG tablet Take 1 tablet (25 mg total) by mouth 2 (two) times daily.  Marland Kitchen nystatin cream (MYCOSTATIN) Apply 1 application topically 2 (two) times daily.  . pravastatin (PRAVACHOL) 20 MG tablet Take 1 tablet (20 mg total) by mouth every 3 (three) days. At bedtime  . promethazine (PHENERGAN) 25 MG tablet Take 1 tablet (25 mg total) by mouth every 6 (six) hours as needed. for nausea  . sertraline (ZOLOFT) 100 MG tablet Take 200 mg by mouth daily.  . traZODone (DESYREL) 100 MG tablet Take 100 mg by mouth at bedtime.  . [DISCONTINUED] enalapril (VASOTEC) 5 MG tablet take 1 tablet by mouth once daily  . [DISCONTINUED] promethazine (PHENERGAN) 25 MG tablet TAKE 1 TABLET BY MOUTH EVERY 6 HOURS AS NEEDED FOR NAUSEA  . [DISCONTINUED] triamcinolone cream (KENALOG) 0.5 % Apply 1 application topically 2 (two) times daily. To affected areas.   No facility-administered encounter medications on file as of 10/28/2017.          Objective:   Physical Exam  Constitutional: She is oriented to person, place, and time. She appears well-developed and well-nourished.  HENT:  Head: Normocephalic and atraumatic.  Eyes: Conjunctivae and EOM are normal.  Cardiovascular: Normal rate.  Pulmonary/Chest: Effort normal.  Neurological: She is alert and oriented to  person, place, and time. Cranial nerve deficit: irritation.  Skin: Skin is dry. No pallor.  On  the right forearm there is a larger brown lesion that is approx 3 cm and in the center there is a papular dry lesion approximately 0.8 cm in size.  Just distal that a little closer to the wrist she has a papular dry brown lesion that is approximately 1/2 cm in size.Marland Kitchen    Psychiatric: She has a normal mood and affect. Her behavior is normal.  Vitals reviewed.      Assessment & Plan:  Seborrheic keratoses on top of a solar lentigo-recommend cryotherapy for definitive treatment.  Second lesion was consistent with seborrheic keratosis as well.  Discussed wound healing and then if it recurs then recommend shave biopsy for further evaluation.  Caregiver burden.  We had a long conversation today about getting her family enrolled in helping her and giving her breaks so that she can get out of the house.  Her husband is getting to the point where he does not want her to leave and he is repeating himself.  She really does not have the time or the ability to get away and go to a support group. Time spent 20 min, greater than 50% of the time his spent counseling about caregiver burden.  Cryotherapy Procedure Note  Pre-operative Diagnosis: Seborrheic keratoses on top of the solar line to go, second lesion seborrheic keratoses.  Post-operative Diagnosis: same  Locations: right Forearm near the wrist.  Indications: irritation   Anesthesia: not required    Procedure Details  Patient informed of risks (permanent scarring, infection, light or dark discoloration, bleeding, infection, weakness, numbness and recurrence of the lesion) and benefits of the procedure and verbal informed consent obtained.  The areas are treated with liquid nitrogen therapy, frozen until ice ball extended 1-2 mm beyond lesion, allowed to thaw, and treated again. The patient tolerated procedure well.  The patient was instructed on post-op  care, warned that there may be blister formation, redness and pain. Recommend OTC analgesia as needed for pain.  Condition: Stable  Complications: none.  Plan: 1. Instructed to keep the area dry and covered for 24-48h and clean thereafter. 2. Warning signs of infection were reviewed.   3. Recommended that the patient use OTC analgesics as needed for pain.  4. Return PRN if lesion return.

## 2017-12-28 ENCOUNTER — Ambulatory Visit: Payer: Medicare Other | Admitting: Family Medicine

## 2017-12-28 ENCOUNTER — Encounter: Payer: Self-pay | Admitting: Family Medicine

## 2017-12-28 VITALS — BP 139/57 | HR 81 | Ht 66.0 in | Wt 186.0 lb

## 2017-12-28 DIAGNOSIS — K2100 Gastro-esophageal reflux disease with esophagitis, without bleeding: Secondary | ICD-10-CM

## 2017-12-28 DIAGNOSIS — Z23 Encounter for immunization: Secondary | ICD-10-CM

## 2017-12-28 DIAGNOSIS — L821 Other seborrheic keratosis: Secondary | ICD-10-CM | POA: Diagnosis not present

## 2017-12-28 MED ORDER — RANITIDINE HCL 150 MG PO TABS: 150.0000 mg | ORAL_TABLET | Freq: Two times a day (BID) | ORAL | 1 refills | Status: DC

## 2017-12-28 NOTE — Progress Notes (Signed)
   Subjective:    Patient ID: Dawn Bailey, female    DOB: 1934/11/26, 83 y.o.   MRN: [id number]  HPI R forearm pt reports that the areas that were treated w/cryo are doing well. she doesn't think the Shave Bx is needed.  Lesions are actually doing really well and have completely healed.  She is very happy with the results.  She did want to discuss her reflux today.  She is been having more symptoms particularly at night.  She says she just got new dentures and is wearing the temporaries and is just really having a hard time chewing and so has been eating a little differently and feels like this is affecting her reflux symptoms.  She will take an occasional Pepto-Bismol and that does seem to help.  But and that can cause some loose stools.    Review of Systems     Objective:   Physical Exam  Constitutional: She is oriented to person, place, and time. She appears well-developed and well-nourished.  HENT:  Head: Normocephalic and atraumatic.  Eyes: Conjunctivae and EOM are normal.  Cardiovascular: Normal rate.  Pulmonary/Chest: Effort normal.  Neurological: She is alert and oriented to person, place, and time.  Skin: Skin is dry. No pallor.  She does have some redness along mouth crease lines.   Psychiatric: She has a normal mood and affect. Her behavior is normal.  Vitals reviewed.       Assessment & Plan:  GERD-reminded her of dietary measures.  We will put her on ranitidine 150 mg twice a day.  May take some adjustment with her dentures for a few weeks.  Seborrheic Keratoses-lesions are well-healed.  She still has a few left on her forearm but the ones that we treated have resolved.  Problem with dentures. - she is taking them back to have then adjusted.

## 2018-01-27 ENCOUNTER — Other Ambulatory Visit: Payer: Self-pay | Admitting: Family Medicine

## 2018-02-03 ENCOUNTER — Ambulatory Visit: Payer: Medicare Other | Admitting: Family Medicine

## 2018-02-03 ENCOUNTER — Encounter: Payer: Self-pay | Admitting: Family Medicine

## 2018-02-03 VITALS — BP 130/67 | HR 81 | Ht 66.0 in | Wt 188.0 lb

## 2018-02-03 DIAGNOSIS — K130 Diseases of lips: Secondary | ICD-10-CM

## 2018-02-03 DIAGNOSIS — R351 Nocturia: Secondary | ICD-10-CM | POA: Diagnosis not present

## 2018-02-03 DIAGNOSIS — G250 Essential tremor: Secondary | ICD-10-CM

## 2018-02-03 LAB — POCT URINALYSIS DIPSTICK
Bilirubin, UA: NEGATIVE
Blood, UA: NEGATIVE
Glucose, UA: NEGATIVE
Ketones, UA: NEGATIVE
Leukocytes, UA: NEGATIVE
Nitrite, UA: NEGATIVE
Protein, UA: NEGATIVE
Spec Grav, UA: 1.01 (ref 1.010–1.025)
Urobilinogen, UA: 0.2 E.U./dL
pH, UA: 6 (ref 5.0–8.0)

## 2018-02-03 MED ORDER — NYSTATIN 100000 UNIT/GM EX CREA: 1.0000 "application " | TOPICAL_CREAM | Freq: Two times a day (BID) | CUTANEOUS | 1 refills | Status: DC

## 2018-02-03 NOTE — Patient Instructions (Signed)

## 2018-02-03 NOTE — Progress Notes (Signed)
Subjective:    Patient ID: Dawn Bailey, female    DOB: 1934/06/10, 83 y.o.   MRN: [id number]  HPI   She has been having nocturia for about 6 weeks.  No burning or hematuria.  Just going more frequently.    She has had a rash on her back, she mostly notices at night.  She will feel some bumps but says she really does not seem to notice them much during the day.  She is also had a rash on the corners of her mouth for several weeks.  She says that she feels like her dentures cause her to retain more moisture in the corners and that then causes irritation.  She is been using Vaseline and a triple antibiotic ointment.  Also had a tremor on and off for years but feels like it has been getting a little worse.  She will notice it ramps up when she is nervous or anxious.  Has noticed a little bit of change in her gait she feels like her balance is off but no shuffling.  She is noticed a small change in her handwriting but overall no major differences.  Rarely drinks alcohol so has not noticed a change in tremor with or without alcohol.  She notices if there has been calls her she will start to get very anxious and her tremor will intensify.  Is not causing any problems with her eating  Review of Systems  BP 130/67   Pulse 81   Ht 5\' 6"  (1.676 m)   Wt 188 lb (85.3 kg)   SpO2 97%   BMI 30.34 kg/m     Allergies  Allergen Reactions  . Aripiprazole     REACTION: nightmares.  . Cephalexin     REACTION: vomiting  . Lipitor [Atorvastatin] Other (See Comments)    Muscle pain  . Nsaids Other (See Comments)    Bleeding ulcer  . Simvastatin Other (See Comments)    Muscle aches/   . Skelaxin [Metaxalone] Other (See Comments)    Cough, SOB  . Sulfonamide Derivatives   . Tolmetin     Other reaction(s): Other (See Comments) Bleeding ulcer  . Zanaflex [Tizanidine Hcl]     dizziness  . Sulfa Antibiotics Nausea Only    Past Medical History:  Diagnosis Date  . Allergy   . Anemia   . Back  sprain/strain, thoracic   . Depression   . Diverticulosis   . Gastric ulcer   . Hearing loss   . Hemorrhoids   . Hyperlipidemia   . Hypertension   . IBS (irritable bowel syndrome)   . Lumbar sprain and strain   . Migraines   . Nevus    atypical  . Obesity   . Osteopenia   . Panic attacks     Past Surgical History:  Procedure Laterality Date  . cataract surgery  2004   bilateral    Social History   Socioeconomic History  . Marital status: Married    Spouse name: Not on file  . Number of children: Not on file  . Years of education: Not on file  . Highest education level: Not on file  Occupational History  . Not on file  Social Needs  . Financial resource strain: Not on file  . Food insecurity:    Worry: Not on file    Inability: Not on file  . Transportation needs:    Medical: Not on file    Non-medical: Not on file  Tobacco Use  . Smoking status: Former Smoker    Last attempt to quit: 05/05/1995    Years since quitting: 22.7  . Smokeless tobacco: Never Used  Substance and Sexual Activity  . Alcohol use: Yes    Alcohol/week: 0.5 standard drinks    Types: 1 drink(s) per week    Comment: per week  . Drug use: No    Comment: Caffiene: 1 cup of tea  . Sexual activity: Never    Comment: married, 2 children, LMP over 10 yrs ago, walks everyday, daily caffeine.  Lifestyle  . Physical activity:    Days per week: Not on file    Minutes per session: Not on file  . Stress: Not on file  Relationships  . Social connections:    Talks on phone: Not on file    Gets together: Not on file    Attends religious service: Not on file    Active member of club or organization: Not on file    Attends meetings of clubs or organizations: Not on file    Relationship status: Not on file  . Intimate partner violence:    Fear of current or ex partner: Not on file    Emotionally abused: Not on file    Physically abused: Not on file    Forced sexual activity: Not on file  Other  Topics Concern  . Not on file  Social History Narrative  . Not on file    Family History  Problem Relation Age of Onset  . Depression Mother   . Diabetes Father   . Hypertension Father   . Stroke Father   . Hypertension Brother     Outpatient Encounter Medications as of 02/03/2018  Medication Sig  . carisoprodol (SOMA) 350 MG tablet TAKE ONE TABLET BY MOUTH THREE TIMES DAILY AS NEEDED FOR MUSCLE SPASM  . clonazePAM (KLONOPIN) 0.5 MG tablet   . dicyclomine (BENTYL) 20 MG tablet take 1 tablet by mouth every 6 hours  . hydrOXYzine (ATARAX/VISTARIL) 10 MG tablet take 1 tablet by mouth three times a day if needed  . loperamide (IMODIUM A-D) 2 MG tablet Take 2 mg by mouth as needed.    . metoprolol tartrate (LOPRESSOR) 25 MG tablet Take 1 tablet (25 mg total) by mouth 2 (two) times daily.  . pravastatin (PRAVACHOL) 20 MG tablet Take 1 tablet (20 mg total) by mouth every 3 (three) days. At bedtime  . promethazine (PHENERGAN) 25 MG tablet TAKE ONE TABLET BY MOUTH EVERY SIX HOURS AS NEEDED FOR NAUSEA   . ranitidine (ZANTAC) 150 MG tablet Take 1 tablet (150 mg total) by mouth 2 (two) times daily.  . sertraline (ZOLOFT) 100 MG tablet Take 200 mg by mouth daily.  . traZODone (DESYREL) 100 MG tablet Take 100 mg by mouth at bedtime.  . [DISCONTINUED] nystatin cream (MYCOSTATIN) Apply 1 application topically 2 (two) times daily.  Marland Kitchen nystatin cream (MYCOSTATIN) Apply 1 application topically 2 (two) times daily.   No facility-administered encounter medications on file as of 02/03/2018.          Objective:   Physical Exam  Constitutional: She is oriented to person, place, and time. She appears well-developed and well-nourished.  HENT:  Head: Normocephalic and atraumatic.  Eyes: Conjunctivae and EOM are normal.  Cardiovascular: Normal rate.  Pulmonary/Chest: Effort normal.  Musculoskeletal:  Both hands tremor when she holds them out but at rest on her lap there is no tremor present.  She has  a little  bit of intention tremor with finger-to-nose but is able to do it quite easily.  She was able to draw circles without any difficulty.  No major gait issues today.  No rash or lesions on her upper back.  She does have some erythema and dry skin along the creases of both sides of her mouth.  No other skin lesions distal to that.  Neurological: She is alert and oriented to person, place, and time.  Skin: Skin is dry. No pallor.  Psychiatric: She has a normal mood and affect. Her behavior is normal.  Vitals reviewed.       Assessment & Plan:  Nocturia -urinalysis is negative for any sign of infection.  Additional handout provided to avoid things like caffeine and avoid excess fluids in the evening before bedtime to see if this reduces frequency of urination at night.  If not then we can always consider overactive bladder medications.  Essential tremor-based on exam and history her tremor is most consistent with an essential tremor we discussed that this is typically a benign tremor that medication can help control symptoms but it does not cure the tremor or make it go away or prevent any progression.  She says she is not at the point where she wants to take a medication for it so we will just continue to monitor it.  Currently the tremor is not affecting any other extremities besides her hands.  Angular chelitis -we will treat with topical nystatin.  She does not have any rash inside of her mouth.  If is not improving over the next week please let me know.  Likely a side effect from excess moisture in the corners from her dentures.  It sounds like she is just getting some buildup of keratin on her back likely keratosis Pilar [place].  And it sounds like she is just sort of scratching at them.  I did not see a distinct rash and it does not look like hives.  Gave reassurance that there is nothing concerning present and try to avoid scratching the areas.

## 2018-02-25 ENCOUNTER — Other Ambulatory Visit: Payer: Self-pay | Admitting: Family Medicine

## 2018-03-03 ENCOUNTER — Telehealth: Payer: Self-pay

## 2018-03-03 ENCOUNTER — Ambulatory Visit: Payer: Self-pay | Admitting: Family Medicine

## 2018-03-03 NOTE — Telephone Encounter (Signed)
Pt calls with complaints of HA, lower abdominal pain on left hand side, low grade fever, and diarrhea. Some nausea but no vomiting. Pt reports she has had diverticulitis before and this does resemble the SX. She feels very weak and lethargic.   Pt was wanting medication called in but I advised her that since she is having fever and complicated SX, it is best for her to be evaluated.   Pt struggles due to lack of transportation and  lack of someone watching her husband with Alzheimers. I have scheduled pt with Dr Georgina Snell at 3:30 to allow her time to contact daughter to try to arrange transportation and care for her husband. FYI to PCP

## 2018-03-03 NOTE — Telephone Encounter (Signed)
Spoke to pt, she was not able to make appt today. She was added to acute spot with Dr Madilyn Fireman tomorrow in efforts to be evaluated. Pt still cannot find anyone to watch her husband nor drive her. Pt states usually her daughter can take care of her, but she has a torn ACL and is not able to at this time.   I advised pt to continue to look for help for transportation and watch for her husband. I advised patient that is someone is able to find someone to help her prior to 2:30 and can get her to Urgent care or ER, to go ahead and be seen and we can cancel appt in office

## 2018-03-03 NOTE — Telephone Encounter (Signed)
I agree.  Needs to be seen for something this concerning.  She could have abscess etc.  I do not feel comfortable just sending antibiotics and over the phone.  Hopefully she will be able to make the appointment.  If not she can call us back first thing in the morning.

## 2018-03-04 ENCOUNTER — Ambulatory Visit: Payer: Self-pay | Admitting: Family Medicine

## 2018-03-04 NOTE — Telephone Encounter (Signed)
Called and left pt VM requesting update on status

## 2018-03-04 NOTE — Telephone Encounter (Signed)
Pt called back- she is going to get someone to come and sit with her husband and she is going to seek care at the emergency room. I have discussed this with Dr Madilyn Fireman and I have removed patient from today's schedule.

## 2018-03-04 NOTE — Telephone Encounter (Signed)
Left message with pt's daughter, Joelene Millin August [phone]) asking her to please return my call to discuss possible ways we can work out mother being seen urgently. Spoke with Dr Madilyn Fireman and at this point, it may be more beneficial to have patient evaluated at ED. Awaiting call back

## 2018-03-09 ENCOUNTER — Encounter: Payer: Self-pay | Admitting: Family Medicine

## 2018-03-09 ENCOUNTER — Ambulatory Visit: Payer: Medicare Other | Admitting: Family Medicine

## 2018-03-09 VITALS — BP 137/63 | HR 89 | Ht 66.0 in | Wt 187.0 lb

## 2018-03-09 DIAGNOSIS — F439 Reaction to severe stress, unspecified: Secondary | ICD-10-CM

## 2018-03-09 DIAGNOSIS — R1032 Left lower quadrant pain: Secondary | ICD-10-CM | POA: Diagnosis not present

## 2018-03-09 DIAGNOSIS — K5732 Diverticulitis of large intestine without perforation or abscess without bleeding: Secondary | ICD-10-CM | POA: Diagnosis not present

## 2018-03-09 MED ORDER — AMOXICILLIN-POT CLAVULANATE 500-125 MG PO TABS: 1.0000 | ORAL_TABLET | Freq: Three times a day (TID) | ORAL | 0 refills | Status: DC
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
## 2018-03-23 ENCOUNTER — Other Ambulatory Visit: Payer: Self-pay | Admitting: Physician Assistant

## 2018-03-23 NOTE — Telephone Encounter (Signed)
Last filled on 02/25/2018. Did you want patient to take daily?

## 2018-03-25 ENCOUNTER — Other Ambulatory Visit: Payer: Self-pay | Admitting: Physician Assistant

## 2018-04-21 ENCOUNTER — Other Ambulatory Visit: Payer: Self-pay | Admitting: Family Medicine

## 2018-04-21 NOTE — Telephone Encounter (Signed)
Requesting RF on Soma   Last OV 03/09/2018  Last RF 03/24/18 for #90, 1 TID PRN, no RF    RX pended, please advise and send if appropriate

## 2018-05-10 ENCOUNTER — Encounter: Payer: Self-pay | Admitting: Family Medicine

## 2018-05-10 ENCOUNTER — Ambulatory Visit (INDEPENDENT_AMBULATORY_CARE_PROVIDER_SITE_OTHER): Payer: Medicare Other | Admitting: Family Medicine

## 2018-05-10 VITALS — BP 136/66 | HR 75 | Ht 66.0 in | Wt 191.0 lb

## 2018-05-10 DIAGNOSIS — M940 Chondrocostal junction syndrome [Tietze]: Secondary | ICD-10-CM | POA: Diagnosis not present

## 2018-05-10 DIAGNOSIS — I10 Essential (primary) hypertension: Secondary | ICD-10-CM | POA: Diagnosis not present

## 2018-05-10 DIAGNOSIS — Z0000 Encounter for general adult medical examination without abnormal findings: Secondary | ICD-10-CM | POA: Diagnosis not present

## 2018-05-10 DIAGNOSIS — E7800 Pure hypercholesterolemia, unspecified: Secondary | ICD-10-CM

## 2018-05-10 MED ORDER — PROMETHAZINE HCL 25 MG PO TABS: ORAL_TABLET | ORAL | 3 refills | Status: DC

## 2018-05-10 NOTE — Progress Notes (Signed)
Subjective:     Dawn Bailey is a 84 y.o. female and is here for a comprehensive physical exam. The patient reports no problems. She has been under a lot of stress lately. Her husband has been very ill and she was unable to take care of at home.  He is now in a skilled nursing facility through the [place] in [place].  Unfortunately their cost is about $5000 a month and she is very worried about how she is going to pay this.  Been putting a lot of pressure on her to pay the bills.  She says she would be unable to make her mortgage payment if she paid this.  Because of her stress and worry she has been not sleeping well at night usually waking up around 3 AM and then not being able to get back to sleep.  She typically sees Dr. Laurance Flatten for her psychiatric medications but says she will not be able to go there for now because of the co-pays and wanted to know if I would be willing to take over her medications.  She is also been having a little discomfort in the left mid chest.  She only notices it really when she is taking the garbage out.  No injury or trauma.  Social History   Socioeconomic History  . Marital status: Married    Spouse name: Not on file  . Number of children: Not on file  . Years of education: Not on file  . Highest education level: Not on file  Occupational History  . Not on file  Social Needs  . Financial resource strain: Not on file  . Food insecurity:    Worry: Not on file    Inability: Not on file  . Transportation needs:    Medical: Not on file    Non-medical: Not on file  Tobacco Use  . Smoking status: Former Smoker    Last attempt to quit: 05/05/1995    Years since quitting: 23.0  . Smokeless tobacco: Never Used  Substance and Sexual Activity  . Alcohol use: Yes    Alcohol/week: 0.5 standard drinks    Types: 1 drink(s) per week    Comment: per week  . Drug use: No    Comment: Caffiene: 1 cup of tea  . Sexual activity: Never    Comment: married, 2 children, LMP  over 10 yrs ago, walks everyday, daily caffeine.  Lifestyle  . Physical activity:    Days per week: Not on file    Minutes per session: Not on file  . Stress: Not on file  Relationships  . Social connections:    Talks on phone: Not on file    Gets together: Not on file    Attends religious service: Not on file    Active member of club or organization: Not on file    Attends meetings of clubs or organizations: Not on file    Relationship status: Not on file  . Intimate partner violence:    Fear of current or ex partner: Not on file    Emotionally abused: Not on file    Physically abused: Not on file    Forced sexual activity: Not on file  Other Topics Concern  . Not on file  Social History Narrative  . Not on file   Health Maintenance  Topic Date Due  . TETANUS/TDAP  11/14/2023  . INFLUENZA VACCINE  Completed  . DEXA SCAN  Completed  . PNA vac Low Risk  Adult  Completed    The following portions of the patient's history were reviewed and updated as appropriate: allergies, current medications, past family history, past medical history, past social history, past surgical history and problem list.  Review of Systems A comprehensive review of systems was negative.   Objective:    BP 136/66   Pulse 75   Ht 5\' 6"  (1.676 m)   Wt 191 lb (86.6 kg)   SpO2 100%   BMI 30.83 kg/m  General appearance: alert, cooperative and appears stated age Head: Normocephalic, without obvious abnormality, atraumatic Eyes: conj clear, EOMI, PEERLA Ears: normal TM's and external ear canals both ears Nose: Nares normal. Septum midline. Mucosa normal. No drainage or sinus tenderness. Throat: lips, mucosa, and tongue normal; teeth and gums normal Neck: no adenopathy, no carotid bruit, no JVD, supple, symmetrical, trachea midline and thyroid not enlarged, symmetric, no tenderness/mass/nodules Back: symmetric, no curvature. ROM normal. No CVA tenderness. Lungs: clear to auscultation  bilaterally Breasts: normal appearance, no masses or tenderness Heart: regular rate and rhythm, S1, S2 normal, no murmur, click, rub or gallop Abdomen: soft, non-tender; bowel sounds normal; no masses,  no organomegaly Extremities: extremities normal, atraumatic, no cyanosis or edema Pulses: 2+ and symmetric Skin: Skin color, texture, turgor normal. No rashes or lesions Lymph nodes: Cervical, supraclavicular, and axillary nodes normal. Neurologic: Alert and oriented X 3, normal strength and tone. Normal symmetric reflexes. Normal coordination and gait    Assessment:    Healthy female exam.      Plan:       See After Visit Summary for Counseling Recommendations   Keep up a regular exercise program and make sure you are eating a healthy diet Try to eat 4 servings of dairy a day, or if you are lactose intolerant take a calcium with vitamin D daily.  Your vaccines are up to date.   Costochondritis-gave reassurance.  It only bothers her with lifting and pushing.  She can certainly use an anti-inflammatory and/or Tylenol as well as heat or ice if needed.  Try to avoid lifting very heavy things if she can make sure that the trash bags are lighter that would actually be helpful.  Due for labs today.  Not fasting so says she will go later this week.  Is low fall risk.  She is no longer doing regular mammograms.  Colonoscopy is up-to-date back in 2011.

## 2018-05-10 NOTE — Patient Instructions (Signed)

## 2018-05-19 ENCOUNTER — Other Ambulatory Visit: Payer: Self-pay | Admitting: Family Medicine

## 2018-05-20 ENCOUNTER — Other Ambulatory Visit: Payer: Self-pay | Admitting: Family Medicine

## 2018-05-20 DIAGNOSIS — E7800 Pure hypercholesterolemia, unspecified: Secondary | ICD-10-CM | POA: Diagnosis not present

## 2018-05-20 DIAGNOSIS — I10 Essential (primary) hypertension: Secondary | ICD-10-CM | POA: Diagnosis not present

## 2018-05-20 LAB — COMPLETE METABOLIC PANEL WITH GFR
AG Ratio: 1.6 (calc) (ref 1.0–2.5)
ALT: 12 U/L (ref 6–29)
AST: 21 U/L (ref 10–35)
Albumin: 4.2 g/dL (ref 3.6–5.1)
Alkaline phosphatase (APISO): 62 U/L (ref 33–130)
BUN: 12 mg/dL (ref 7–25)
CO2: 28 mmol/L (ref 20–32)
Calcium: 9.1 mg/dL (ref 8.6–10.4)
Chloride: 99 mmol/L (ref 98–110)
Creat: 0.71 mg/dL (ref 0.60–0.88)
GFR, Est African American: 93 mL/min/{1.73_m2} (ref >=60)
GFR, Est Non African American: 80 mL/min/{1.73_m2} (ref >=60)
Globulin: 2.6 g/dL (calc) (ref 1.9–3.7)
Glucose, Bld: 86 mg/dL (ref 65–99)
Potassium: 4.3 mmol/L (ref 3.5–5.3)
Sodium: 135 mmol/L (ref 135–146)
Total Bilirubin: 0.4 mg/dL (ref 0.2–1.2)
Total Protein: 6.8 g/dL (ref 6.1–8.1)

## 2018-05-20 LAB — CBC
HCT: 35 % (ref 35.0–45.0)
Hemoglobin: 11.2 g/dL — ABNORMAL LOW (ref 11.7–15.5)
MCH: 24.9 pg — ABNORMAL LOW (ref 27.0–33.0)
MCHC: 32 g/dL (ref 32.0–36.0)
MCV: 78 fL — ABNORMAL LOW (ref 80.0–100.0)
MPV: 13.1 fL — ABNORMAL HIGH (ref 7.5–12.5)
Platelets: 145 10*3/uL (ref 140–400)
RBC: 4.49 10*6/uL (ref 3.80–5.10)
RDW: 14.7 % (ref 11.0–15.0)
WBC: 3.6 10*3/uL — ABNORMAL LOW (ref 3.8–10.8)

## 2018-05-20 LAB — LIPID PANEL
Cholesterol: 260 mg/dL — ABNORMAL HIGH (ref <200)
HDL: 82 mg/dL (ref >50)
LDL Cholesterol (Calc): 148 mg/dL (calc) — ABNORMAL HIGH
Non-HDL Cholesterol (Calc): 178 mg/dL (calc) — ABNORMAL HIGH (ref <130)
Total CHOL/HDL Ratio: 3.2 (calc) (ref <5.0)
Triglycerides: 161 mg/dL — ABNORMAL HIGH (ref <150)

## 2018-05-20 LAB — TSH: TSH: 2.58 mIU/L (ref 0.40–4.50)

## 2018-05-20 NOTE — Telephone Encounter (Signed)
Routing to pcp for signature.Marland KitchenMarland KitchenAudelia Hives [place]

## 2018-07-19 ENCOUNTER — Other Ambulatory Visit: Payer: Self-pay | Admitting: Family Medicine

## 2018-08-09 ENCOUNTER — Ambulatory Visit: Payer: Self-pay | Admitting: Family Medicine

## 2018-08-19 ENCOUNTER — Other Ambulatory Visit: Payer: Self-pay | Admitting: Family Medicine

## 2018-09-07 ENCOUNTER — Encounter: Payer: Self-pay | Admitting: Family Medicine

## 2018-09-07 MED ORDER — ENALAPRIL MALEATE 5 MG PO TABS: 5.0000 mg | ORAL_TABLET | Freq: Every day | ORAL | 0 refills | Status: DC

## 2018-09-07 MED ORDER — TRIAMCINOLONE ACETONIDE 0.5 % EX CREA: 1.0000 "application " | TOPICAL_CREAM | Freq: Two times a day (BID) | CUTANEOUS | 3 refills | Status: DC

## 2018-09-07 NOTE — Telephone Encounter (Signed)
I did go ahead and send medication but let us get her on the schedule for a virtual video visit to follow-up for blood pressure and migraines.  I have not seen her since January and that was for a wellness exam.

## 2018-09-08 ENCOUNTER — Encounter: Payer: Self-pay | Admitting: Family Medicine

## 2018-09-08 ENCOUNTER — Telehealth (INDEPENDENT_AMBULATORY_CARE_PROVIDER_SITE_OTHER): Payer: Medicare Other | Admitting: Family Medicine

## 2018-09-08 VITALS — BP 123/63 | HR 84 | Temp 97.6°F | Ht 66.0 in | Wt 186.0 lb

## 2018-09-08 DIAGNOSIS — E7800 Pure hypercholesterolemia, unspecified: Secondary | ICD-10-CM | POA: Diagnosis not present

## 2018-09-08 DIAGNOSIS — G43009 Migraine without aura, not intractable, without status migrainosus: Secondary | ICD-10-CM

## 2018-09-08 DIAGNOSIS — I10 Essential (primary) hypertension: Secondary | ICD-10-CM | POA: Diagnosis not present

## 2018-09-08 DIAGNOSIS — Z636 Dependent relative needing care at home: Secondary | ICD-10-CM | POA: Diagnosis not present

## 2018-09-08 DIAGNOSIS — F439 Reaction to severe stress, unspecified: Secondary | ICD-10-CM

## 2018-09-08 MED ORDER — PRAVASTATIN SODIUM 20 MG PO TABS: 20.0000 mg | ORAL_TABLET | ORAL | 3 refills | Status: DC

## 2018-09-08 MED ORDER — CARISOPRODOL 350 MG PO TABS: 350.0000 mg | ORAL_TABLET | Freq: Three times a day (TID) | ORAL | 1 refills | Status: DC | PRN

## 2018-09-08 NOTE — Progress Notes (Signed)
Pt reports having a spike in her BP a few weeks ago,but feels that since her medication change she is doing better.Dawn Bailey, Lahoma Crocker, CMA

## 2018-09-08 NOTE — Telephone Encounter (Signed)
Left VM for Pt to return clinic call.  

## 2018-09-08 NOTE — Progress Notes (Signed)
Virtual Visit via Video Note  I connected with Dawn Bailey on 09/08/18 at  1:00 PM EDT by a video enabled telemedicine application and verified that I am speaking with the correct person using two identifiers.   I discussed the limitations of evaluation and management by telemedicine and the availability of in person appointments. The patient expressed understanding and agreed to proceed.   Subjective:    CC: F/U HTN and migraines.   HPI: HTN f/u - she restarted her enalapirl recently b c had noticed her BPs hd been running high around 151/84. She called for a refill. Says er BPs have come back down into the 130s on the medication.  Would like refill sent to the pharmacy.  Migraine HA's - triggered by weather changes and stress and light changes.  Says she has had a few more migraines recently with the shift in weather.  Caregiver burden-she is also been under a lot of stress recently as her husband has been hospitalized.  They are trying to place him in a nursing home but unfortunately with his advancing dementia he is actually quite combative and so they are having a really difficult time placing him.  He is currently in [place] and she says it is really difficult for her to drive that far.  She feels very unsafe and unsure getting on the highway for that extended period of time.  Hyperlipidemia-she recently stopped her statin but says she cannot really remember why.  She had myalgias when she was taking it daily so we had actually decreased her down to every third day.  She says she is willing to restart it.  Past medical history, Surgical history, Family history not pertinant except as noted below, Social history, Allergies, and medications have been entered into the medical record, reviewed, and corrections made.   Review of Systems: No fevers, chills, night sweats, weight loss, chest pain, or shortness of breath.   Objective:    General: Speaking clearly in complete sentences  without any shortness of breath.  Alert and oriented x3.  Normal judgment. No apparent acute distress.  Well-groomed.    Impression and Recommendations:    HTN - Well controlled. Continue current regimen. Follow up in 6 months.  We will refill the enalapril for 90 days.  We will need to check a BMP in 6 months.  Migraines headaches-she takes Soma 3 times a day and has been on it for years.  We discussed multiple times in the past about coming off and weaning off the medication.  I did go ahead and refill it today for 90-day supply sent to her mail order.  Just work on stress reduction and trying to rest.  Caregiver burden-again I know this is been really difficult to try to give her support and reassurance.  She really is at the point where she cannot continue to take care of her husband because he is so combative.  Hopefully they will be able to find a place for him to stay safely. Continue zoloft.    Hyperlipidemia - encouareged her to restart her pravastain.  rx sen to mail order.       I discussed the assessment and treatment plan with the patient. The patient was provided an opportunity to ask questions and all were answered. The patient agreed with the plan and demonstrated an understanding of the instructions.   The patient was advised to call back or seek an in-person evaluation if the symptoms worsen or if the  condition fails to improve as anticipated.   [name], MD

## 2018-09-09 ENCOUNTER — Telehealth: Payer: Self-pay

## 2018-09-09 ENCOUNTER — Other Ambulatory Visit: Payer: Self-pay | Admitting: Family Medicine

## 2018-09-09 ENCOUNTER — Encounter: Payer: Self-pay | Admitting: Family Medicine

## 2018-09-09 MED ORDER — CARISOPRODOL 350 MG PO TABS: 350.0000 mg | ORAL_TABLET | Freq: Three times a day (TID) | ORAL | 1 refills | Status: DC | PRN

## 2018-09-09 NOTE — Telephone Encounter (Signed)
[facility] called and left a message stating she is having a problem with one of her medications. It sounds like she may need a prior authorization.

## 2018-10-20 ENCOUNTER — Encounter: Payer: Self-pay | Admitting: Family Medicine

## 2018-10-20 MED ORDER — IMIQUIMOD 5 % EX CREA: TOPICAL_CREAM | Freq: Every day | CUTANEOUS | 1 refills | Status: DC

## 2018-12-06 ENCOUNTER — Other Ambulatory Visit: Payer: Self-pay | Admitting: Family Medicine

## 2018-12-07 MED ORDER — ENALAPRIL MALEATE 5 MG PO TABS: 5.0000 mg | ORAL_TABLET | Freq: Every day | ORAL | 0 refills | Status: DC

## 2018-12-27 ENCOUNTER — Other Ambulatory Visit: Payer: Self-pay

## 2018-12-27 NOTE — Patient Outreach (Signed)
[facility]) Care Management  12/27/2018  Dawn Bailey 1934/07/26 [id number]   Medication Adherence call to Dawn Bailey Compliant Voice message left with a call back number. Dawn Bailey is showing past due on Pravastatin 20 mg under [name].  [facility] Management Direct Dial [phone]  Fax [phone] Dawn Bailey [name]

## 2019-01-12 ENCOUNTER — Other Ambulatory Visit: Payer: Self-pay

## 2019-01-12 NOTE — Patient Outreach (Signed)
[facility]) Care Management  01/12/2019  YASMINE SEGLER 1934/08/11 [id number]   Medication Adherence call to Mrs. Sherrian Divers Telephone call to Patient regarding Medication Adherence unable to reach patient. Mrs. Ridenhour is showing past due on Pravastatin 20 mg under [place].   [place] Management Direct Dial [phone]  Fax [phone] [email or web address]

## 2019-01-26 ENCOUNTER — Other Ambulatory Visit: Payer: Self-pay | Admitting: *Deleted

## 2019-01-26 MED ORDER — ENALAPRIL MALEATE 5 MG PO TABS: 5.0000 mg | ORAL_TABLET | Freq: Every day | ORAL | 3 refills | Status: DC

## 2019-02-08 ENCOUNTER — Ambulatory Visit (INDEPENDENT_AMBULATORY_CARE_PROVIDER_SITE_OTHER): Payer: Medicare Other | Admitting: Family Medicine

## 2019-02-08 ENCOUNTER — Other Ambulatory Visit: Payer: Self-pay

## 2019-02-08 DIAGNOSIS — Z23 Encounter for immunization: Secondary | ICD-10-CM

## 2019-03-21 ENCOUNTER — Other Ambulatory Visit: Payer: Self-pay | Admitting: Family Medicine

## 2019-06-07 ENCOUNTER — Telehealth: Payer: Self-pay | Admitting: Family Medicine

## 2019-06-07 MED ORDER — CARISOPRODOL 350 MG PO TABS: 350.0000 mg | ORAL_TABLET | Freq: Every day | ORAL | 1 refills | Status: DC | PRN

## 2019-06-07 NOTE — Telephone Encounter (Signed)
Med sent to pharmacy.

## 2019-06-07 NOTE — Telephone Encounter (Signed)
Patient set up a yearly physical with PCP on 06/27/2019 but will need a refill of the medication listed below prior to this visit. Please Advise. AM   carisoprodol (SOMA) 350 MG tablet

## 2019-06-19 ENCOUNTER — Other Ambulatory Visit: Payer: Self-pay

## 2019-06-19 ENCOUNTER — Ambulatory Visit (INDEPENDENT_AMBULATORY_CARE_PROVIDER_SITE_OTHER): Payer: Medicare Other | Admitting: *Deleted

## 2019-06-19 VITALS — BP 120/70 | HR 66 | Ht 66.0 in | Wt 188.0 lb

## 2019-06-19 DIAGNOSIS — Z0000 Encounter for general adult medical examination without abnormal findings: Secondary | ICD-10-CM

## 2019-06-19 NOTE — Progress Notes (Signed)
Subjective:   Dawn Bailey is a 84 y.o. female who presents for Medicare Annual (Subsequent) preventive examination.  Review of Systems:  No ROS.  Medicare Wellness Virtual Visit.  Visual/audio telehealth visit, UTA vital signs.   See social history for additional risk factors.    Cardiac Risk Factors include: advanced age (>63men, >48 women);hypertension;sedentary lifestyle Sleep patterns: Getting 8 hours of sleep a night. Wakes up 3 times a night to void.Wakes up and feels sluggish.  States can not shut her mind off at times.   Home Safety/Smoke Alarms: Feels safe in home. Smoke alarms in place.  Living environment; Lives alone in a 1 story home. No stairs in the home. Shower is a walk in shower and grab bars in place. Seat Belt Safety/Bike Helmet: Wears seat belt.   Female:   Pap-Aged out     Mammo- Aged out      Dexa scan- declined       CCS- Aged out    Objective:     Vitals: BP 120/70   Pulse 66   Ht 5\' 6"  (1.676 m)   Wt 188 lb (85.3 kg)   SpO2 99%   BMI 30.34 kg/m   Body mass index is 30.34 kg/m.  Advanced Directives 06/19/2019 04/08/2017 09/17/2016 12/06/2014 01/01/2014  Does Patient Have a Medical Advance Directive? Yes No;Yes Yes Yes Yes  Type of Advance Directive Living will Living will [place];Living will [place];Living will [place];Living will  Does patient want to make changes to medical advance directive? No - Patient declined - - No - Patient declined -  Copy of [place] in Chart? - - Yes No - copy requested No - copy requested    Tobacco Social History   Tobacco Use  Smoking Status Former Smoker  . Quit date: 05/05/1995  . Years since quitting: 24.1  Smokeless Tobacco Never Used     Counseling given: No   Clinical Intake:  Pre-visit preparation completed: Yes  Pain : 0-10 Pain Score: 2  Pain Type: Chronic pain Pain Location: Back Pain Orientation:  Posterior Pain Radiating Towards: soma helps with the pain and muscles Pain Descriptors / Indicators: Spasm, Throbbing Pain Onset: More than a month ago Pain Frequency: Intermittent Pain Relieving Factors: medication  Pain Relieving Factors: medication  Nutritional Risks: None Diabetes: No  How often do you need to have someone help you when you read instructions, pamphlets, or other written materials from your doctor or pharmacy?: 1 - Never What is the last grade level you completed in school?: 15  Interpreter Needed?: No  Information entered by :: Orlie Dakin, LPN  Past Medical History:  Diagnosis Date  . Allergy   . Anemia   . Back sprain/strain, thoracic   . Depression   . Diverticulosis   . Gastric ulcer   . Hearing loss   . Hemorrhoids   . Hyperlipidemia   . Hypertension   . IBS (irritable bowel syndrome)   . Lumbar sprain and strain   . Migraines   . Nevus    atypical  . Obesity   . Osteopenia   . Panic attacks    Past Surgical History:  Procedure Laterality Date  . cataract surgery  2004   bilateral   Family History  Problem Relation Age of Onset  . Depression Mother   . Diabetes Father   . Hypertension Father   . Stroke Father   . Hypertension  Brother    Social History   Socioeconomic History  . Marital status: Married    Spouse name: Not on file  . Number of children: 2  . Years of education: 28  . Highest education level: Some college, no degree  Occupational History  . Occupation: Press photographer    Comment: retired  Tobacco Use  . Smoking status: Former Smoker    Quit date: 05/05/1995    Years since quitting: 24.1  . Smokeless tobacco: Never Used  Substance and Sexual Activity  . Alcohol use: Yes    Alcohol/week: 0.5 standard drinks    Types: 1 Standard drinks or equivalent per week    Comment: occasionally  . Drug use: No    Comment: Caffiene: 1 cup of tea  . Sexual activity: Not Currently    Comment: married, 2 children, LMP over 10 yrs  ago, walks everyday, daily caffeine.  Other Topics Concern  . Not on file  Social History Narrative   Lives alone   Drinks tea with caffeine in the am   Social Determinants of Health   Financial Resource Strain:   . Difficulty of Paying Living Expenses: Not on file  Food Insecurity:   . Worried About Charity fundraiser in the Last Year: Not on file  . Ran Out of Food in the Last Year: Not on file  Transportation Needs:   . Lack of Transportation (Medical): Not on file  . Lack of Transportation (Non-Medical): Not on file  Physical Activity:   . Days of Exercise per Week: Not on file  . Minutes of Exercise per Session: Not on file  Stress:   . Feeling of Stress : Not on file  Social Connections:   . Frequency of Communication with Friends and Family: Not on file  . Frequency of Social Gatherings with Friends and Family: Not on file  . Attends Religious Services: Not on file  . Active Member of Clubs or Organizations: Not on file  . Attends Archivist Meetings: Not on file  . Marital Status: Not on file    Outpatient Encounter Medications as of 06/19/2019  Medication Sig  . carisoprodol (SOMA) 350 MG tablet Take 1 tablet (350 mg total) by mouth daily as needed. for muscle spams  . clonazePAM (KLONOPIN) 0.5 MG tablet   . enalapril (VASOTEC) 5 MG tablet TAKE 1 TABLET BY MOUTH  DAILY  . hydrOXYzine (ATARAX/VISTARIL) 10 MG tablet take 1 tablet by mouth three times a day if needed  . loperamide (IMODIUM A-D) 2 MG tablet Take 2 mg by mouth as needed.    . pravastatin (PRAVACHOL) 20 MG tablet Take 1 tablet (20 mg total) by mouth every 3 (three) days. At bedtime  . promethazine (PHENERGAN) 25 MG tablet TAKE ONE TABLET BY MOUTH EVERY SIX HOURS AS NEEDED FOR NAUSEA  . sertraline (ZOLOFT) 100 MG tablet Take 200 mg by mouth daily.  . traZODone (DESYREL) 100 MG tablet Take 100 mg by mouth at bedtime.  . dicyclomine (BENTYL) 20 MG tablet take 1 tablet by mouth every 6 hours  (Patient not taking: Reported on 06/19/2019)  . metoprolol tartrate (LOPRESSOR) 25 MG tablet Take 1 tablet (25 mg total) by mouth 2 (two) times daily. (Patient not taking: Reported on 09/08/2018)  . [DISCONTINUED] imiquimod (ALDARA) 5 % cream Apply topically at bedtime. X 6 weeks.  . [DISCONTINUED] nystatin cream (MYCOSTATIN) Apply 1 application topically 2 (two) times daily.  . [DISCONTINUED] triamcinolone cream (KENALOG) 0.5 % Apply  1 application topically 2 (two) times daily. To affected areas. (Patient not taking: Reported on 06/19/2019)   No facility-administered encounter medications on file as of 06/19/2019.    Activities of Daily Living In your present state of health, do you have any difficulty performing the following activities: 06/19/2019  Hearing? Y  Comment has noticed some hearing loss more so in the left  Vision? N  Difficulty concentrating or making decisions? Y  Comment patienet states sometimes and normal with age  Walking or climbing stairs? Y  Comment avoids  Dressing or bathing? N  Doing errands, shopping? N  Preparing Food and eating ? N  Using the Toilet? N  In the past six months, have you accidently leaked urine? Y  Comment leakage when standing up  Do you have problems with loss of bowel control? N  Managing your Medications? N  Managing your Finances? N  Housekeeping or managing your Housekeeping? N  Some recent data might be hidden    Patient Care Team: Hali Marry, MD as PCP - General    Assessment:   This is a routine wellness examination for [place].Physical assessment deferred to PCP.   Exercise Activities and Dietary recommendations Current Exercise Habits: The patient does not participate in regular exercise at present, Exercise limited by: orthopedic condition(s)(balance issues) Diet Eats a fairly healthy diet of vegetables, fruits and some proteins. Eating some processed foods. Breakfast: Oatmeal with yogurt or 2 eggs and OJ Lunch: tuna  fish salad, salad with avacado Dinner: roast beef sandwich , broccoli   Goals    . Patient Stated     Patient stated would like to get out and walk more and be more confident in the distance that she can walk comfortably.       Fall Risk Fall Risk  06/19/2019 05/10/2018 05/10/2018 05/10/2018 04/09/2017  Falls in the past year? 1 0 - 0 Yes  Number falls in past yr: 0 0 - 0 1  Injury with Fall? 0 0 0 - Yes  Risk Factor Category  - - - - -  Risk for fall due to : Impaired balance/gait;Impaired mobility History of fall(s) - - -  Follow up Falls prevention discussed Falls evaluation completed - - Falls prevention discussed   Is the patient's home free of loose throw rugs in walkways, pet beds, electrical cords, etc?   yes      Grab bars in the bathroom? yes      Handrails on the stairs?   no      Adequate lighting?   yes   Depression Screen PHQ 2/9 Scores 06/19/2019 09/08/2018 05/10/2018 10/28/2017  PHQ - 2 Score 1 1 4 6   PHQ- 9 Score - - 13 13  Exception Documentation - - - -     Cognitive Function     6CIT Screen 06/19/2019 05/10/2018  What Year? 0 points 0 points  What month? 0 points 0 points  What time? 0 points 0 points  Count back from 20 0 points 2 points  Months in reverse 0 points 0 points  Repeat phrase 2 points 0 points  Total Score 2 2    Immunization History  Administered Date(s) Administered  . Fluad Quad(high Dose 65+) 02/08/2019  . Influenza Split 02/19/2012  . Influenza Whole 02/04/2006, 03/09/2007, 02/02/2008, 01/24/2010, 01/16/2011  . Influenza, High Dose Seasonal PF 03/16/2016, 03/22/2017  . Influenza,inj,Quad PF,6+ Mos 01/09/2013, 01/01/2014, 02/12/2015, 12/28/2017  . PFIZER SARS-COV-2 Vaccination 05/15/2019, 06/05/2019  . Pneumococcal Conjugate-13 11/13/2013  .  Pneumococcal Polysaccharide-23 01/03/2004  . Td 07/03/2003  . Tdap 11/13/2013    Screening Tests Health Maintenance  Topic Date Due  . TETANUS/TDAP  11/14/2023  . INFLUENZA VACCINE  Completed   . DEXA SCAN  Completed  . PNA vac Low Risk Adult  Completed       Plan:  Please schedule your next medicare wellness visit with me in 1 yr.  Ms. Kever , Thank you for taking time to come for your Medicare Wellness Visit. I appreciate your ongoing commitment to your health goals. Please review the following plan we discussed and let me know if I can assist you in the future.   Continue doing brain stimulating activities (puzzles, reading, adult coloring books, staying active) to keep memory sharp.  If you would like to discuss a medication for bladder please let us know.  Appointment scheduled with MD.  These are the goals we discussed: Goals    . Patient Stated     Patient stated would like to get out and walk more and be more confident in the distance that she can walk comfortably.       This is a list of the screening recommended for you and due dates:  Health Maintenance  Topic Date Due  . Tetanus Vaccine  11/14/2023  . Flu Shot  Completed  . DEXA scan (bone density measurement)  Completed  . Pneumonia vaccines  Completed     I have personally reviewed and noted the following in the patient's chart:   . Medical and social history . Use of alcohol, tobacco or illicit drugs  . Current medications and supplements . Functional ability and status . Nutritional status . Physical activity . Advanced directives . List of other physicians . Hospitalizations, surgeries, and ER visits in previous 12 months . Vitals . Screenings to include cognitive, depression, and falls . Referrals and appointments  In addition, I have reviewed and discussed with patient certain preventive protocols, quality metrics, and best practice recommendations. A written personalized care plan for preventive services as well as general preventive health recommendations were provided to patient.     Joanne Chars, LPN  [id number]

## 2019-06-19 NOTE — Patient Instructions (Addendum)
Please schedule your next medicare wellness visit with me in 1 yr.  Dawn Bailey , Thank you for taking time to come for your Medicare Wellness Visit. I appreciate your ongoing commitment to your health goals. Please review the following plan we discussed and let me know if I can assist you in the future.   Continue doing brain stimulating activities (puzzles, reading, adult coloring books, staying active) to keep memory sharp.  If you would like to discuss a medication for bladder please let us know.  Appointment scheduled with MD.  These are the goals we discussed: Goals    . Patient Stated     Patient stated would like to get out and walk more and be more confident in the distance that she can walk comfortably.

## 2019-06-27 ENCOUNTER — Encounter: Payer: Medicare Other | Admitting: Family Medicine

## 2019-06-27 ENCOUNTER — Telehealth (INDEPENDENT_AMBULATORY_CARE_PROVIDER_SITE_OTHER): Payer: Medicare Other | Admitting: Family Medicine

## 2019-06-27 ENCOUNTER — Encounter: Payer: Self-pay | Admitting: Family Medicine

## 2019-06-27 DIAGNOSIS — R32 Unspecified urinary incontinence: Secondary | ICD-10-CM | POA: Diagnosis not present

## 2019-06-27 DIAGNOSIS — G43009 Migraine without aura, not intractable, without status migrainosus: Secondary | ICD-10-CM | POA: Diagnosis not present

## 2019-06-27 DIAGNOSIS — Z636 Dependent relative needing care at home: Secondary | ICD-10-CM | POA: Diagnosis not present

## 2019-06-27 DIAGNOSIS — R351 Nocturia: Secondary | ICD-10-CM | POA: Diagnosis not present

## 2019-06-27 NOTE — Progress Notes (Signed)
Has been experiencing more urinary leakage. She states that when she gets up too quickly and

## 2019-06-27 NOTE — Assessment & Plan Note (Signed)
She was previously on Soma TID and has been using it mostly BID but we discussed continuing to work on decreasing medication to once a day with the goal of uses very infrequently.

## 2019-06-27 NOTE — Progress Notes (Addendum)
Virtual Visit via Video Note  I connected with Dawn Bailey on 06/28/19 at 10:30 AM EST by a video enabled telemedicine application and verified that I am speaking with the correct person using two identifiers.   I discussed the limitations of evaluation and management by telemedicine and the availability of in person appointments. The patient expressed understanding and agreed to proceed.  Subjective:    CC: Wants to discuss bladder issues.   HPI:   85 year old female complains of occasional incontinence of urination.  She says that sometimes when she sitting and then stands she will leak a little bit.  Or if she sitting in the car and it takes a little longer for her to get home she will sometimes leak.  She says she really does not notice it much with coughing or sneezing it really seems to be position change.  She has never had any prior problems with her bladder.  She denies any blood in the urine.  She does get up about 4-5 times at night to urinate and says that is actually been going on for quite a long time.  No burning with urination.  She also wanted to ask about her Soma.  We had recently refilled her prescription but it was written for once a day instead of twice a day.  She is also been a little bit stressed with her husband who is now on hospice.  She is just having to make some life adjustments and not the little bit of a struggle but she feels like overall she is doing well.  She has not been having a lot of panic attacks which she feels is good.  He unfortunately has a history of Lewy body and has had some more aggressive behaviors.  Past medical history, Surgical history, Family history not pertinant except as noted below, Social history, Allergies, and medications have been entered into the medical record, reviewed, and corrections made.   Review of Systems: No fevers, chills, night sweats, weight loss, chest pain, or shortness of breath.   Objective:    General:  Speaking clearly in complete sentences without any shortness of breath.  Alert and oriented x3.  Normal judgment. No apparent acute distress.    Impression and Recommendations:    Migraine She was previously on Soma TID and has been using it mostly BID but we discussed continuing to work on decreasing medication to once a day with the goal of uses very infrequently.    Nocturia -we will get urinalysis and urine culture just to rule out infection, proteinuria glucosuria etc.  If normal then discussed a trial of an OAB medication such as Myrbetriq.  Urinary incontinence -sounds like her incontinence is somewhat positional it often happens with going from sitting to standing.  There is probably a slight tilt to the bladder that allows a little bit of release of urine.  We can again rule out infection and consider trial of OAB.  It may not help as much with the incontinence as it does with the nocturia that we can see if she is happy with the results.  If not then will consider referral to gynecology urology.  Caregiver burden-just encouraged her to really reach out to hospice.  They have some great resources for caregivers and caregiver stress.  And certainly she can let us know if she would like any assistance from [place] we can always get her set up with a therapist or counselor if she is interested.  Time spent in encounter 25 minutes  I discussed the assessment and treatment plan with the patient. The patient was provided an opportunity to ask questions and all were answered. The patient agreed with the plan and demonstrated an understanding of the instructions.   The patient was advised to call back or seek an in-person evaluation if the symptoms worsen or if the condition fails to improve as anticipated.   [name], MD

## 2019-06-28 NOTE — Addendum Note (Signed)
Addended by: [name] D on: 06/28/2019 06:32 PM   Modules accepted: Level of Service

## 2019-06-29 ENCOUNTER — Encounter: Payer: Self-pay | Admitting: Family Medicine

## 2019-06-29 LAB — URINALYSIS, ROUTINE W REFLEX MICROSCOPIC
Bilirubin Urine: NEGATIVE
Glucose, UA: NEGATIVE
Hgb urine dipstick: NEGATIVE
Ketones, ur: NEGATIVE
Leukocytes,Ua: NEGATIVE
Nitrite: NEGATIVE
Protein, ur: NEGATIVE
Specific Gravity, Urine: 1.014 (ref 1.001–1.03)
pH: 6.5 (ref 5.0–8.0)

## 2019-06-29 LAB — URINE CULTURE
MICRO NUMBER:: 10184458
Result:: NO GROWTH
SPECIMEN QUALITY:: ADEQUATE

## 2019-07-17 ENCOUNTER — Encounter: Payer: Self-pay | Admitting: Family Medicine

## 2019-07-17 DIAGNOSIS — H539 Unspecified visual disturbance: Secondary | ICD-10-CM

## 2019-09-25 ENCOUNTER — Encounter: Payer: Self-pay | Admitting: Family Medicine

## 2019-09-28 ENCOUNTER — Encounter: Payer: Self-pay | Admitting: Family Medicine

## 2019-09-28 ENCOUNTER — Ambulatory Visit (INDEPENDENT_AMBULATORY_CARE_PROVIDER_SITE_OTHER): Payer: Medicare Other | Admitting: Family Medicine

## 2019-09-28 VITALS — BP 140/58 | HR 84 | Ht 66.0 in | Wt 184.0 lb

## 2019-09-28 DIAGNOSIS — G8929 Other chronic pain: Secondary | ICD-10-CM

## 2019-09-28 DIAGNOSIS — Z6829 Body mass index (BMI) 29.0-29.9, adult: Secondary | ICD-10-CM | POA: Diagnosis not present

## 2019-09-28 DIAGNOSIS — R519 Headache, unspecified: Secondary | ICD-10-CM | POA: Diagnosis not present

## 2019-09-28 DIAGNOSIS — T148XXD Other injury of unspecified body region, subsequent encounter: Secondary | ICD-10-CM | POA: Diagnosis not present

## 2019-09-28 DIAGNOSIS — M25511 Pain in right shoulder: Secondary | ICD-10-CM

## 2019-09-28 DIAGNOSIS — F331 Major depressive disorder, recurrent, moderate: Secondary | ICD-10-CM

## 2019-09-28 DIAGNOSIS — I10 Essential (primary) hypertension: Secondary | ICD-10-CM

## 2019-09-28 DIAGNOSIS — M25512 Pain in left shoulder: Secondary | ICD-10-CM

## 2019-09-28 DIAGNOSIS — E7800 Pure hypercholesterolemia, unspecified: Secondary | ICD-10-CM

## 2019-09-28 DIAGNOSIS — M25559 Pain in unspecified hip: Secondary | ICD-10-CM

## 2019-09-28 DIAGNOSIS — R7309 Other abnormal glucose: Secondary | ICD-10-CM | POA: Diagnosis not present

## 2019-09-28 MED ORDER — PRAVASTATIN SODIUM 20 MG PO TABS: 20.0000 mg | ORAL_TABLET | ORAL | 3 refills | Status: DC

## 2019-09-28 NOTE — Patient Instructions (Signed)
Ok to decrease your sertraline to 1.5 tabs daily.

## 2019-09-28 NOTE — Assessment & Plan Note (Signed)
Be happy to take over her psychiatric medications since Dr. Laurance Flatten will no longer be seeing her as a patient.  Regarding go ahead and decrease her sertraline down to 1-1/2 tabs daily which is a total of 150 mg.  She also currently takes 150 mg of trazodone.  We will slowly start to taper that in about a month as well.

## 2019-09-28 NOTE — Progress Notes (Signed)
Established Patient Office Visit  Subjective:  Patient ID: Dawn Bailey, female    DOB: October 06, 1934  Age: 85 y.o. MRN: [id number]  CC:  Chief Complaint  Patient presents with  . Arthritis    HPI Dawn Bailey presents for arthritis.  Having a lot of pain particularly in her shoulders and in her hips lately.  She is got normal range of motion of her shoulders but says it is just more uncomfortable especially that right one when she tries to lay on her side at night.  She does have some Voltaren gel but says she has not used it on her shoulders or hips recently.  She said she had been using it on her low back but she felt like it was causing her to have some loose stools so she actually stopped it.  She also complains of some poor wound healing on her left lower leg she said she had a cut that took a most 2 months to heal so she would like to be checked for diabetes.  She had some upper respiratory symptoms last week but says she is actually feeling a little better this week.  She says that she had body aches, headache and a cough but it seemed to go away on its own.  She did let me know that her husband had passed away since I last saw her.  She says that in some ways it is been a little bit of a relief because he is no longer suffering she was his primary caregiver for quite a long time.  She would also like to get a handicap placard if possible because of her arthritis.  She would mostly use his placard.  In regards to her mood she would like me to take over her medications Dr. Laurance Flatten evidently is getting promoted and is going to be doing more administrative work and so will not be seeing patients.  She is actually been on the same regimen for years and would actually like to start weaning off of some of her medication particularly the sertraline and trazodone as well.  Past Medical History:  Diagnosis Date  . Allergy   . Anemia   . Back sprain/strain, thoracic   . Depression   .  Diverticulosis   . Gastric ulcer   . Hearing loss   . Hemorrhoids   . Hyperlipidemia   . Hypertension   . IBS (irritable bowel syndrome)   . Lumbar sprain and strain   . Migraines   . Nevus    atypical  . Obesity   . Osteopenia   . Panic attacks     Past Surgical History:  Procedure Laterality Date  . cataract surgery  2004   bilateral    Family History  Problem Relation Age of Onset  . Depression Mother   . Diabetes Father   . Hypertension Father   . Stroke Father   . Hypertension Brother     Social History   Socioeconomic History  . Marital status: Married    Spouse name: Not on file  . Number of children: 2  . Years of education: 18  . Highest education level: Some college, no degree  Occupational History  . Occupation: Press photographer    Comment: retired  Tobacco Use  . Smoking status: Former Smoker    Quit date: 05/05/1995    Years since quitting: 24.4  . Smokeless tobacco: Never Used  Substance and Sexual Activity  . Alcohol use: Yes  Alcohol/week: 0.5 standard drinks    Types: 1 Standard drinks or equivalent per week    Comment: occasionally  . Drug use: No    Comment: Caffiene: 1 cup of tea  . Sexual activity: Not Currently    Comment: married, 2 children, LMP over 10 yrs ago, walks everyday, daily caffeine.  Other Topics Concern  . Not on file  Social History Narrative   Lives alone   Drinks tea with caffeine in the am   Social Determinants of Health   Financial Resource Strain:   . Difficulty of Paying Living Expenses:   Food Insecurity:   . Worried About Charity fundraiser in the Last Year:   . Arboriculturist in the Last Year:   Transportation Needs:   . Film/video editor (Medical):   Marland Kitchen Lack of Transportation (Non-Medical):   Physical Activity:   . Days of Exercise per Week:   . Minutes of Exercise per Session:   Stress:   . Feeling of Stress :   Social Connections:   . Frequency of Communication with Friends and Family:   .  Frequency of Social Gatherings with Friends and Family:   . Attends Religious Services:   . Active Member of Clubs or Organizations:   . Attends Archivist Meetings:   Marland Kitchen Marital Status:   Intimate Partner Violence:   . Fear of Current or Ex-Partner:   . Emotionally Abused:   Marland Kitchen Physically Abused:   . Sexually Abused:     Outpatient Medications Prior to Visit  Medication Sig Dispense Refill  . carisoprodol (SOMA) 350 MG tablet Take 1 tablet (350 mg total) by mouth daily as needed. for muscle spams 90 tablet 1  . clonazePAM (KLONOPIN) 0.5 MG tablet     . enalapril (VASOTEC) 5 MG tablet TAKE 1 TABLET BY MOUTH  DAILY 90 tablet 3  . hydrOXYzine (ATARAX/VISTARIL) 10 MG tablet take 1 tablet by mouth three times a day if needed 30 tablet 3  . loperamide (IMODIUM A-D) 2 MG tablet Take 2 mg by mouth as needed.      . metoprolol tartrate (LOPRESSOR) 25 MG tablet Take 1 tablet (25 mg total) by mouth 2 (two) times daily. 180 tablet 1  . promethazine (PHENERGAN) 25 MG tablet TAKE ONE TABLET BY MOUTH EVERY SIX HOURS AS NEEDED FOR NAUSEA 30 tablet 3  . sertraline (ZOLOFT) 100 MG tablet Take 200 mg by mouth daily.    . traZODone (DESYREL) 100 MG tablet Take 100 mg by mouth at bedtime.  0  . pravastatin (PRAVACHOL) 20 MG tablet Take 1 tablet (20 mg total) by mouth every 3 (three) days. At bedtime 30 tablet 3   No facility-administered medications prior to visit.    Allergies  Allergen Reactions  . Nsaids Other (See Comments)    Bleeding ulcer  . Skelaxin [Metaxalone] Other (See Comments)    Cough, SOB  . Tolmetin     Other reaction(s): Other (See Comments) Bleeding ulcer  . Aripiprazole     REACTION: nightmares.  . Cephalexin     REACTION: vomiting  . Lipitor [Atorvastatin] Other (See Comments)    Muscle pain  . Simvastatin Other (See Comments)    Muscle aches/   . Sulfa Antibiotics Nausea Only  . Sulfonamide Derivatives   . Zanaflex [Tizanidine Hcl]     dizziness     ROS Review of Systems    Objective:    Physical Exam  Constitutional: She is  oriented to person, place, and time. She appears well-developed and well-nourished.  HENT:  Head: Normocephalic and atraumatic.  Cardiovascular: Normal rate, regular rhythm and normal heart sounds.  Pulmonary/Chest: Effort normal and breath sounds normal.  Musculoskeletal:     Comments: Right shoulder with NROM.   Neurological: She is alert and oriented to person, place, and time.  Skin: Skin is warm and dry.  Psychiatric: She has a normal mood and affect. Her behavior is normal.    BP (!) 140/58   Pulse 84   Ht 5\' 6"  (1.676 m)   Wt 184 lb (83.5 kg)   SpO2 97%   BMI 29.70 kg/m  Wt Readings from Last 3 Encounters:  09/28/19 184 lb (83.5 kg)  06/19/19 188 lb (85.3 kg)  09/08/18 186 lb (84.4 kg)     There are no preventive care reminders to display for this patient.  There are no preventive care reminders to display for this patient.  Lab Results  Component Value Date   TSH 2.58 05/20/2018   Lab Results  Component Value Date   WBC 3.6 (L) 05/20/2018   HGB 11.2 (L) 05/20/2018   HCT 35.0 05/20/2018   MCV 78.0 (L) 05/20/2018   PLT 145 05/20/2018   Lab Results  Component Value Date   NA 135 05/20/2018   K 4.3 05/20/2018   CO2 28 05/20/2018   GLUCOSE 86 05/20/2018   BUN 12 05/20/2018   CREATININE 0.71 05/20/2018   BILITOT 0.4 05/20/2018   ALKPHOS 70 02/17/2016   AST 21 05/20/2018   ALT 12 05/20/2018   PROT 6.8 05/20/2018   ALBUMIN 4.1 02/17/2016   CALCIUM 9.1 05/20/2018   Lab Results  Component Value Date   CHOL 260 (H) 05/20/2018   Lab Results  Component Value Date   HDL 82 05/20/2018   Lab Results  Component Value Date   LDLCALC 148 (H) 05/20/2018   Lab Results  Component Value Date   TRIG 161 (H) 05/20/2018   Lab Results  Component Value Date   CHOLHDL 3.2 05/20/2018   No results found for: HGBA1C    Assessment & Plan:   Problem List Items Addressed  This Visit      Cardiovascular and Mediastinum   HYPERTENSION, BENIGN SYSTEMIC - Primary (Chronic)    Blood pressure is a little borderline today we will need to keep an eye on that.  It looks great 2 months ago.      Relevant Medications   pravastatin (PRAVACHOL) 20 MG tablet   Other Relevant Orders   COMPLETE METABOLIC PANEL WITH GFR   Lipid panel   CBC   Hemoglobin A1c     Other   Major depressive disorder, recurrent episode, moderate (HCC)    Be happy to take over her psychiatric medications since Dr. Laurance Flatten will no longer be seeing her as a patient.  Regarding go ahead and decrease her sertraline down to 1-1/2 tabs daily which is a total of 150 mg.  She also currently takes 150 mg of trazodone.  We will slowly start to taper that in about a month as well.      HYPERCHOLESTEROLEMIA    To recheck lipid panel.  Its been over a year since we have done that.  She still on pravastatin regularly.      Relevant Medications   pravastatin (PRAVACHOL) 20 MG tablet   Other Relevant Orders   COMPLETE METABOLIC PANEL WITH GFR   Lipid panel   CBC  Hemoglobin A1c   BMI 29.0-29.9,adult    He says her goal is to start working on a little bit of weight loss she thinks it would help with her joint pain particularly her hips.       Other Visit Diagnoses    Chronic nonintractable headache, unspecified headache type       Wound healing, delayed       Chronic pain of both shoulders       Hip pain         Leg wound healing-we will check for diabetes.  Lateral shoulder pain-sounds most consistent with bursitis.  No decreased range of motion but has had pain when she lays on her shoulders.  She most wonders if using her cane sometimes actually aggravates her right shoulder.  Recommend getting her in with sports med and/or Ortho for further evaluation.  Also recommend a trial of Voltaren gel.  If she feels like it is causing her to have loose stools again then please let me know we could  consider a trial of Pennsaid instead.  Meds ordered this encounter  Medications  . pravastatin (PRAVACHOL) 20 MG tablet    Sig: Take 1 tablet (20 mg total) by mouth every 3 (three) days. At bedtime    Dispense:  90 tablet    Refill:  3    Follow-up: Return in about 3 months (around 12/29/2019) for mood medication.    [name], MD

## 2019-09-28 NOTE — Assessment & Plan Note (Signed)
Blood pressure is a little borderline today we will need to keep an eye on that.  It looks great 2 months ago.

## 2019-09-28 NOTE — Assessment & Plan Note (Signed)
To recheck lipid panel.  Its been over a year since we have done that.  She still on pravastatin regularly.

## 2019-09-28 NOTE — Assessment & Plan Note (Signed)
He says her goal is to start working on a little bit of weight loss she thinks it would help with her joint pain particularly her hips.

## 2019-09-29 ENCOUNTER — Encounter: Payer: Self-pay | Admitting: Family Medicine

## 2019-09-29 ENCOUNTER — Other Ambulatory Visit: Payer: Self-pay | Admitting: *Deleted

## 2019-09-29 DIAGNOSIS — D649 Anemia, unspecified: Secondary | ICD-10-CM

## 2019-09-29 LAB — CBC
HCT: 33.7 % — ABNORMAL LOW (ref 35.0–45.0)
Hemoglobin: 11 g/dL — ABNORMAL LOW (ref 11.7–15.5)
MCH: 24.6 pg — ABNORMAL LOW (ref 27.0–33.0)
MCHC: 32.6 g/dL (ref 32.0–36.0)
MCV: 75.2 fL — ABNORMAL LOW (ref 80.0–100.0)
Platelets: 87 10*3/uL — ABNORMAL LOW (ref 140–400)
RBC: 4.48 10*6/uL (ref 3.80–5.10)
RDW: 15.2 % — ABNORMAL HIGH (ref 11.0–15.0)
WBC: 3.3 10*3/uL — ABNORMAL LOW (ref 3.8–10.8)

## 2019-09-29 LAB — COMPLETE METABOLIC PANEL WITH GFR
AG Ratio: 1.9 (calc) (ref 1.0–2.5)
ALT: 12 U/L (ref 6–29)
AST: 19 U/L (ref 10–35)
Albumin: 4.6 g/dL (ref 3.6–5.1)
Alkaline phosphatase (APISO): 65 U/L (ref 37–153)
BUN: 11 mg/dL (ref 7–25)
CO2: 27 mmol/L (ref 20–32)
Calcium: 9.3 mg/dL (ref 8.6–10.4)
Chloride: 98 mmol/L (ref 98–110)
Creat: 0.76 mg/dL (ref 0.60–0.88)
GFR, Est African American: 85 mL/min/{1.73_m2} (ref >=60)
GFR, Est Non African American: 73 mL/min/{1.73_m2} (ref >=60)
Globulin: 2.4 g/dL (calc) (ref 1.9–3.7)
Glucose, Bld: 97 mg/dL (ref 65–99)
Potassium: 4.2 mmol/L (ref 3.5–5.3)
Sodium: 132 mmol/L — ABNORMAL LOW (ref 135–146)
Total Bilirubin: 0.5 mg/dL (ref 0.2–1.2)
Total Protein: 7 g/dL (ref 6.1–8.1)

## 2019-09-29 LAB — LIPID PANEL
Cholesterol: 261 mg/dL — ABNORMAL HIGH (ref <200)
HDL: 72 mg/dL (ref >=50)
LDL Cholesterol (Calc): 163 mg/dL (calc) — ABNORMAL HIGH
Non-HDL Cholesterol (Calc): 189 mg/dL (calc) — ABNORMAL HIGH (ref <130)
Total CHOL/HDL Ratio: 3.6 (calc) (ref <5.0)
Triglycerides: 136 mg/dL (ref <150)

## 2019-09-29 LAB — HEMOGLOBIN A1C
Hgb A1c MFr Bld: 5.1 % of total Hgb (ref <5.7)
Mean Plasma Glucose: 100 (calc)
eAG (mmol/L): 5.5 (calc)

## 2019-10-25 ENCOUNTER — Other Ambulatory Visit: Payer: Self-pay

## 2019-10-25 ENCOUNTER — Encounter: Payer: Self-pay | Admitting: Family Medicine

## 2019-10-25 ENCOUNTER — Ambulatory Visit (INDEPENDENT_AMBULATORY_CARE_PROVIDER_SITE_OTHER): Payer: Medicare Other | Admitting: Family Medicine

## 2019-10-25 VITALS — BP 125/67 | HR 83 | Temp 98.0°F | Ht 66.0 in | Wt 185.0 lb

## 2019-10-25 DIAGNOSIS — R1013 Epigastric pain: Secondary | ICD-10-CM

## 2019-10-25 DIAGNOSIS — R195 Other fecal abnormalities: Secondary | ICD-10-CM

## 2019-10-25 DIAGNOSIS — K921 Melena: Secondary | ICD-10-CM | POA: Diagnosis not present

## 2019-10-25 LAB — CBC
HCT: 31.9 % — ABNORMAL LOW (ref 35.0–45.0)
Hemoglobin: 10.7 g/dL — ABNORMAL LOW (ref 11.7–15.5)
MCH: 25 pg — ABNORMAL LOW (ref 27.0–33.0)
MCHC: 33.5 g/dL (ref 32.0–36.0)
MCV: 74.5 fL — ABNORMAL LOW (ref 80.0–100.0)
Platelets: 90 10*3/uL — ABNORMAL LOW (ref 140–400)
RBC: 4.28 10*6/uL (ref 3.80–5.10)
RDW: 15.5 % — ABNORMAL HIGH (ref 11.0–15.0)
WBC: 3.2 10*3/uL — ABNORMAL LOW (ref 3.8–10.8)

## 2019-10-25 LAB — POC HEMOCCULT BLD/STL (OFFICE/1-CARD/DIAGNOSTIC): Fecal Occult Blood, POC: NEGATIVE

## 2019-10-25 MED ORDER — PANTOPRAZOLE SODIUM 40 MG PO TBEC: 40.0000 mg | DELAYED_RELEASE_TABLET | Freq: Every day | ORAL | 1 refills | Status: DC

## 2019-10-25 MED ORDER — SUCRALFATE 1 G PO TABS: 1.0000 g | ORAL_TABLET | Freq: Three times a day (TID) | ORAL | 0 refills | Status: DC

## 2019-10-25 NOTE — Assessment & Plan Note (Addendum)
Her story is concerning for bleeding ulcer, although hemoccult is negative Platelets quite low on recent labs, checking stat CBC today Start protonix and carafate Stop ibuprofen, tumeric and asa.  Urgent  referral to GI ordered.   Discussed if hgb returns significantly lower I am going to recommend she go to ER.   She is also instructed to go to ER if symptoms significantly worsen.

## 2019-10-25 NOTE — Telephone Encounter (Signed)
Patient called and scheduled.

## 2019-10-25 NOTE — Progress Notes (Addendum)
Dawn Bailey - 85 y.o. female MRN [id number]  Date of birth: Sep 06, 1934  Subjective Chief Complaint  Patient presents with  . dark stools    HPI Dawn Bailey is a 85 y.o. female with history of peptic ulcer here today with complaint of increased fatigue, epigastric pain and dark stools.  She reports that symptoms of epigastric pain and dark stools started a few days ago.  Fatigue has been progressively worsening over the past few weeks.  Epigastric pain improves with eating.  Labs from May with pancytopenia, she has appt with hematology next week for further evaluation of this.  She does admit to recently adding ibuprofen to her medications as she has been having increased pain related to her arthritis.  She is also taking occasional pepto bismol for epigastric pain, 81mg  asa and tumeric.  She denies dizziness or chest pain/tightness, shortness of breath.   ROS:  A comprehensive ROS was completed and negative except as noted per HPI  Allergies  Allergen Reactions  . Nsaids Other (See Comments)    Bleeding ulcer  . Skelaxin [Metaxalone] Other (See Comments)    Cough, SOB  . Tolmetin     Other reaction(s): Other (See Comments) Bleeding ulcer  . Aripiprazole     REACTION: nightmares.  . Cephalexin     REACTION: vomiting  . Lipitor [Atorvastatin] Other (See Comments)    Muscle pain  . Simvastatin Other (See Comments)    Muscle aches/   . Sulfa Antibiotics Nausea Only  . Sulfonamide Derivatives   . Zanaflex [Tizanidine Hcl]     dizziness    Past Medical History:  Diagnosis Date  . Allergy   . Anemia   . Back sprain/strain, thoracic   . Depression   . Diverticulosis   . Gastric ulcer   . Hearing loss   . Hemorrhoids   . Hyperlipidemia   . Hypertension   . IBS (irritable bowel syndrome)   . Lumbar sprain and strain   . Migraines   . Nevus    atypical  . Obesity   . Osteopenia   . Panic attacks     Past Surgical History:  Procedure Laterality Date  . cataract  surgery  2004   bilateral    Social History   Socioeconomic History  . Marital status: Married    Spouse name: Not on file  . Number of children: 2  . Years of education: 63  . Highest education level: Some college, no degree  Occupational History  . Occupation: Press photographer    Comment: retired  Tobacco Use  . Smoking status: Former Smoker    Quit date: 05/05/1995    Years since quitting: 24.4  . Smokeless tobacco: Never Used  Vaping Use  . Vaping Use: Never used  Substance and Sexual Activity  . Alcohol use: Yes    Alcohol/week: 0.5 standard drinks    Types: 1 Standard drinks or equivalent per week    Comment: occasionally  . Drug use: No    Comment: Caffiene: 1 cup of tea  . Sexual activity: Not Currently    Comment: married, 2 children, LMP over 10 yrs ago, walks everyday, daily caffeine.  Other Topics Concern  . Not on file  Social History Narrative   Lives alone   Drinks tea with caffeine in the am   Social Determinants of Health   Financial Resource Strain:   . Difficulty of Paying Living Expenses:   Food Insecurity:   . Worried About  Running Out of Food in the Last Year:   . [place] in the Last Year:   Transportation Needs:   . Lack of Transportation (Medical):   Marland Kitchen Lack of Transportation (Non-Medical):   Physical Activity:   . Days of Exercise per Week:   . Minutes of Exercise per Session:   Stress:   . Feeling of Stress :   Social Connections:   . Frequency of Communication with Friends and Family:   . Frequency of Social Gatherings with Friends and Family:   . Attends Religious Services:   . Active Member of Clubs or Organizations:   . Attends Archivist Meetings:   Marland Kitchen Marital Status:     Family History  Problem Relation Age of Onset  . Depression Mother   . Diabetes Father   . Hypertension Father   . Stroke Father   . Hypertension Brother     Health Maintenance  Topic Date Due  . INFLUENZA VACCINE  12/03/2019  . TETANUS/TDAP   11/14/2023  . DEXA SCAN  Completed  . COVID-19 Vaccine  Completed  . PNA vac Low Risk Adult  Completed     ----------------------------------------------------------------------------------------------------------------------------------------------------------------------------------------------------------------- Physical Exam BP 125/67   Pulse 83   Temp 98 F (36.7 C) (Oral)   Ht 5\' 6"  (1.676 m)   Wt 185 lb (83.9 kg)   SpO2 96% Comment: on RA  BMI 29.86 kg/m   Physical Exam Exam conducted with a chaperone present Jennet Maduro).  Constitutional:      Appearance: Normal appearance.  HENT:     Head: Normocephalic and atraumatic.  Eyes:     General: No scleral icterus. Cardiovascular:     Rate and Rhythm: Normal rate and regular rhythm.  Pulmonary:     Effort: Pulmonary effort is normal.     Breath sounds: Normal breath sounds.  Abdominal:     General: Abdomen is flat. There is no distension.     Palpations: Abdomen is soft.     Tenderness: There is abdominal tenderness (mild epigastric tenderness).  Genitourinary:    Rectum: Normal. Guaiac result negative.  Musculoskeletal:     Cervical back: Neck supple.  Neurological:     General: No focal deficit present.     Mental Status: She is alert.  Psychiatric:        Mood and Affect: Mood normal.        Behavior: Behavior normal.     ------------------------------------------------------------------------------------------------------------------------------------------------------------------------------------------------------------------- Assessment and Plan  Epigastric pain Her story is concerning for bleeding ulcer, although hemoccult is negative Platelets quite low on recent labs, checking stat CBC today Start protonix and carafate Stop ibuprofen, tumeric and asa.  Urgent  referral to GI ordered.   Discussed if hgb returns significantly lower I am going to recommend she go to ER.   She is also instructed to  go to ER if symptoms significantly worsen.    Meds ordered this encounter  Medications  . pantoprazole (PROTONIX) 40 MG tablet    Sig: Take 1 tablet (40 mg total) by mouth daily.    Dispense:  30 tablet    Refill:  1  . sucralfate (CARAFATE) 1 g tablet    Sig: Take 1 tablet (1 g total) by mouth with breakfast, with lunch, and with evening meal. For 1 week    Dispense:  21 tablet    Refill:  0    No follow-ups on file.    This visit occurred during the SARS-CoV-2 public  health emergency.  Safety protocols were in place, including screening questions prior to the visit, additional usage of staff PPE, and extensive cleaning of exam room while observing appropriate contact time as indicated for disinfecting solutions.

## 2019-10-25 NOTE — Patient Instructions (Signed)
Please stop aspirin and ibuprofen for now.  Start protonix daily  Start carafate with meals for the next week.  We'll be in touch with lab results and further instructions.

## 2019-10-26 ENCOUNTER — Encounter: Payer: Self-pay | Admitting: Gastroenterology

## 2019-11-01 ENCOUNTER — Other Ambulatory Visit: Payer: Self-pay | Admitting: Family

## 2019-11-01 DIAGNOSIS — D649 Anemia, unspecified: Secondary | ICD-10-CM

## 2019-11-02 ENCOUNTER — Inpatient Hospital Stay: Payer: Medicare Other

## 2019-11-02 ENCOUNTER — Other Ambulatory Visit: Payer: Self-pay

## 2019-11-02 ENCOUNTER — Inpatient Hospital Stay: Payer: Medicare Other | Attending: Family | Admitting: Family

## 2019-11-02 ENCOUNTER — Encounter: Payer: Self-pay | Admitting: Family

## 2019-11-02 DIAGNOSIS — K5793 Diverticulitis of intestine, part unspecified, without perforation or abscess with bleeding: Secondary | ICD-10-CM | POA: Diagnosis not present

## 2019-11-02 DIAGNOSIS — D696 Thrombocytopenia, unspecified: Secondary | ICD-10-CM | POA: Insufficient documentation

## 2019-11-02 DIAGNOSIS — Z7289 Other problems related to lifestyle: Secondary | ICD-10-CM | POA: Insufficient documentation

## 2019-11-02 DIAGNOSIS — K254 Chronic or unspecified gastric ulcer with hemorrhage: Secondary | ICD-10-CM | POA: Insufficient documentation

## 2019-11-02 DIAGNOSIS — D649 Anemia, unspecified: Secondary | ICD-10-CM

## 2019-11-02 DIAGNOSIS — Z87891 Personal history of nicotine dependence: Secondary | ICD-10-CM | POA: Diagnosis not present

## 2019-11-02 DIAGNOSIS — D500 Iron deficiency anemia secondary to blood loss (chronic): Secondary | ICD-10-CM | POA: Insufficient documentation

## 2019-11-02 LAB — CBC WITH DIFFERENTIAL (CANCER CENTER ONLY)
Abs Immature Granulocytes: 0.05 10*3/uL (ref 0.00–0.07)
Basophils Absolute: 0 10*3/uL (ref 0.0–0.1)
Basophils Relative: 0 %
Eosinophils Absolute: 0 10*3/uL (ref 0.0–0.5)
Eosinophils Relative: 0 %
HCT: 32.1 % — ABNORMAL LOW (ref 36.0–46.0)
Hemoglobin: 10.3 g/dL — ABNORMAL LOW (ref 12.0–15.0)
Immature Granulocytes: 1 %
Lymphocytes Relative: 36 %
Lymphs Abs: 1.3 10*3/uL (ref 0.7–4.0)
MCH: 24.4 pg — ABNORMAL LOW (ref 26.0–34.0)
MCHC: 32.1 g/dL (ref 30.0–36.0)
MCV: 76.1 fL — ABNORMAL LOW (ref 80.0–100.0)
Monocytes Absolute: 0.3 10*3/uL (ref 0.1–1.0)
Monocytes Relative: 8 %
Neutro Abs: 2.1 10*3/uL (ref 1.7–7.7)
Neutrophils Relative %: 55 %
Platelet Count: 94 10*3/uL — ABNORMAL LOW (ref 150–400)
RBC: 4.22 MIL/uL (ref 3.87–5.11)
RDW: 15.9 % — ABNORMAL HIGH (ref 11.5–15.5)
WBC Count: 3.7 10*3/uL — ABNORMAL LOW (ref 4.0–10.5)
nRBC: 0 % (ref 0.0–0.2)

## 2019-11-02 LAB — CMP (CANCER CENTER ONLY)
ALT: 10 U/L (ref 0–44)
AST: 17 U/L (ref 15–41)
Albumin: 4.5 g/dL (ref 3.5–5.0)
Alkaline Phosphatase: 63 U/L (ref 38–126)
Anion gap: 6 (ref 5–15)
BUN: 8 mg/dL (ref 8–23)
CO2: 29 mmol/L (ref 22–32)
Calcium: 9.2 mg/dL (ref 8.9–10.3)
Chloride: 99 mmol/L (ref 98–111)
Creatinine: 0.7 mg/dL (ref 0.44–1.00)
GFR, Est AFR Am: >60 mL/min (ref >60)
GFR, Estimated: >60 mL/min (ref >60)
Glucose, Bld: 97 mg/dL (ref 70–99)
Potassium: 3.6 mmol/L (ref 3.5–5.1)
Sodium: 134 mmol/L — ABNORMAL LOW (ref 135–145)
Total Bilirubin: 0.4 mg/dL (ref 0.3–1.2)
Total Protein: 7.2 g/dL (ref 6.5–8.1)

## 2019-11-02 LAB — RETICULOCYTES
Immature Retic Fract: 5.5 % (ref 2.3–15.9)
RBC.: 4.26 MIL/uL (ref 3.87–5.11)
Retic Count, Absolute: 37.1 10*3/uL (ref 19.0–186.0)
Retic Ct Pct: 0.9 % (ref 0.4–3.1)

## 2019-11-02 LAB — LACTATE DEHYDROGENASE: LDH: 226 U/L — ABNORMAL HIGH (ref 98–192)

## 2019-11-02 LAB — SAMPLE TO BLOOD BANK

## 2019-11-02 LAB — FOLATE: Folate: 22.2 ng/mL (ref >5.9)

## 2019-11-02 LAB — VITAMIN B12: Vitamin B-12: 635 pg/mL (ref 180–914)

## 2019-11-02 NOTE — Progress Notes (Signed)
Hematology/Oncology Consultation   Name: Dawn Bailey      MRN: [id number]    Location: Room/bed info not found  Date: 11/02/2019 Time:11:38 AM   REFERRING PHYSICIAN: [name], MD  REASON FOR CONSULT: Low Hgb   DIAGNOSIS: Anemia - lab work up pending   HISTORY OF PRESENT ILLNESS: Dawn Bailey is a very pleasant 85 yo caucasian female with a long history of intermittent anemia secondary to GI ulcer and diverticulitis flares.  She has also had thrombocytopenia and neutropenia over the last year or so.  Hgb today is 10.3, MCV 76, WBC count 3.7 and platelets 94.  She states that she had to be transfused 3 separate times due to GI blood loss with ulcers.  She is symptomatic with fatigue, dizziness when she stands and palpitations.  She has nausea occasionally but no vomiting. She is currently on Protonix and states that she just finished treatment with Carafate.  She states that they checked her stool in the doctors office last week and it was negative for blood.  She has a new patient appointment with Dawn Bailey in August to address epigastric pain and dark tarry stools/possible GI blood loss.  She has not noted any obvious blood loss. No bruising or petechiae.  She states that her mother, maternal aunts and daughter all have history of anemia as well.  She denies any issues with frequent infection. No fever, chills, cough, rash, SOB, chest pain, abdominal pain or changes in bowel or bladder habits.  She has history of urinary frequency.  Female organs and spleen are still in place.  She has 2 children and no history of miscarriage.  She lost her husband in March.  She states that she has night sweats when she has night mares on occasion.  She states that she has had multiple surgeries in the past without any complications.  No swelling or tenderness in her extremities.  She has intermittent numbness and tingling in her hands and feet.  No falls or syncopal episodes to report. She  ambulates with a cane for added support.  No diabetes or thyroid disease.  No family history of cancer that she is aware of. She has had asking cancer removed in the past but states these were basal cell carcinomas and not melanoma.  She does not smoke, drink alcoholic beverages or use recreational drugs.  She has maintained a good appetite and is staying well hydrated. She states that her weight is stable.  She is originally from [place].   ROS: All other 10 point review of systems is negative.   PAST MEDICAL HISTORY:   Past Medical History:  Diagnosis Date  . Allergy   . Anemia   . Back sprain/strain, thoracic   . Depression   . Diverticulosis   . Gastric ulcer   . Hearing loss   . Hemorrhoids   . Hyperlipidemia   . Hypertension   . IBS (irritable bowel syndrome)   . Lumbar sprain and strain   . Migraines   . Nevus    atypical  . Obesity   . Osteopenia   . Panic attacks     ALLERGIES: Allergies  Allergen Reactions  . Aripiprazole Other (See Comments)    REACTION: nightmares.  . Nsaids Other (See Comments)    Bleeding ulcer  . Skelaxin [Metaxalone] Other (See Comments)    Cough, SOB  . Tolmetin Other (See Comments)    Bleeding ulcer  . Cephalexin Nausea And Vomiting  .  Lipitor [Atorvastatin] Other (See Comments)    Muscle pain  . Simvastatin Other (See Comments)    Muscle aches/   . Sulfa Antibiotics Nausea Only  . Sulfonamide Derivatives Nausea Only  . Zanaflex [Tizanidine Hcl] Other (See Comments)    dizziness      MEDICATIONS:  Current Outpatient Medications on File Prior to Visit  Medication Sig Dispense Refill  . carisoprodol (SOMA) 350 MG tablet Take 1 tablet (350 mg total) by mouth daily as needed. for muscle spams 90 tablet 1  . clonazePAM (KLONOPIN) 0.5 MG tablet     . Coenzyme Q10 (COQ10) 100 MG CAPS Take 300 mg by mouth daily.    . enalapril (VASOTEC) 5 MG tablet TAKE 1 TABLET BY MOUTH  DAILY 90 tablet 3  . hydrOXYzine (ATARAX/VISTARIL) 10  MG tablet take 1 tablet by mouth three times a day if needed 30 tablet 3  . loperamide (IMODIUM A-D) 2 MG tablet Take 2 mg by mouth as needed.      . metoprolol tartrate (LOPRESSOR) 25 MG tablet Take 1 tablet (25 mg total) by mouth 2 (two) times daily. 180 tablet 1  . pantoprazole (PROTONIX) 40 MG tablet Take 1 tablet (40 mg total) by mouth daily. 30 tablet 1  . pravastatin (PRAVACHOL) 20 MG tablet Take 1 tablet (20 mg total) by mouth every 3 (three) days. At bedtime 90 tablet 3  . promethazine (PHENERGAN) 25 MG tablet TAKE ONE TABLET BY MOUTH EVERY SIX HOURS AS NEEDED FOR NAUSEA 30 tablet 3  . sertraline (ZOLOFT) 100 MG tablet Take 200 mg by mouth daily.    . sucralfate (CARAFATE) 1 g tablet Take 1 tablet (1 g total) by mouth with breakfast, with lunch, and with evening meal. For 1 week 21 tablet 0  . traZODone (DESYREL) 100 MG tablet Take 100 mg by mouth at bedtime.  0   No current facility-administered medications on file prior to visit.     PAST SURGICAL HISTORY Past Surgical History:  Procedure Laterality Date  . cataract surgery  2004   bilateral    FAMILY HISTORY: Family History  Problem Relation Age of Onset  . Depression Mother   . Diabetes Father   . Hypertension Father   . Stroke Father   . Hypertension Brother     SOCIAL HISTORY:  reports that she quit smoking about 24 years ago. She has never used smokeless tobacco. She reports current alcohol use of about 0.5 standard drinks of alcohol per week. She reports that she does not use drugs.  PERFORMANCE STATUS: The patient's performance status is 1 - Symptomatic but completely ambulatory  PHYSICAL EXAM: Most Recent Vital Signs: There were no vitals taken for this visit. There were no vitals taken for this visit.  General Appearance:    Alert, cooperative, no distress, appears stated age  Head:    Normocephalic, without obvious abnormality, atraumatic  Eyes:    PERRL, conjunctiva/corneas clear, EOM's intact, fundi     benign, both eyes        Throat:   Lips, mucosa, and tongue normal; teeth and gums normal  Neck:   Supple, symmetrical, trachea midline, no adenopathy;    thyroid:  no enlargement/tenderness/nodules; no carotid   bruit or JVD  Back:     Symmetric, no curvature, ROM normal, no CVA tenderness  Lungs:     Clear to auscultation bilaterally, respirations unlabored  Chest Wall:    No tenderness or deformity   Heart:  Regular rate and rhythm, S1 and S2 normal, no murmur, rub   or gallop     Abdomen:     Soft, non-tender, bowel sounds active all four quadrants,    no masses, no organomegaly        Extremities:   Extremities normal, atraumatic, no cyanosis or edema  Pulses:   2+ and symmetric all extremities  Skin:   Skin color, texture, turgor normal, no rashes or lesions  Lymph nodes:   Cervical, supraclavicular, and axillary nodes normal  Neurologic:   CNII-XII intact, normal strength, sensation and reflexes    throughout    LABORATORY DATA:  Results for orders placed or performed in visit on 11/02/19 (from the past 48 hour(s))  CMP ([place] only)     Status: Abnormal   Collection Time: 11/02/19 10:34 AM  Result Value Ref Range   Sodium 134 (L) 135 - 145 mmol/L   Potassium 3.6 3.5 - 5.1 mmol/L   Chloride 99 98 - 111 mmol/L   CO2 29 22 - 32 mmol/L   Glucose, Bld 97 70 - 99 mg/dL    Comment: Glucose reference range applies only to samples taken after fasting for at least 8 hours.   BUN 8 8 - 23 mg/dL   Creatinine 0.70 0.44 - 1.00 mg/dL   Calcium 9.2 8.9 - 10.3 mg/dL   Total Protein 7.2 6.5 - 8.1 g/dL   Albumin 4.5 3.5 - 5.0 g/dL   AST 17 15 - 41 U/L   ALT 10 0 - 44 U/L   Alkaline Phosphatase 63 38 - 126 U/L   Total Bilirubin 0.4 0.3 - 1.2 mg/dL   GFR, Est Non Af Am >60 >60 mL/min   GFR, Est AFR Am >60 >60 mL/min   Anion gap 6 5 - 15    Comment: Performed at [facility] at [facility], [address] [place], [place] [address]  CBC with  Differential ([place] Only)     Status: Abnormal   Collection Time: 11/02/19 10:34 AM  Result Value Ref Range   WBC Count 3.7 (L) 4.0 - 10.5 K/uL   RBC 4.22 3.87 - 5.11 MIL/uL   Hemoglobin 10.3 (L) 12.0 - 15.0 g/dL    Comment: Reticulocyte Hemoglobin testing may be clinically indicated, consider ordering this additional test [id number]    HCT 32.1 (L) 36 - 46 %   MCV 76.1 (L) 80.0 - 100.0 fL   MCH 24.4 (L) 26.0 - 34.0 pg   MCHC 32.1 30.0 - 36.0 g/dL   RDW 15.9 (H) 11.5 - 15.5 %   Platelet Count 94 (L) 150 - 400 K/uL   nRBC 0.0 0.0 - 0.2 %   Neutrophils Relative % 55 %   Neutro Abs 2.1 1.7 - 7.7 K/uL   Lymphocytes Relative 36 %   Lymphs Abs 1.3 0.7 - 4.0 K/uL   Monocytes Relative 8 %   Monocytes Absolute 0.3 0 - 1 K/uL   Eosinophils Relative 0 %   Eosinophils Absolute 0.0 0 - 0 K/uL   Basophils Relative 0 %   Basophils Absolute 0.0 0 - 0 K/uL   Immature Granulocytes 1 %   Abs Immature Granulocytes 0.05 0.00 - 0.07 K/uL    Comment: Performed at [facility] Lab at [facility], [address], [place], [place] [address]  Reticulocytes     Status: None   Collection Time: 11/02/19 10:35 AM  Result Value  Ref Range   Retic Ct Pct 0.9 0.4 - 3.1 %   RBC. 4.26 3.87 - 5.11 MIL/uL   Retic Count, Absolute 37.1 19.0 - 186.0 K/uL   Immature Retic Fract 5.5 2.3 - 15.9 %    Comment: Performed at [facility] Lab at [facility], [address], [place], [place] [address]      RADIOGRAPHY: No results found.     PATHOLOGY: None   ASSESSMENT/PLAN: Ms. Centanni is a very pleasant 85 yo caucasian female with a long history of intermittent anemia secondary to GI ulcer and diverticulitis flares.  She may also have an element of myelodysplasia with the low platelet count and WBC count.  Iron studies and epo level are pending.  We will see what her lab work shows and determine a treatment plan and follow-up.   All questions were answered and she  is in agreement with the plan. She can contact our office with questions or concerns. We can certainly see her sooner if needed.   She was discussed with and also seen by Dr. Marin Olp and he is in agreement with the aforementioned.   Laverna Peace, NP   ADDENDUM: I saw examined Ms. Norman Herrlich with [facility].  She is very very nice.  She is originally from [place].  She has iron deficiency anemia at least, by her blood smear.  I looked at her blood smear with the microscope.  She did have some microcytic red blood cells.  She has some anisocytosis and poikilocytosis.  There is no nucleated red blood cells.  I see no teardrop cells.  There is no rouleaux formation.  White blood cells appear normal in morphology maturation.  There are no hypersegmented polys.  Platelets are decreased in number.  She has a few large platelets.  I am little bit interested in the fact that she has this thrombocytopenia.  A year and a half ago, her platelet count was normal.  Given her age, one would have to suspect that there might be an element of myelodysplasia.  The only way to prove this would be with a bone marrow biopsy.  I really do not think we have to put her through that right now.  It will be interesting to see what her platelet count does when the iron is given back to her.  Her iron studies show a ferritin of 51 with an iron saturation of 14%.  This definitely is quite low.  We had a very nice time with Ms. Norman Herrlich.  We spent about 45 minutes with her today.  We went over her lab work.  We made our recommendations.  We went over IV iron and why this would be reasonable.  Again I think it will be very interesting to see what happens with her platelet count with iron replacement.  I would like to see her back in about 4 or 5 weeks.  By that point, she should have replacement of her iron.  We can then see how her other blood counts look.  Lattie Haw, MD

## 2019-11-03 ENCOUNTER — Encounter: Payer: Self-pay | Admitting: Family

## 2019-11-03 ENCOUNTER — Other Ambulatory Visit: Payer: Self-pay | Admitting: Family

## 2019-11-03 DIAGNOSIS — D500 Iron deficiency anemia secondary to blood loss (chronic): Secondary | ICD-10-CM

## 2019-11-03 HISTORY — DX: Iron deficiency anemia secondary to blood loss (chronic): D50.0

## 2019-11-03 LAB — IRON AND TIBC
Iron: 49 ug/dL (ref 41–142)
Saturation Ratios: 14 % — ABNORMAL LOW (ref 21–57)
TIBC: 349 ug/dL (ref 236–444)
UIBC: 300 ug/dL (ref 120–384)

## 2019-11-03 LAB — FERRITIN: Ferritin: 51 ng/mL (ref 11–307)

## 2019-11-03 LAB — ERYTHROPOIETIN: Erythropoietin: 14.1 m[IU]/mL (ref 2.6–18.5)

## 2019-11-10 ENCOUNTER — Encounter: Payer: Self-pay | Admitting: Family Medicine

## 2019-11-13 ENCOUNTER — Inpatient Hospital Stay: Payer: Medicare Other

## 2019-11-13 ENCOUNTER — Other Ambulatory Visit: Payer: Self-pay

## 2019-11-13 VITALS — BP 116/63 | HR 70 | Temp 98.1°F | Resp 17

## 2019-11-13 DIAGNOSIS — Z87891 Personal history of nicotine dependence: Secondary | ICD-10-CM | POA: Diagnosis not present

## 2019-11-13 DIAGNOSIS — K254 Chronic or unspecified gastric ulcer with hemorrhage: Secondary | ICD-10-CM | POA: Diagnosis not present

## 2019-11-13 DIAGNOSIS — D500 Iron deficiency anemia secondary to blood loss (chronic): Secondary | ICD-10-CM | POA: Diagnosis not present

## 2019-11-13 DIAGNOSIS — K5793 Diverticulitis of intestine, part unspecified, without perforation or abscess with bleeding: Secondary | ICD-10-CM | POA: Diagnosis not present

## 2019-11-13 DIAGNOSIS — D696 Thrombocytopenia, unspecified: Secondary | ICD-10-CM | POA: Diagnosis not present

## 2019-11-13 DIAGNOSIS — Z7289 Other problems related to lifestyle: Secondary | ICD-10-CM | POA: Diagnosis not present

## 2019-11-13 MED ORDER — SODIUM CHLORIDE 0.9 % IV SOLN
200.0000 mg | Freq: Once | INTRAVENOUS | Status: AC
  Administered 2019-11-13: 200 mg via INTRAVENOUS
  Filled 2019-11-13: qty 200

## 2019-11-13 MED ORDER — SODIUM CHLORIDE 0.9 % IV SOLN
Freq: Once | INTRAVENOUS | Status: AC
  Filled 2019-11-13: qty 250

## 2019-11-13 NOTE — Patient Instructions (Signed)

## 2019-11-14 DIAGNOSIS — H26492 Other secondary cataract, left eye: Secondary | ICD-10-CM | POA: Diagnosis not present

## 2019-11-14 DIAGNOSIS — Z961 Presence of intraocular lens: Secondary | ICD-10-CM | POA: Diagnosis not present

## 2019-11-14 DIAGNOSIS — H527 Unspecified disorder of refraction: Secondary | ICD-10-CM | POA: Diagnosis not present

## 2019-11-14 DIAGNOSIS — H40003 Preglaucoma, unspecified, bilateral: Secondary | ICD-10-CM | POA: Diagnosis not present

## 2019-11-14 DIAGNOSIS — H26493 Other secondary cataract, bilateral: Secondary | ICD-10-CM | POA: Diagnosis not present

## 2019-11-17 ENCOUNTER — Other Ambulatory Visit: Payer: Self-pay

## 2019-11-17 ENCOUNTER — Inpatient Hospital Stay: Payer: Medicare Other

## 2019-11-17 VITALS — BP 120/46 | HR 69 | Temp 98.3°F | Resp 17

## 2019-11-17 DIAGNOSIS — Z7289 Other problems related to lifestyle: Secondary | ICD-10-CM | POA: Diagnosis not present

## 2019-11-17 DIAGNOSIS — K5793 Diverticulitis of intestine, part unspecified, without perforation or abscess with bleeding: Secondary | ICD-10-CM | POA: Diagnosis not present

## 2019-11-17 DIAGNOSIS — D500 Iron deficiency anemia secondary to blood loss (chronic): Secondary | ICD-10-CM | POA: Diagnosis not present

## 2019-11-17 DIAGNOSIS — D696 Thrombocytopenia, unspecified: Secondary | ICD-10-CM | POA: Diagnosis not present

## 2019-11-17 DIAGNOSIS — K254 Chronic or unspecified gastric ulcer with hemorrhage: Secondary | ICD-10-CM | POA: Diagnosis not present

## 2019-11-17 DIAGNOSIS — Z87891 Personal history of nicotine dependence: Secondary | ICD-10-CM | POA: Diagnosis not present

## 2019-11-17 MED ORDER — SODIUM CHLORIDE 0.9 % IV SOLN
Freq: Once | INTRAVENOUS | Status: AC
  Filled 2019-11-17: qty 250

## 2019-11-17 MED ORDER — SODIUM CHLORIDE 0.9 % IV SOLN
200.0000 mg | Freq: Once | INTRAVENOUS | Status: AC
  Administered 2019-11-17: 200 mg via INTRAVENOUS
  Filled 2019-11-17: qty 200

## 2019-11-17 NOTE — Patient Instructions (Signed)

## 2019-11-20 ENCOUNTER — Other Ambulatory Visit: Payer: Self-pay

## 2019-11-20 ENCOUNTER — Inpatient Hospital Stay: Payer: Medicare Other

## 2019-11-20 VITALS — BP 131/61 | HR 72 | Temp 98.4°F | Resp 17

## 2019-11-20 DIAGNOSIS — K5793 Diverticulitis of intestine, part unspecified, without perforation or abscess with bleeding: Secondary | ICD-10-CM | POA: Diagnosis not present

## 2019-11-20 DIAGNOSIS — D500 Iron deficiency anemia secondary to blood loss (chronic): Secondary | ICD-10-CM | POA: Diagnosis not present

## 2019-11-20 DIAGNOSIS — Z87891 Personal history of nicotine dependence: Secondary | ICD-10-CM | POA: Diagnosis not present

## 2019-11-20 DIAGNOSIS — K254 Chronic or unspecified gastric ulcer with hemorrhage: Secondary | ICD-10-CM | POA: Diagnosis not present

## 2019-11-20 DIAGNOSIS — Z7289 Other problems related to lifestyle: Secondary | ICD-10-CM | POA: Diagnosis not present

## 2019-11-20 DIAGNOSIS — D696 Thrombocytopenia, unspecified: Secondary | ICD-10-CM | POA: Diagnosis not present

## 2019-11-20 MED ORDER — SODIUM CHLORIDE 0.9 % IV SOLN
200.0000 mg | Freq: Once | INTRAVENOUS | Status: AC
  Administered 2019-11-20: 200 mg via INTRAVENOUS
  Filled 2019-11-20: qty 200

## 2019-11-20 MED ORDER — SODIUM CHLORIDE 0.9 % IV SOLN
Freq: Once | INTRAVENOUS | Status: AC
  Filled 2019-11-20: qty 250

## 2019-11-20 NOTE — Patient Instructions (Signed)

## 2019-11-23 DIAGNOSIS — H26491 Other secondary cataract, right eye: Secondary | ICD-10-CM | POA: Diagnosis not present

## 2019-11-27 ENCOUNTER — Telehealth: Payer: Self-pay

## 2019-11-27 NOTE — Telephone Encounter (Signed)
Yes, I would be happy to take over her medications please Dr. Laurance Flatten know.  Let her know.

## 2019-11-27 NOTE — Telephone Encounter (Signed)
Dr Freddi Che called and left a message asking if Dr Madilyn Fireman can take over medication management for Dawn Bailey. Please advise.    [phone]

## 2019-11-28 NOTE — Telephone Encounter (Signed)
Task completed. Contacted Dr. Tawanna Sat office, aware provider will take over patient's current medication.

## 2019-12-07 ENCOUNTER — Other Ambulatory Visit: Payer: Self-pay

## 2019-12-07 ENCOUNTER — Inpatient Hospital Stay: Payer: Medicare Other | Attending: Family | Admitting: Family

## 2019-12-07 ENCOUNTER — Inpatient Hospital Stay: Payer: Medicare Other

## 2019-12-07 ENCOUNTER — Encounter: Payer: Self-pay | Admitting: Family

## 2019-12-07 VITALS — BP 122/45 | HR 75 | Temp 98.2°F | Resp 18 | Ht 66.0 in | Wt 185.8 lb

## 2019-12-07 DIAGNOSIS — D500 Iron deficiency anemia secondary to blood loss (chronic): Secondary | ICD-10-CM | POA: Diagnosis not present

## 2019-12-07 DIAGNOSIS — Z79899 Other long term (current) drug therapy: Secondary | ICD-10-CM | POA: Diagnosis not present

## 2019-12-07 DIAGNOSIS — D72819 Decreased white blood cell count, unspecified: Secondary | ICD-10-CM

## 2019-12-07 DIAGNOSIS — D696 Thrombocytopenia, unspecified: Secondary | ICD-10-CM

## 2019-12-07 DIAGNOSIS — K284 Chronic or unspecified gastrojejunal ulcer with hemorrhage: Secondary | ICD-10-CM | POA: Insufficient documentation

## 2019-12-07 DIAGNOSIS — K5792 Diverticulitis of intestine, part unspecified, without perforation or abscess without bleeding: Secondary | ICD-10-CM | POA: Diagnosis not present

## 2019-12-07 LAB — CBC WITH DIFFERENTIAL (CANCER CENTER ONLY)
Abs Immature Granulocytes: 0.04 10*3/uL (ref 0.00–0.07)
Basophils Absolute: 0 10*3/uL (ref 0.0–0.1)
Basophils Relative: 0 %
Eosinophils Absolute: 0 10*3/uL (ref 0.0–0.5)
Eosinophils Relative: 0 %
HCT: 32 % — ABNORMAL LOW (ref 36.0–46.0)
Hemoglobin: 10.3 g/dL — ABNORMAL LOW (ref 12.0–15.0)
Immature Granulocytes: 1 %
Lymphocytes Relative: 43 %
Lymphs Abs: 1.3 10*3/uL (ref 0.7–4.0)
MCH: 24.6 pg — ABNORMAL LOW (ref 26.0–34.0)
MCHC: 32.2 g/dL (ref 30.0–36.0)
MCV: 76.6 fL — ABNORMAL LOW (ref 80.0–100.0)
Monocytes Absolute: 0.4 10*3/uL (ref 0.1–1.0)
Monocytes Relative: 12 %
Neutro Abs: 1.4 10*3/uL — ABNORMAL LOW (ref 1.7–7.7)
Neutrophils Relative %: 44 %
Platelet Count: 72 10*3/uL — ABNORMAL LOW (ref 150–400)
RBC: 4.18 MIL/uL (ref 3.87–5.11)
RDW: 16.5 % — ABNORMAL HIGH (ref 11.5–15.5)
WBC Count: 3.1 10*3/uL — ABNORMAL LOW (ref 4.0–10.5)
nRBC: 0 % (ref 0.0–0.2)

## 2019-12-07 LAB — CMP (CANCER CENTER ONLY)
ALT: 13 U/L (ref 0–44)
AST: 19 U/L (ref 15–41)
Albumin: 4.5 g/dL (ref 3.5–5.0)
Alkaline Phosphatase: 60 U/L (ref 38–126)
Anion gap: 5 (ref 5–15)
BUN: 10 mg/dL (ref 8–23)
CO2: 31 mmol/L (ref 22–32)
Calcium: 9.6 mg/dL (ref 8.9–10.3)
Chloride: 100 mmol/L (ref 98–111)
Creatinine: 0.73 mg/dL (ref 0.44–1.00)
GFR, Est AFR Am: >60 mL/min (ref >60)
GFR, Estimated: >60 mL/min (ref >60)
Glucose, Bld: 95 mg/dL (ref 70–99)
Potassium: 4.4 mmol/L (ref 3.5–5.1)
Sodium: 136 mmol/L (ref 135–145)
Total Bilirubin: 0.4 mg/dL (ref 0.3–1.2)
Total Protein: 7.1 g/dL (ref 6.5–8.1)

## 2019-12-07 LAB — IRON AND TIBC
Iron: 72 ug/dL (ref 41–142)
Saturation Ratios: 22 % (ref 21–57)
TIBC: 324 ug/dL (ref 236–444)
UIBC: 252 ug/dL (ref 120–384)

## 2019-12-07 LAB — SAVE SMEAR(SSMR), FOR PROVIDER SLIDE REVIEW

## 2019-12-07 LAB — RETICULOCYTES
Immature Retic Fract: 3.1 % (ref 2.3–15.9)
RBC.: 4.21 MIL/uL (ref 3.87–5.11)
Retic Count, Absolute: 32.4 10*3/uL (ref 19.0–186.0)
Retic Ct Pct: 0.8 % (ref 0.4–3.1)

## 2019-12-07 LAB — PLATELET BY CITRATE

## 2019-12-07 LAB — FERRITIN: Ferritin: 283 ng/mL (ref 11–307)

## 2019-12-07 NOTE — Progress Notes (Signed)
Hematology and Oncology Follow Up Visit  Dawn Bailey [id number] [birth year] 85 y.o. 12/07/2019   Principle Diagnosis:  Iron deficiency anemia  Possible mild myelodysplasia with leukopenia and thrombocytopenia   Current Therapy:   IV iron as indicated  Observation   Interim History:  Dawn Bailey is here today for follow-up. She did well with IV iron and states that her stamina is better.  Hgb is stable at 10.3, MCV 76, platelet count is 72 and WBC count 3.1.  She has not noted any blood loss. No bruising or petechiae.  She has had no issue with frequent infections. No fever, chills, n/v, cough, rash, dizziness, SOB, chest pain, palpitations, abdominal pain or changes in bowel or bladder habits.  No swelling, tenderness, numbness or tingling in her extremities at this time.  She has intermittent puffiness in her ankles at times. Pedal pulses are 2+.  No falls or syncopal episodes to report. She ambulates with a cane for added support.  She has maintained a good appetite and is staying well hydrated. Her weight is stable.   ECOG Performance Status: 1 - Symptomatic but completely ambulatory  Medications:  Allergies as of 12/07/2019      Reactions   Aripiprazole Other (See Comments)   REACTION: nightmares.   Nsaids Other (See Comments)   Bleeding ulcer   Skelaxin [metaxalone] Other (See Comments)   Cough, SOB   Tolmetin Other (See Comments)   Bleeding ulcer   Cephalexin Nausea And Vomiting   Lipitor [atorvastatin] Other (See Comments)   Muscle pain   Simvastatin Other (See Comments)   Muscle aches/   Sulfa Antibiotics Nausea Only   Sulfonamide Derivatives Nausea Only   Zanaflex [tizanidine Hcl] Other (See Comments)   dizziness      Medication List       Accurate as of December 07, 2019 10:46 AM. If you have any questions, ask your nurse or doctor.        carisoprodol 350 MG tablet Commonly known as: SOMA Take 1 tablet (350 mg total) by mouth daily as needed. for muscle  spams   clonazePAM 0.5 MG tablet Commonly known as: KLONOPIN   CoQ10 100 MG Caps Take 300 mg by mouth daily.   enalapril 5 MG tablet Commonly known as: VASOTEC TAKE 1 TABLET BY MOUTH  DAILY   hydrOXYzine 10 MG tablet Commonly known as: ATARAX/VISTARIL take 1 tablet by mouth three times a day if needed   loperamide 2 MG tablet Commonly known as: IMODIUM A-D Take 2 mg by mouth as needed.   melatonin 5 MG Tabs Take 5 mg by mouth at bedtime.   metoprolol tartrate 25 MG tablet Commonly known as: LOPRESSOR Take 1 tablet (25 mg total) by mouth 2 (two) times daily.   OMEGA 3 PO Take 1 capsule by mouth daily.   pantoprazole 40 MG tablet Commonly known as: PROTONIX Take 1 tablet (40 mg total) by mouth daily.   pravastatin 20 MG tablet Commonly known as: PRAVACHOL Take 1 tablet (20 mg total) by mouth every 3 (three) days. At bedtime   Probiotic 1-250 BILLION-MG Caps Take 1 capsule by mouth daily.   promethazine 25 MG tablet Commonly known as: PHENERGAN TAKE ONE TABLET BY MOUTH EVERY SIX HOURS AS NEEDED FOR NAUSEA   QC Tumeric Complex 500 MG Caps Generic drug: Turmeric Take 1 capsule by mouth daily.   sertraline 100 MG tablet Commonly known as: ZOLOFT Take 100 mg by mouth daily.   sucralfate 1 g  tablet Commonly known as: Carafate Take 1 tablet (1 g total) by mouth with breakfast, with lunch, and with evening meal. For 1 week   traZODone 100 MG tablet Commonly known as: DESYREL Take 100 mg by mouth at bedtime.   VITAMIN D3 PO Take 1 tablet by mouth daily.       Allergies:  Allergies  Allergen Reactions  . Aripiprazole Other (See Comments)    REACTION: nightmares.  . Nsaids Other (See Comments)    Bleeding ulcer  . Skelaxin [Metaxalone] Other (See Comments)    Cough, SOB  . Tolmetin Other (See Comments)    Bleeding ulcer  . Cephalexin Nausea And Vomiting  . Lipitor [Atorvastatin] Other (See Comments)    Muscle pain  . Simvastatin Other (See Comments)     Muscle aches/   . Sulfa Antibiotics Nausea Only  . Sulfonamide Derivatives Nausea Only  . Zanaflex [Tizanidine Hcl] Other (See Comments)    dizziness    Past Medical History, Surgical history, Social history, and Family History were reviewed and updated.  Review of Systems: All other 10 point review of systems is negative.   Physical Exam:  vitals were not taken for this visit.   Wt Readings from Last 3 Encounters:  11/02/19 185 lb (83.9 kg)  10/25/19 185 lb (83.9 kg)  09/28/19 184 lb (83.5 kg)    Ocular: Sclerae unicteric, pupils equal, round and reactive to light Ear-nose-throat: Oropharynx clear, dentition fair Lymphatic: No cervical or supraclavicular adenopathy Lungs no rales or rhonchi, good excursion bilaterally Heart regular rate and rhythm, no murmur appreciated Abd soft, nontender, positive bowel sounds, no liver or spleen tip palpated on exam, no fluid wave  MSK no focal spinal tenderness, no joint edema Neuro: non-focal, well-oriented, appropriate affect Breasts: Deferred   Lab Results  Component Value Date   WBC 3.1 (L) 12/07/2019   HGB 10.3 (L) 12/07/2019   HCT 32.0 (L) 12/07/2019   MCV 76.6 (L) 12/07/2019   PLT 72 (L) 12/07/2019   Lab Results  Component Value Date   FERRITIN 51 11/02/2019   IRON 49 11/02/2019   TIBC 349 11/02/2019   UIBC 300 11/02/2019   IRONPCTSAT 14 (L) 11/02/2019   Lab Results  Component Value Date   RETICCTPCT 0.8 12/07/2019   RBC 4.21 12/07/2019   No results found for: KPAFRELGTCHN, LAMBDASER, KAPLAMBRATIO No results found for: IGGSERUM, IGA, IGMSERUM No results found for: Ronnald Ramp, A1GS, Gilford Silvius, MSPIKE, SPEI   Chemistry      Component Value Date/Time   NA 134 (L) 11/02/2019 1034   K 3.6 11/02/2019 1034   CL 99 11/02/2019 1034   CO2 29 11/02/2019 1034   BUN 8 11/02/2019 1034   CREATININE 0.70 11/02/2019 1034   CREATININE 0.76 09/28/2019 0918   GLU 83 03/16/2011 0000        Component Value Date/Time   CALCIUM 9.2 11/02/2019 1034   ALKPHOS 63 11/02/2019 1034   AST 17 11/02/2019 1034   ALT 10 11/02/2019 1034   BILITOT 0.4 11/02/2019 1034       Impression and Plan: Dawn Bailey is a very pleasant 85 yo caucasian female with a long history of intermittent anemia secondary to GI ulcer and diverticulitis flares.  She also is felt to have an element of mild myelodysplasia with leukopenia and thrombocytopenia.  Her counts remain stable at this time and she is asymptomatic with the low platelets.  We will see what her iron studies look like and  replace if needed.  I spoke with Dr. Marin Olp about possibly adding an ESA and we will hold off at this time.  We will plan to see her again in another 6 weeks.  She can contact our office with any questions or concerns.   Laverna Peace, NP 8/5/202110:46 AM

## 2019-12-12 ENCOUNTER — Other Ambulatory Visit: Payer: Self-pay | Admitting: Family Medicine

## 2019-12-14 DIAGNOSIS — H26493 Other secondary cataract, bilateral: Secondary | ICD-10-CM | POA: Diagnosis not present

## 2019-12-14 DIAGNOSIS — H40053 Ocular hypertension, bilateral: Secondary | ICD-10-CM | POA: Diagnosis not present

## 2019-12-22 ENCOUNTER — Other Ambulatory Visit: Payer: Self-pay | Admitting: *Deleted

## 2019-12-22 ENCOUNTER — Encounter: Payer: Self-pay | Admitting: Family Medicine

## 2019-12-22 MED ORDER — NYSTATIN 100000 UNIT/GM EX CREA: 1.0000 "application " | TOPICAL_CREAM | Freq: Two times a day (BID) | CUTANEOUS | 1 refills | Status: DC

## 2019-12-26 ENCOUNTER — Ambulatory Visit: Payer: Medicare Other | Admitting: Gastroenterology

## 2019-12-28 ENCOUNTER — Ambulatory Visit (INDEPENDENT_AMBULATORY_CARE_PROVIDER_SITE_OTHER): Payer: Medicare Other | Admitting: Family Medicine

## 2019-12-28 ENCOUNTER — Encounter: Payer: Self-pay | Admitting: Family Medicine

## 2019-12-28 VITALS — BP 131/62 | HR 83 | Ht 66.0 in | Wt 186.0 lb

## 2019-12-28 DIAGNOSIS — F4310 Post-traumatic stress disorder, unspecified: Secondary | ICD-10-CM

## 2019-12-28 DIAGNOSIS — K921 Melena: Secondary | ICD-10-CM

## 2019-12-28 DIAGNOSIS — R1013 Epigastric pain: Secondary | ICD-10-CM | POA: Diagnosis not present

## 2019-12-28 DIAGNOSIS — D696 Thrombocytopenia, unspecified: Secondary | ICD-10-CM | POA: Diagnosis not present

## 2019-12-28 DIAGNOSIS — F331 Major depressive disorder, recurrent, moderate: Secondary | ICD-10-CM

## 2019-12-28 DIAGNOSIS — D72819 Decreased white blood cell count, unspecified: Secondary | ICD-10-CM | POA: Diagnosis not present

## 2019-12-28 DIAGNOSIS — F5101 Primary insomnia: Secondary | ICD-10-CM

## 2019-12-28 DIAGNOSIS — D500 Iron deficiency anemia secondary to blood loss (chronic): Secondary | ICD-10-CM

## 2019-12-28 DIAGNOSIS — E611 Iron deficiency: Secondary | ICD-10-CM | POA: Diagnosis not present

## 2019-12-28 MED ORDER — PANTOPRAZOLE SODIUM 40 MG PO TBEC: 40.0000 mg | DELAYED_RELEASE_TABLET | Freq: Every day | ORAL | 0 refills | Status: DC

## 2019-12-28 NOTE — Progress Notes (Signed)
Established Patient Office Visit  Subjective:  Patient ID: Dawn Bailey, female    DOB: Jan 11, 1935  Age: 85 y.o. MRN: [id number]  CC:  Chief Complaint  Patient presents with  . mood    HPI Dawn Bailey presents for f/u Mood.  I last saw her in May she had asked me to take over her psychiatric medications that Dr. Laurance Flatten had previously been prescribing she had been on the same regimen for quite some time.  He wanted to try to taper down on the sertraline so we decided to go down to 1-1/2 tabs daily for a total of 150 mg she is also on trazodone 150 mg.  In fact she says about 3 weeks ago she went ahead and went down to 100 mg daily and says so far she is actually doing really well.   Hypertension- Pt denies chest pain, SOB, dizziness, or heart palpitations.  Taking meds as directed w/o problems.  Denies medication side effects.    Follow-up insomnia-currently on trazodone 150 mg she is happy with her regimen.  At some point she says she would like to consider adjusting the medication but for now wants to keep it at the same dose especially since we are adjusting her sertraline.  Hasn't seen GI yet bc of car trouble.  She would actually like to have a GI closer in [place] instead of driving to [place] she is just worried about driving that far with her current car problems.  She is having a difficult time getting in with a Dealer evidently they lost several employees recently.  She wants to know she should continue her Protonix until she sees them she feels like her epigastric pain has improved since being on the Protonix and she has not seen any more blood in her stool please see prior note from Dr. Luetta Nutting in regards to patient presenting with melena.  She did have some questions regarding some of the diagnosis on her chart from the oncologists today   Past Medical History:  Diagnosis Date  . Allergy   . Anemia   . Back sprain/strain, thoracic   . Depression   .  Diverticulosis   . Gastric ulcer   . Hearing loss   . Hemorrhoids   . Hyperlipidemia   . Hypertension   . IBS (irritable bowel syndrome)   . Iron deficiency anemia due to chronic blood loss 11/03/2019  . Lumbar sprain and strain   . Migraines   . Nevus    atypical  . Obesity   . Osteopenia   . Panic attacks     Past Surgical History:  Procedure Laterality Date  . cataract surgery  2004   bilateral    Family History  Problem Relation Age of Onset  . Depression Mother   . Diabetes Father   . Hypertension Father   . Stroke Father   . Hypertension Brother     Social History   Socioeconomic History  . Marital status: Widowed    Spouse name: Not on file  . Number of children: 2  . Years of education: 53  . Highest education level: Some college, no degree  Occupational History  . Occupation: Press photographer    Comment: retired  Tobacco Use  . Smoking status: Former Smoker    Quit date: 05/05/1995    Years since quitting: 24.6  . Smokeless tobacco: Never Used  Vaping Use  . Vaping Use: Never used  Substance and Sexual Activity  .  Alcohol use: Yes    Alcohol/week: 0.5 standard drinks    Types: 1 Standard drinks or equivalent per week    Comment: occasionally  . Drug use: No    Comment: Caffiene: 1 cup of tea  . Sexual activity: Not Currently    Comment: married, 2 children, LMP over 10 yrs ago, walks everyday, daily caffeine.  Other Topics Concern  . Not on file  Social History Narrative   Lives alone   Drinks tea with caffeine in the am   Social Determinants of Health   Financial Resource Strain:   . Difficulty of Paying Living Expenses: Not on file  Food Insecurity:   . Worried About Charity fundraiser in the Last Year: Not on file  . Ran Out of Food in the Last Year: Not on file  Transportation Needs:   . Lack of Transportation (Medical): Not on file  . Lack of Transportation (Non-Medical): Not on file  Physical Activity:   . Days of Exercise per Week: Not on  file  . Minutes of Exercise per Session: Not on file  Stress:   . Feeling of Stress : Not on file  Social Connections:   . Frequency of Communication with Friends and Family: Not on file  . Frequency of Social Gatherings with Friends and Family: Not on file  . Attends Religious Services: Not on file  . Active Member of Clubs or Organizations: Not on file  . Attends Archivist Meetings: Not on file  . Marital Status: Not on file  Intimate Partner Violence:   . Fear of Current or Ex-Partner: Not on file  . Emotionally Abused: Not on file  . Physically Abused: Not on file  . Sexually Abused: Not on file    Outpatient Medications Prior to Visit  Medication Sig Dispense Refill  . Bacillus Coagulans-Inulin (PROBIOTIC) 1-250 BILLION-MG CAPS Take 1 capsule by mouth daily.    . carisoprodol (SOMA) 350 MG tablet TAKE ONE TABLET BY MOUTH ONE TIME DAILY AS NEEDED FOR MUSCLE SPASMS 90 tablet 0  . Cholecalciferol (VITAMIN D3 PO) Take 1 tablet by mouth daily.    . clonazePAM (KLONOPIN) 0.5 MG tablet     . Coenzyme Q10 (COQ10) 100 MG CAPS Take 300 mg by mouth daily.    . enalapril (VASOTEC) 5 MG tablet TAKE 1 TABLET BY MOUTH  DAILY 90 tablet 3  . hydrOXYzine (ATARAX/VISTARIL) 10 MG tablet take 1 tablet by mouth three times a day if needed 30 tablet 3  . loperamide (IMODIUM A-D) 2 MG tablet Take 2 mg by mouth as needed.      . melatonin 5 MG TABS Take 5 mg by mouth at bedtime.    . metoprolol tartrate (LOPRESSOR) 25 MG tablet Take 1 tablet (25 mg total) by mouth 2 (two) times daily. 180 tablet 1  . nystatin cream (MYCOSTATIN) Apply 1 application topically 2 (two) times daily. 60 g 1  . Omega-3 Fatty Acids (OMEGA 3 PO) Take 1 capsule by mouth daily.    . pravastatin (PRAVACHOL) 20 MG tablet Take 1 tablet (20 mg total) by mouth every 3 (three) days. At bedtime 90 tablet 3  . promethazine (PHENERGAN) 25 MG tablet TAKE ONE TABLET BY MOUTH EVERY SIX HOURS AS NEEDED FOR NAUSEA 30 tablet 3  .  traZODone (DESYREL) 100 MG tablet Take 100 mg by mouth at bedtime.  0  . Turmeric (QC TUMERIC COMPLEX) 500 MG CAPS Take 1 capsule by mouth daily.    Marland Kitchen  pantoprazole (PROTONIX) 40 MG tablet Take 1 tablet (40 mg total) by mouth daily. 30 tablet 1  . sertraline (ZOLOFT) 100 MG tablet Take 100 mg by mouth daily.     . sucralfate (CARAFATE) 1 g tablet Take 1 tablet (1 g total) by mouth with breakfast, with lunch, and with evening meal. For 1 week 21 tablet 0   No facility-administered medications prior to visit.    Allergies  Allergen Reactions  . Aripiprazole Other (See Comments)    REACTION: nightmares.  . Nsaids Other (See Comments)    Bleeding ulcer  . Skelaxin [Metaxalone] Other (See Comments)    Cough, SOB  . Tolmetin Other (See Comments)    Bleeding ulcer  . Cephalexin Nausea And Vomiting  . Lipitor [Atorvastatin] Other (See Comments)    Muscle pain  . Simvastatin Other (See Comments)    Muscle aches/   . Sulfa Antibiotics Nausea Only  . Sulfonamide Derivatives Nausea Only  . Zanaflex [Tizanidine Hcl] Other (See Comments)    dizziness    ROS Review of Systems    Objective:    Physical Exam Constitutional:      Appearance: She is well-developed.  HENT:     Head: Normocephalic and atraumatic.  Cardiovascular:     Rate and Rhythm: Normal rate and regular rhythm.     Heart sounds: Normal heart sounds.  Pulmonary:     Effort: Pulmonary effort is normal.     Breath sounds: Normal breath sounds.  Skin:    General: Skin is warm and dry.  Neurological:     Mental Status: She is alert and oriented to person, place, and time.  Psychiatric:        Behavior: Behavior normal.     BP 131/62   Pulse 83   Ht 5\' 6"  (1.676 m)   Wt 186 lb (84.4 kg)   SpO2 96%   BMI 30.02 kg/m  Wt Readings from Last 3 Encounters:  12/28/19 186 lb (84.4 kg)  12/07/19 185 lb 12.8 oz (84.3 kg)  11/02/19 185 lb (83.9 kg)     Health Maintenance Due  Topic Date Due  . INFLUENZA VACCINE   12/03/2019    There are no preventive care reminders to display for this patient.  Lab Results  Component Value Date   TSH 2.58 05/20/2018   Lab Results  Component Value Date   WBC 3.1 (L) 12/07/2019   HGB 10.3 (L) 12/07/2019   HCT 32.0 (L) 12/07/2019   MCV 76.6 (L) 12/07/2019   PLT 72 (L) 12/07/2019   Lab Results  Component Value Date   NA 136 12/07/2019   K 4.4 12/07/2019   CO2 31 12/07/2019   GLUCOSE 95 12/07/2019   BUN 10 12/07/2019   CREATININE 0.73 12/07/2019   BILITOT 0.4 12/07/2019   ALKPHOS 60 12/07/2019   AST 19 12/07/2019   ALT 13 12/07/2019   PROT 7.1 12/07/2019   ALBUMIN 4.5 12/07/2019   CALCIUM 9.6 12/07/2019   ANIONGAP 5 12/07/2019   Lab Results  Component Value Date   CHOL 261 (H) 09/28/2019   Lab Results  Component Value Date   HDL 72 09/28/2019   Lab Results  Component Value Date   LDLCALC 163 (H) 09/28/2019   Lab Results  Component Value Date   TRIG 136 09/28/2019   Lab Results  Component Value Date   CHOLHDL 3.6 09/28/2019   Lab Results  Component Value Date   HGBA1C 5.1 09/28/2019  Assessment & Plan:   Problem List Items Addressed This Visit      Other   Posttraumatic stress disorder    Continuing to do well with taper of sertraline.  Then I want to go very slowly so in case there starts to be recurrence of symptoms then we can always go back up on her dose if needed.      Relevant Medications   sertraline (ZOLOFT) 100 MG tablet   Major depressive disorder, recurrent episode, moderate (HCC)    So far she is actually done really well with her taper on her sertraline from 200 mg down to 100 mg thus far she has been on the 100 mg for approximately 3 weeks.  I had like for her to stay on that for about an additional 3 weeks if over that total time of 6 weeks she has done well then we will decrease her down to 50 mg for 6 weeks and then continue to taper from there.  Continue to use the clonazepam very sparingly.       Relevant Medications   sertraline (ZOLOFT) 100 MG tablet   Iron deficiency anemia due to chronic blood loss (Chronic)   Insomnia    Now she does not want a make any adjustments to the trazodone so we will continue with 150 mg she feels like it does help with her sleep she still wakes up in the middle the night has difficulty going back to sleep but is happy with her regimen for the moment.      Epigastric pain    Improved with Protonix recommend continue medication for now until she is able to follow-up with GI.  We will go ahead and place a new GI referral for here locally in [place].  No longer needing the Carafate so removed from her medication list.      Relevant Orders   Ambulatory referral to Gastroenterology    Other Visit Diagnoses    Melena    -  Primary   Relevant Orders   Ambulatory referral to Gastroenterology   Leukopenia, unspecified type       Thrombocytopenia ([place])         Melena seems to have resolved for now which is very reassuring.  Discussed diagnosis of thrombocytopenia and leukopenia she is currently being evaluated by hematology oncology.  Not quite sure yet why multiple set cell lines are low but she is still undergoing work-up.  Meds ordered this encounter  Medications  . pantoprazole (PROTONIX) 40 MG tablet    Sig: Take 1 tablet (40 mg total) by mouth daily.    Dispense:  90 tablet    Refill:  0  . sertraline (ZOLOFT) 100 MG tablet    Sig: Take 1 tablet (100 mg total) by mouth daily. Pt will call when needed.    Dispense:  30 tablet    Refill:  0    Follow-up: Return in about 9 weeks (around 02/29/2020) for Mood medication. [name], MD

## 2019-12-28 NOTE — Patient Instructions (Addendum)
Continue sertraline 100 mg daily for about 3 more weeks after that we will drop you down to the 50 mg I can always send in a new prescription so that she do not have to split pills.  Continue that dose for 6 weeks and then I will see you back.  To new current dose of trazodone.

## 2019-12-29 ENCOUNTER — Encounter: Payer: Self-pay | Admitting: Family Medicine

## 2019-12-29 MED ORDER — SERTRALINE HCL 100 MG PO TABS: 100.0000 mg | ORAL_TABLET | Freq: Every day | ORAL | 0 refills | Status: DC

## 2019-12-29 NOTE — Assessment & Plan Note (Signed)
Continuing to do well with taper of sertraline.  Then I want to go very slowly so in case there starts to be recurrence of symptoms then we can always go back up on her dose if needed.

## 2019-12-29 NOTE — Assessment & Plan Note (Addendum)
Improved with Protonix recommend continue medication for now until she is able to follow-up with GI.  We will go ahead and place a new GI referral for here locally in [place].  No longer needing the Carafate so removed from her medication list.

## 2019-12-29 NOTE — Assessment & Plan Note (Signed)
Now she does not want a make any adjustments to the trazodone so we will continue with 150 mg she feels like it does help with her sleep she still wakes up in the middle the night has difficulty going back to sleep but is happy with her regimen for the moment.

## 2019-12-29 NOTE — Assessment & Plan Note (Signed)
So far she is actually done really well with her taper on her sertraline from 200 mg down to 100 mg thus far she has been on the 100 mg for approximately 3 weeks.  I had like for her to stay on that for about an additional 3 weeks if over that total time of 6 weeks she has done well then we will decrease her down to 50 mg for 6 weeks and then continue to taper from there.  Continue to use the clonazepam very sparingly.

## 2020-01-03 ENCOUNTER — Encounter: Payer: Self-pay | Admitting: Family Medicine

## 2020-01-17 ENCOUNTER — Other Ambulatory Visit: Payer: Self-pay | Admitting: Family

## 2020-01-17 DIAGNOSIS — D696 Thrombocytopenia, unspecified: Secondary | ICD-10-CM

## 2020-01-17 DIAGNOSIS — D500 Iron deficiency anemia secondary to blood loss (chronic): Secondary | ICD-10-CM

## 2020-01-17 DIAGNOSIS — D72819 Decreased white blood cell count, unspecified: Secondary | ICD-10-CM

## 2020-01-18 ENCOUNTER — Other Ambulatory Visit: Payer: Self-pay

## 2020-01-18 ENCOUNTER — Encounter: Payer: Self-pay | Admitting: Family

## 2020-01-18 ENCOUNTER — Inpatient Hospital Stay: Payer: Medicare Other

## 2020-01-18 ENCOUNTER — Inpatient Hospital Stay: Payer: Medicare Other | Attending: Family | Admitting: Family

## 2020-01-18 VITALS — BP 148/69 | HR 85 | Temp 98.6°F | Resp 18 | Ht 66.0 in | Wt 185.0 lb

## 2020-01-18 DIAGNOSIS — D696 Thrombocytopenia, unspecified: Secondary | ICD-10-CM | POA: Insufficient documentation

## 2020-01-18 DIAGNOSIS — D469 Myelodysplastic syndrome, unspecified: Secondary | ICD-10-CM | POA: Insufficient documentation

## 2020-01-18 DIAGNOSIS — D500 Iron deficiency anemia secondary to blood loss (chronic): Secondary | ICD-10-CM | POA: Diagnosis not present

## 2020-01-18 DIAGNOSIS — D72819 Decreased white blood cell count, unspecified: Secondary | ICD-10-CM

## 2020-01-18 DIAGNOSIS — K289 Gastrojejunal ulcer, unspecified as acute or chronic, without hemorrhage or perforation: Secondary | ICD-10-CM | POA: Insufficient documentation

## 2020-01-18 DIAGNOSIS — Z79899 Other long term (current) drug therapy: Secondary | ICD-10-CM | POA: Insufficient documentation

## 2020-01-18 DIAGNOSIS — K5792 Diverticulitis of intestine, part unspecified, without perforation or abscess without bleeding: Secondary | ICD-10-CM | POA: Diagnosis not present

## 2020-01-18 LAB — CBC WITH DIFFERENTIAL (CANCER CENTER ONLY)
Abs Immature Granulocytes: 0.07 10*3/uL (ref 0.00–0.07)
Basophils Absolute: 0 10*3/uL (ref 0.0–0.1)
Basophils Relative: 0 %
Eosinophils Absolute: 0 10*3/uL (ref 0.0–0.5)
Eosinophils Relative: 0 %
HCT: 33.4 % — ABNORMAL LOW (ref 36.0–46.0)
Hemoglobin: 10.6 g/dL — ABNORMAL LOW (ref 12.0–15.0)
Immature Granulocytes: 2 %
Lymphocytes Relative: 33 %
Lymphs Abs: 1.1 10*3/uL (ref 0.7–4.0)
MCH: 24.2 pg — ABNORMAL LOW (ref 26.0–34.0)
MCHC: 31.7 g/dL (ref 30.0–36.0)
MCV: 76.3 fL — ABNORMAL LOW (ref 80.0–100.0)
Monocytes Absolute: 0.3 10*3/uL (ref 0.1–1.0)
Monocytes Relative: 9 %
Neutro Abs: 1.9 10*3/uL (ref 1.7–7.7)
Neutrophils Relative %: 56 %
Platelet Count: 96 10*3/uL — ABNORMAL LOW (ref 150–400)
RBC: 4.38 MIL/uL (ref 3.87–5.11)
RDW: 16.3 % — ABNORMAL HIGH (ref 11.5–15.5)
WBC Count: 3.4 10*3/uL — ABNORMAL LOW (ref 4.0–10.5)
nRBC: 0 % (ref 0.0–0.2)

## 2020-01-18 LAB — CMP (CANCER CENTER ONLY)
ALT: 18 U/L (ref 0–44)
AST: 24 U/L (ref 15–41)
Albumin: 4.4 g/dL (ref 3.5–5.0)
Alkaline Phosphatase: 68 U/L (ref 38–126)
Anion gap: 8 (ref 5–15)
BUN: 8 mg/dL (ref 8–23)
CO2: 30 mmol/L (ref 22–32)
Calcium: 9.6 mg/dL (ref 8.9–10.3)
Chloride: 99 mmol/L (ref 98–111)
Creatinine: 0.75 mg/dL (ref 0.44–1.00)
GFR, Est AFR Am: >60 mL/min (ref >60)
GFR, Estimated: >60 mL/min (ref >60)
Glucose, Bld: 91 mg/dL (ref 70–99)
Potassium: 4.2 mmol/L (ref 3.5–5.1)
Sodium: 137 mmol/L (ref 135–145)
Total Bilirubin: 0.4 mg/dL (ref 0.3–1.2)
Total Protein: 6.8 g/dL (ref 6.5–8.1)

## 2020-01-18 LAB — RETICULOCYTES
Immature Retic Fract: 6.8 % (ref 2.3–15.9)
RBC.: 4.47 MIL/uL (ref 3.87–5.11)
Retic Count, Absolute: 37.5 10*3/uL (ref 19.0–186.0)
Retic Ct Pct: 0.8 % (ref 0.4–3.1)

## 2020-01-18 LAB — PLATELET BY CITRATE

## 2020-01-18 NOTE — Progress Notes (Signed)
Hematology and Oncology Follow Up Visit  Dawn Bailey [id number] [birth year] 85 y.o. 01/18/2020   Principle Diagnosis:  Iron deficiency anemia  Possible mild myelodysplasia with leukopenia and thrombocytopenia   Current Therapy:        IV iron as indicated  Observation   Interim History:  Dawn Bailey is here today for follow-up. She is doing quite well and has no complaints at this time.  Her platelet count is now up to 96, Hgb 10.6 and WBC count 3.4.  She has been eating papaya regularly.  No fever, chills, n/v, cough, rash, dizziness, SOB, chest pain, palpitations, abdominal pain or changes in bowel or bladder habits.  No episodes of bleeding. No bruising or petechiae.  No tenderness, numbness or tingling in her extremities.  She has intermittent puffiness in her left ankle. This may possibly related to previous knee sugery for meniscus tear.  No falls or syncope.  She has maintained a good appetite and is staying well hydrated. Her weight is stable.   ECOG Performance Status: 1 - Symptomatic but completely ambulatory  Medications:  Allergies as of 01/18/2020      Reactions   Aripiprazole Other (See Comments)   REACTION: nightmares.   Nsaids Other (See Comments)   Bleeding ulcer   Skelaxin [metaxalone] Other (See Comments)   Cough, SOB   Tolmetin Other (See Comments)   Bleeding ulcer   Cephalexin Nausea And Vomiting   Lipitor [atorvastatin] Other (See Comments)   Muscle pain   Simvastatin Other (See Comments)   Muscle aches/   Sulfa Antibiotics Nausea Only   Sulfonamide Derivatives Nausea Only   Zanaflex [tizanidine Hcl] Other (See Comments)   dizziness      Medication List       Accurate as of January 18, 2020 11:01 AM. If you have any questions, ask your nurse or doctor.        carisoprodol 350 MG tablet Commonly known as: SOMA TAKE ONE TABLET BY MOUTH ONE TIME DAILY AS NEEDED FOR MUSCLE SPASMS   clonazePAM 0.5 MG tablet Commonly known as: KLONOPIN     CoQ10 100 MG Caps Take 300 mg by mouth daily.   enalapril 5 MG tablet Commonly known as: VASOTEC TAKE 1 TABLET BY MOUTH  DAILY   hydrOXYzine 10 MG tablet Commonly known as: ATARAX/VISTARIL take 1 tablet by mouth three times a day if needed   loperamide 2 MG tablet Commonly known as: IMODIUM A-D Take 2 mg by mouth as needed.   melatonin 5 MG Tabs Take 5 mg by mouth at bedtime.   metoprolol tartrate 25 MG tablet Commonly known as: LOPRESSOR Take 1 tablet (25 mg total) by mouth 2 (two) times daily.   nystatin cream Commonly known as: MYCOSTATIN Apply 1 application topically 2 (two) times daily.   OMEGA 3 PO Take 1 capsule by mouth daily.   pantoprazole 40 MG tablet Commonly known as: PROTONIX Take 1 tablet (40 mg total) by mouth daily.   pravastatin 20 MG tablet Commonly known as: PRAVACHOL Take 1 tablet (20 mg total) by mouth every 3 (three) days. At bedtime   Probiotic 1-250 BILLION-MG Caps Take 1 capsule by mouth daily.   promethazine 25 MG tablet Commonly known as: PHENERGAN TAKE ONE TABLET BY MOUTH EVERY SIX HOURS AS NEEDED FOR NAUSEA   QC Tumeric Complex 500 MG Caps Generic drug: Turmeric Take 1 capsule by mouth daily.   sertraline 100 MG tablet Commonly known as: ZOLOFT Take 1 tablet (100 mg  total) by mouth daily. Pt will call when needed.   traZODone 100 MG tablet Commonly known as: DESYREL Take 100 mg by mouth at bedtime.   VITAMIN D3 PO Take 1 tablet by mouth daily.       Allergies:  Allergies  Allergen Reactions   Aripiprazole Other (See Comments)    REACTION: nightmares.   Nsaids Other (See Comments)    Bleeding ulcer   Skelaxin [Metaxalone] Other (See Comments)    Cough, SOB   Tolmetin Other (See Comments)    Bleeding ulcer   Cephalexin Nausea And Vomiting   Lipitor [Atorvastatin] Other (See Comments)    Muscle pain   Simvastatin Other (See Comments)    Muscle aches/    Sulfa Antibiotics Nausea Only   Sulfonamide  Derivatives Nausea Only   Zanaflex [Tizanidine Hcl] Other (See Comments)    dizziness    Past Medical History, Surgical history, Social history, and Family History were reviewed and updated.  Review of Systems: All other 10 point review of systems is negative.   Physical Exam:  height is 5\' 6"  (1.676 m) and weight is 185 lb (83.9 kg). Her oral temperature is 98.6 F (37 C). Her blood pressure is 148/69 (abnormal) and her pulse is 85. Her respiration is 18.   Wt Readings from Last 3 Encounters:  01/18/20 185 lb (83.9 kg)  12/28/19 186 lb (84.4 kg)  12/07/19 185 lb 12.8 oz (84.3 kg)    Ocular: Sclerae unicteric, pupils equal, round and reactive to light Ear-nose-throat: Oropharynx clear, dentition fair Lymphatic: No cervical or supraclavicular adenopathy Lungs no rales or rhonchi, good excursion bilaterally Heart regular rate and rhythm, no murmur appreciated Abd soft, nontender, positive bowel sounds MSK no focal spinal tenderness, no joint edema Neuro: non-focal, well-oriented, appropriate affect Breasts: Deferred   Lab Results  Component Value Date   WBC 3.4 (L) 01/18/2020   HGB 10.6 (L) 01/18/2020   HCT 33.4 (L) 01/18/2020   MCV 76.3 (L) 01/18/2020   PLT 96 (L) 01/18/2020   Lab Results  Component Value Date   FERRITIN 283 12/07/2019   IRON 72 12/07/2019   TIBC 324 12/07/2019   UIBC 252 12/07/2019   IRONPCTSAT 22 12/07/2019   Lab Results  Component Value Date   RETICCTPCT 0.8 01/18/2020   RBC 4.47 01/18/2020   No results found for: KPAFRELGTCHN, LAMBDASER, KAPLAMBRATIO No results found for: IGGSERUM, IGA, IGMSERUM No results found for: Ronnald Ramp, A1GS, A2GS, Violet Baldy, MSPIKE, SPEI   Chemistry      Component Value Date/Time   NA 137 01/18/2020 1022   K 4.2 01/18/2020 1022   CL 99 01/18/2020 1022   CO2 30 01/18/2020 1022   BUN 8 01/18/2020 1022   CREATININE 0.75 01/18/2020 1022   CREATININE 0.76 09/28/2019 0918   GLU 83  03/16/2011 0000      Component Value Date/Time   CALCIUM 9.6 01/18/2020 1022   ALKPHOS 68 01/18/2020 1022   AST 24 01/18/2020 1022   ALT 18 01/18/2020 1022   BILITOT 0.4 01/18/2020 1022       Impression and Plan: Dawn Bailey is a very pleasant 85 yo caucasian female with along history of intermittent anemia secondary to GI ulcer and diverticulitis flares. She also is felt to have an element of mild myelodysplasia with leukopenia and thrombocytopenia.  Her numbers on CBC this week are stable to improved. She has not complaints at this time.  We will see what her iron studies look like  and replace if needed.  We will plan to see her again in 8 weeks.  She can contact our office with any questions or concerns.   Laverna Peace, NP 9/16/202111:01 AM

## 2020-01-19 ENCOUNTER — Telehealth: Payer: Self-pay | Admitting: Family

## 2020-01-19 LAB — IRON AND TIBC
Iron: 44 ug/dL (ref 41–142)
Saturation Ratios: 14 % — ABNORMAL LOW (ref 21–57)
TIBC: 324 ug/dL (ref 236–444)
UIBC: 280 ug/dL (ref 120–384)

## 2020-01-19 LAB — FERRITIN: Ferritin: 192 ng/mL (ref 11–307)

## 2020-01-19 NOTE — Telephone Encounter (Signed)
I was able to speak with the patient about her most recent lab work. She had a brief episode of dizziness after leaving our office yesterday. This resolved without intervention. She has not had this happen in quite a while and wanted to let us know.  She will be getting a total of 5 doses of IV iron with the first dose next week.  Her blood sugar at that time was 90.  She has no other questions or concerns at this time.  She can contact our office if needed and will go to the ED in the event of an emergency.

## 2020-01-25 ENCOUNTER — Other Ambulatory Visit: Payer: Self-pay

## 2020-01-25 ENCOUNTER — Inpatient Hospital Stay: Payer: Medicare Other

## 2020-01-25 VITALS — BP 124/54 | HR 70 | Temp 98.5°F | Resp 18

## 2020-01-25 DIAGNOSIS — Z79899 Other long term (current) drug therapy: Secondary | ICD-10-CM | POA: Diagnosis not present

## 2020-01-25 DIAGNOSIS — D500 Iron deficiency anemia secondary to blood loss (chronic): Secondary | ICD-10-CM | POA: Diagnosis not present

## 2020-01-25 DIAGNOSIS — D696 Thrombocytopenia, unspecified: Secondary | ICD-10-CM | POA: Diagnosis not present

## 2020-01-25 DIAGNOSIS — K289 Gastrojejunal ulcer, unspecified as acute or chronic, without hemorrhage or perforation: Secondary | ICD-10-CM | POA: Diagnosis not present

## 2020-01-25 DIAGNOSIS — K5792 Diverticulitis of intestine, part unspecified, without perforation or abscess without bleeding: Secondary | ICD-10-CM | POA: Diagnosis not present

## 2020-01-25 DIAGNOSIS — D469 Myelodysplastic syndrome, unspecified: Secondary | ICD-10-CM | POA: Diagnosis not present

## 2020-01-25 MED ORDER — SODIUM CHLORIDE 0.9 % IV SOLN
Freq: Once | INTRAVENOUS | Status: AC
  Filled 2020-01-25: qty 250

## 2020-01-25 MED ORDER — SODIUM CHLORIDE 0.9 % IV SOLN
200.0000 mg | Freq: Once | INTRAVENOUS | Status: AC
  Administered 2020-01-25: 200 mg via INTRAVENOUS
  Filled 2020-01-25: qty 200

## 2020-01-26 ENCOUNTER — Encounter: Payer: Self-pay | Admitting: Family Medicine

## 2020-01-29 ENCOUNTER — Inpatient Hospital Stay: Payer: Medicare Other

## 2020-01-29 ENCOUNTER — Other Ambulatory Visit: Payer: Self-pay

## 2020-01-29 VITALS — BP 131/64 | HR 70 | Temp 98.4°F | Resp 18

## 2020-01-29 DIAGNOSIS — D469 Myelodysplastic syndrome, unspecified: Secondary | ICD-10-CM | POA: Diagnosis not present

## 2020-01-29 DIAGNOSIS — D696 Thrombocytopenia, unspecified: Secondary | ICD-10-CM | POA: Diagnosis not present

## 2020-01-29 DIAGNOSIS — D500 Iron deficiency anemia secondary to blood loss (chronic): Secondary | ICD-10-CM

## 2020-01-29 DIAGNOSIS — K5792 Diverticulitis of intestine, part unspecified, without perforation or abscess without bleeding: Secondary | ICD-10-CM | POA: Diagnosis not present

## 2020-01-29 DIAGNOSIS — Z79899 Other long term (current) drug therapy: Secondary | ICD-10-CM | POA: Diagnosis not present

## 2020-01-29 DIAGNOSIS — K289 Gastrojejunal ulcer, unspecified as acute or chronic, without hemorrhage or perforation: Secondary | ICD-10-CM | POA: Diagnosis not present

## 2020-01-29 MED ORDER — SODIUM CHLORIDE 0.9 % IV SOLN
Freq: Once | INTRAVENOUS | Status: AC
  Filled 2020-01-29: qty 250

## 2020-01-29 MED ORDER — SODIUM CHLORIDE 0.9 % IV SOLN
200.0000 mg | Freq: Once | INTRAVENOUS | Status: AC
  Administered 2020-01-29: 200 mg via INTRAVENOUS
  Filled 2020-01-29: qty 200

## 2020-01-29 NOTE — Patient Instructions (Signed)

## 2020-01-31 ENCOUNTER — Other Ambulatory Visit: Payer: Self-pay | Admitting: Family Medicine

## 2020-02-01 ENCOUNTER — Other Ambulatory Visit: Payer: Self-pay

## 2020-02-01 ENCOUNTER — Inpatient Hospital Stay: Payer: Medicare Other

## 2020-02-01 VITALS — BP 133/72 | HR 70 | Temp 98.6°F | Resp 17

## 2020-02-01 DIAGNOSIS — Z79899 Other long term (current) drug therapy: Secondary | ICD-10-CM | POA: Diagnosis not present

## 2020-02-01 DIAGNOSIS — K289 Gastrojejunal ulcer, unspecified as acute or chronic, without hemorrhage or perforation: Secondary | ICD-10-CM | POA: Diagnosis not present

## 2020-02-01 DIAGNOSIS — D469 Myelodysplastic syndrome, unspecified: Secondary | ICD-10-CM | POA: Diagnosis not present

## 2020-02-01 DIAGNOSIS — D500 Iron deficiency anemia secondary to blood loss (chronic): Secondary | ICD-10-CM | POA: Diagnosis not present

## 2020-02-01 DIAGNOSIS — K5792 Diverticulitis of intestine, part unspecified, without perforation or abscess without bleeding: Secondary | ICD-10-CM | POA: Diagnosis not present

## 2020-02-01 DIAGNOSIS — D696 Thrombocytopenia, unspecified: Secondary | ICD-10-CM | POA: Diagnosis not present

## 2020-02-01 MED ORDER — SODIUM CHLORIDE 0.9 % IV SOLN
200.0000 mg | Freq: Once | INTRAVENOUS | Status: AC
  Administered 2020-02-01: 200 mg via INTRAVENOUS
  Filled 2020-02-01: qty 200

## 2020-02-01 MED ORDER — SODIUM CHLORIDE 0.9 % IV SOLN
Freq: Once | INTRAVENOUS | Status: AC
  Filled 2020-02-01: qty 250

## 2020-02-01 NOTE — Patient Instructions (Signed)

## 2020-02-05 ENCOUNTER — Inpatient Hospital Stay: Payer: Medicare Other | Attending: Family

## 2020-02-05 ENCOUNTER — Other Ambulatory Visit: Payer: Self-pay

## 2020-02-05 VITALS — BP 114/64 | HR 71 | Temp 98.3°F | Resp 16

## 2020-02-05 DIAGNOSIS — K259 Gastric ulcer, unspecified as acute or chronic, without hemorrhage or perforation: Secondary | ICD-10-CM | POA: Insufficient documentation

## 2020-02-05 DIAGNOSIS — K5792 Diverticulitis of intestine, part unspecified, without perforation or abscess without bleeding: Secondary | ICD-10-CM | POA: Diagnosis not present

## 2020-02-05 DIAGNOSIS — D500 Iron deficiency anemia secondary to blood loss (chronic): Secondary | ICD-10-CM

## 2020-02-05 DIAGNOSIS — D508 Other iron deficiency anemias: Secondary | ICD-10-CM | POA: Diagnosis not present

## 2020-02-05 MED ORDER — SODIUM CHLORIDE 0.9 % IV SOLN
200.0000 mg | Freq: Once | INTRAVENOUS | Status: AC
  Administered 2020-02-05: 200 mg via INTRAVENOUS
  Filled 2020-02-05: qty 10

## 2020-02-05 MED ORDER — SODIUM CHLORIDE 0.9 % IV SOLN
Freq: Once | INTRAVENOUS | Status: AC
  Filled 2020-02-05: qty 250

## 2020-02-05 NOTE — Patient Instructions (Signed)

## 2020-02-05 NOTE — Progress Notes (Signed)
Patient declined to stay for the post infusion observation period. Patient denies any difficulty with this infusion in the past and is aware to call with any questions or concerns.   Pt verbalized understanding and had no further questions today.   

## 2020-02-08 ENCOUNTER — Inpatient Hospital Stay: Payer: Medicare Other

## 2020-02-08 ENCOUNTER — Other Ambulatory Visit: Payer: Self-pay

## 2020-02-08 VITALS — BP 135/57 | HR 79 | Temp 98.7°F | Resp 17

## 2020-02-08 DIAGNOSIS — D500 Iron deficiency anemia secondary to blood loss (chronic): Secondary | ICD-10-CM

## 2020-02-08 DIAGNOSIS — K5792 Diverticulitis of intestine, part unspecified, without perforation or abscess without bleeding: Secondary | ICD-10-CM | POA: Diagnosis not present

## 2020-02-08 DIAGNOSIS — D508 Other iron deficiency anemias: Secondary | ICD-10-CM | POA: Diagnosis not present

## 2020-02-08 DIAGNOSIS — K259 Gastric ulcer, unspecified as acute or chronic, without hemorrhage or perforation: Secondary | ICD-10-CM | POA: Diagnosis not present

## 2020-02-08 MED ORDER — SODIUM CHLORIDE 0.9 % IV SOLN
200.0000 mg | Freq: Once | INTRAVENOUS | Status: AC
  Administered 2020-02-08: 200 mg via INTRAVENOUS
  Filled 2020-02-08: qty 200

## 2020-02-08 MED ORDER — SODIUM CHLORIDE 0.9 % IV SOLN
Freq: Once | INTRAVENOUS | Status: AC
  Filled 2020-02-08: qty 250

## 2020-02-08 NOTE — Patient Instructions (Signed)

## 2020-02-29 ENCOUNTER — Encounter: Payer: Self-pay | Admitting: Family Medicine

## 2020-02-29 ENCOUNTER — Ambulatory Visit (INDEPENDENT_AMBULATORY_CARE_PROVIDER_SITE_OTHER): Payer: Medicare Other | Admitting: Family Medicine

## 2020-02-29 ENCOUNTER — Other Ambulatory Visit: Payer: Self-pay | Admitting: Family Medicine

## 2020-02-29 VITALS — BP 124/57 | HR 82 | Ht 66.0 in | Wt 185.0 lb

## 2020-02-29 DIAGNOSIS — F331 Major depressive disorder, recurrent, moderate: Secondary | ICD-10-CM | POA: Diagnosis not present

## 2020-02-29 DIAGNOSIS — Z23 Encounter for immunization: Secondary | ICD-10-CM | POA: Diagnosis not present

## 2020-02-29 DIAGNOSIS — F5101 Primary insomnia: Secondary | ICD-10-CM | POA: Diagnosis not present

## 2020-02-29 DIAGNOSIS — I10 Essential (primary) hypertension: Secondary | ICD-10-CM

## 2020-02-29 DIAGNOSIS — E7800 Pure hypercholesterolemia, unspecified: Secondary | ICD-10-CM

## 2020-02-29 DIAGNOSIS — D500 Iron deficiency anemia secondary to blood loss (chronic): Secondary | ICD-10-CM

## 2020-02-29 MED ORDER — PRAVASTATIN SODIUM 20 MG PO TABS: 20.0000 mg | ORAL_TABLET | ORAL | 0 refills | Status: DC

## 2020-02-29 MED ORDER — CARISOPRODOL 350 MG PO TABS
ORAL_TABLET | ORAL | 0 refills | Status: DC
Start: 2020-02-29 — End: 2020-03-06

## 2020-02-29 MED ORDER — CLONAZEPAM 0.5 MG PO TABS
0.2500 mg | ORAL_TABLET | Freq: Every day | ORAL | 0 refills | Status: DC | PRN
Start: 2020-02-29 — End: 2020-10-18

## 2020-02-29 MED ORDER — HYDROXYZINE HCL 10 MG PO TABS: 10.0000 mg | ORAL_TABLET | Freq: Two times a day (BID) | ORAL | 1 refills | Status: DC | PRN

## 2020-02-29 NOTE — Assessment & Plan Note (Addendum)
Stable on current regimen.  She did have to go back to up to 150 mg on the sertraline which is fine.  She says she has plenty of medication and does not need a new prescription yet.  Follow-up in 3 to 4 months.  I did refill her alprazolam but just encouraged her to use sparingly.  Normally 60 tabs last her almost 3 months.  She did request that it be sent to mail order.

## 2020-02-29 NOTE — Assessment & Plan Note (Signed)
Well controlled. Continue current regimen. Follow up in  6 months.  

## 2020-02-29 NOTE — Assessment & Plan Note (Signed)
Continue statin. 

## 2020-02-29 NOTE — Progress Notes (Addendum)
Established Patient Office Visit  Subjective:  Patient ID: Dawn Bailey, female    DOB: 04-27-1935  Age: 85 y.o. MRN: [id number]  CC:  Chief Complaint  Patient presents with  . mood    HPI Dawn Bailey presents for   Follow-up of mood.  She gone down to sertraline had 100 mg and was on it for couple months and was doing well but says she did end up going back up to 150 mg was a still lower than her previous dose.  She said she just started feeling a little bit down and decided to go back up for now.  She is still taking 125 mg of the trazodone at bedtime.  It does work well to help her sleep.  Tries to use her clonazepam sparingly she is currently on 0.5 mg.  She needs an updated prescription.  Migraine headaches-she said she just got over having a 3-day migraine she says this happens about twice a year where it is that severe.  She is requesting a refill on her Manuela Neptune as a muscle relaxer for her neck and back which she feels often triggers her headaches.   She also previously had an episode of melena and when I saw her in August she had not seen GI yet because of some car troubles.  More recently she is struggling with her finances and so has not made the appointment yet but she has not noticed any more dark-colored or bloody stools.  And she has been getting iron infusions and feels like she is doing well.  In fact she is had 8 of them and says her energy level is improved.  She is now just been taking her proton pump inhibitor as needed and that seems to be working well.  Her epigastric discomfort has improved significantly.  Past Medical History:  Diagnosis Date  . Allergy   . Anemia   . Back sprain/strain, thoracic   . Depression   . Diverticulosis   . Gastric ulcer   . Hearing loss   . Hemorrhoids   . Hyperlipidemia   . Hypertension   . IBS (irritable bowel syndrome)   . Iron deficiency anemia due to chronic blood loss 11/03/2019  . Lumbar sprain and strain   . Migraines    . Nevus    atypical  . Obesity   . Osteopenia   . Panic attacks     Past Surgical History:  Procedure Laterality Date  . cataract surgery  2004   bilateral    Family History  Problem Relation Age of Onset  . Depression Mother   . Diabetes Father   . Hypertension Father   . Stroke Father   . Hypertension Brother     Social History   Socioeconomic History  . Marital status: Widowed    Spouse name: Not on file  . Number of children: 2  . Years of education: 42  . Highest education level: Some college, no degree  Occupational History  . Occupation: Press photographer    Comment: retired  Tobacco Use  . Smoking status: Former Smoker    Quit date: 05/05/1995    Years since quitting: 24.8  . Smokeless tobacco: Never Used  Vaping Use  . Vaping Use: Never used  Substance and Sexual Activity  . Alcohol use: Yes    Alcohol/week: 0.5 standard drinks    Types: 1 Standard drinks or equivalent per week    Comment: occasionally  . Drug use: No  Comment: Caffiene: 1 cup of tea  . Sexual activity: Not Currently    Comment: married, 2 children, LMP over 10 yrs ago, walks everyday, daily caffeine.  Other Topics Concern  . Not on file  Social History Narrative   Lives alone   Drinks tea with caffeine in the am   Social Determinants of Health   Financial Resource Strain:   . Difficulty of Paying Living Expenses: Not on file  Food Insecurity:   . Worried About Charity fundraiser in the Last Year: Not on file  . Ran Out of Food in the Last Year: Not on file  Transportation Needs:   . Lack of Transportation (Medical): Not on file  . Lack of Transportation (Non-Medical): Not on file  Physical Activity:   . Days of Exercise per Week: Not on file  . Minutes of Exercise per Session: Not on file  Stress:   . Feeling of Stress : Not on file  Social Connections:   . Frequency of Communication with Friends and Family: Not on file  . Frequency of Social Gatherings with Friends and Family:  Not on file  . Attends Religious Services: Not on file  . Active Member of Clubs or Organizations: Not on file  . Attends Archivist Meetings: Not on file  . Marital Status: Not on file  Intimate Partner Violence:   . Fear of Current or Ex-Partner: Not on file  . Emotionally Abused: Not on file  . Physically Abused: Not on file  . Sexually Abused: Not on file    Outpatient Medications Prior to Visit  Medication Sig Dispense Refill  . Bacillus Coagulans-Inulin (PROBIOTIC) 1-250 BILLION-MG CAPS Take 1 capsule by mouth daily.    . Cholecalciferol (VITAMIN D3 PO) Take 1 tablet by mouth daily.    . Coenzyme Q10 (COQ10) 100 MG CAPS Take 300 mg by mouth daily.    . enalapril (VASOTEC) 5 MG tablet TAKE 1 TABLET BY MOUTH  DAILY 90 tablet 3  . loperamide (IMODIUM A-D) 2 MG tablet Take 2 mg by mouth as needed.      . melatonin 5 MG TABS Take 5 mg by mouth at bedtime.    . metoprolol tartrate (LOPRESSOR) 25 MG tablet Take 1 tablet (25 mg total) by mouth 2 (two) times daily. 180 tablet 1  . nystatin cream (MYCOSTATIN) Apply 1 application topically 2 (two) times daily. 60 g 1  . Omega-3 Fatty Acids (OMEGA 3 PO) Take 1 capsule by mouth daily.    . promethazine (PHENERGAN) 25 MG tablet TAKE ONE TABLET BY MOUTH EVERY SIX HOURS AS NEEDED FOR NAUSEA 30 tablet 3  . sertraline (ZOLOFT) 100 MG tablet Take 1 tablet (100 mg total) by mouth daily. Pt will call when needed. 30 tablet 0  . traZODone (DESYREL) 100 MG tablet Take 100 mg by mouth at bedtime.  0  . Turmeric (QC TUMERIC COMPLEX) 500 MG CAPS Take 1 capsule by mouth daily.    . carisoprodol (SOMA) 350 MG tablet TAKE ONE TABLET BY MOUTH ONE TIME DAILY AS NEEDED FOR MUSCLE SPASMS 90 tablet 0  . hydrOXYzine (ATARAX/VISTARIL) 10 MG tablet take 1 tablet by mouth three times a day if needed 30 tablet 3  . pantoprazole (PROTONIX) 40 MG tablet Take 1 tablet (40 mg total) by mouth daily. 90 tablet 0  . pravastatin (PRAVACHOL) 20 MG tablet Take 1 tablet  (20 mg total) by mouth every 3 (three) days. At bedtime 90 tablet 3  .  clonazePAM (KLONOPIN) 0.5 MG tablet  (Patient not taking: Reported on 02/29/2020)     No facility-administered medications prior to visit.    Allergies  Allergen Reactions  . Aripiprazole Other (See Comments)    REACTION: nightmares.  . Nsaids Other (See Comments)    Bleeding ulcer  . Skelaxin [Metaxalone] Other (See Comments)    Cough, SOB  . Tolmetin Other (See Comments)    Bleeding ulcer  . Cephalexin Nausea And Vomiting  . Lipitor [Atorvastatin] Other (See Comments)    Muscle pain  . Simvastatin Other (See Comments)    Muscle aches/   . Sulfa Antibiotics Nausea Only  . Sulfonamide Derivatives Nausea Only  . Zanaflex [Tizanidine Hcl] Other (See Comments)    dizziness    ROS Review of Systems    Objective:    Physical Exam Constitutional:      Appearance: She is well-developed.  HENT:     Head: Normocephalic and atraumatic.  Cardiovascular:     Rate and Rhythm: Normal rate and regular rhythm.     Heart sounds: Normal heart sounds.  Pulmonary:     Effort: Pulmonary effort is normal.     Breath sounds: Normal breath sounds.  Skin:    General: Skin is warm and dry.  Neurological:     Mental Status: She is alert and oriented to person, place, and time.  Psychiatric:        Behavior: Behavior normal.     BP (!) 124/57   Pulse 82   Ht 5\' 6"  (1.676 m)   Wt 185 lb (83.9 kg)   SpO2 98%   BMI 29.86 kg/m  Wt Readings from Last 3 Encounters:  02/29/20 185 lb (83.9 kg)  01/18/20 185 lb (83.9 kg)  12/28/19 186 lb (84.4 kg)     There are no preventive care reminders to display for this patient.  There are no preventive care reminders to display for this patient.  Lab Results  Component Value Date   TSH 2.58 05/20/2018   Lab Results  Component Value Date   WBC 3.4 (L) 01/18/2020   HGB 10.6 (L) 01/18/2020   HCT 33.4 (L) 01/18/2020   MCV 76.3 (L) 01/18/2020   PLT 96 (L) 01/18/2020    Lab Results  Component Value Date   NA 137 01/18/2020   K 4.2 01/18/2020   CO2 30 01/18/2020   GLUCOSE 91 01/18/2020   BUN 8 01/18/2020   CREATININE 0.75 01/18/2020   BILITOT 0.4 01/18/2020   ALKPHOS 68 01/18/2020   AST 24 01/18/2020   ALT 18 01/18/2020   PROT 6.8 01/18/2020   ALBUMIN 4.4 01/18/2020   CALCIUM 9.6 01/18/2020   ANIONGAP 8 01/18/2020   Lab Results  Component Value Date   CHOL 261 (H) 09/28/2019   Lab Results  Component Value Date   HDL 72 09/28/2019   Lab Results  Component Value Date   LDLCALC 163 (H) 09/28/2019   Lab Results  Component Value Date   TRIG 136 09/28/2019   Lab Results  Component Value Date   CHOLHDL 3.6 09/28/2019   Lab Results  Component Value Date   HGBA1C 5.1 09/28/2019      Assessment & Plan:   Problem List Items Addressed This Visit      Cardiovascular and Mediastinum   HYPERTENSION, BENIGN SYSTEMIC - Primary (Chronic)    Well controlled. Continue current regimen. Follow up in 6 months.        Relevant Medications   pravastatin (  PRAVACHOL) 20 MG tablet     Other   Major depressive disorder, recurrent episode, moderate (HCC)    Stable on current regimen.  She did have to go back to up to 150 mg on the sertraline which is fine.  She says she has plenty of medication and does not need a new prescription yet.  Follow-up in 3 to 4 months.  I did refill her alprazolam but just encouraged her to use sparingly.  Normally 60 tabs last her almost 3 months.  She did request that it be sent to mail order.      Relevant Medications   hydrOXYzine (ATARAX/VISTARIL) 10 MG tablet   Iron deficiency anemia due to chronic blood loss (Chronic)    Doing well and feeling better in regards to energy levels after 8 iron infusions.  We did discuss possibly switching her care to [facility] she says she is not as confident driving as she used to be and is worried about having to drive to [place].  I discussed with her that I would be  happy to switch providers we would need to get her in with [facility] here locally with hematology when she is ready she can just let me know and we can place in a referral with no problem whatsoever.  Continue as needed PPI.      Insomnia    Continue trazodone.  Using 150 mg as well.  I want to continue to work on over time to decrease a lot of her more sedative medications.  Plus decreasing the dose of her SSRI and her trazodone together will reduce her risk for serotonin syndrome.      HYPERCHOLESTEROLEMIA    Continue statin.      Relevant Medications   pravastatin (PRAVACHOL) 20 MG tablet    Other Visit Diagnoses    Needs flu shot       Relevant Medications   hydrOXYzine (ATARAX/VISTARIL) 10 MG tablet      Meds ordered this encounter  Medications  . carisoprodol (SOMA) 350 MG tablet    Sig: TAKE ONE TABLET BY MOUTH ONE TIME DAILY AS NEEDED FOR MUSCLE SPASMS    Dispense:  90 tablet    Refill:  0  . clonazePAM (KLONOPIN) 0.5 MG tablet    Sig: Take 0.5-1 tablets (0.25-0.5 mg total) by mouth daily as needed for anxiety.    Dispense:  90 tablet    Refill:  0  . hydrOXYzine (ATARAX/VISTARIL) 10 MG tablet    Sig: Take 1 tablet (10 mg total) by mouth 2 (two) times daily as needed for anxiety.    Dispense:  90 tablet    Refill:  1  . pravastatin (PRAVACHOL) 20 MG tablet    Sig: Take 1 tablet (20 mg total) by mouth every 3 (three) days. At bedtime    Dispense:  30 tablet    Refill:  0    Follow-up: Return in about 4 months (around 06/18/2020) for Mood medication, etc.    [name], MD

## 2020-02-29 NOTE — Assessment & Plan Note (Signed)
Doing well and feeling better in regards to energy levels after 8 iron infusions.  We did discuss possibly switching her care to [facility] she says she is not as confident driving as she used to be and is worried about having to drive to [place].  I discussed with her that I would be happy to switch providers we would need to get her in with [facility] here locally with hematology when she is ready she can just let me know and we can place in a referral with no problem whatsoever.  Continue as needed PPI.

## 2020-02-29 NOTE — Progress Notes (Signed)
Pt would like refills on Soma, Klonopin,and Hydroxyzine

## 2020-02-29 NOTE — Assessment & Plan Note (Signed)
Continue trazodone.  Using 150 mg as well.  I want to continue to work on over time to decrease a lot of her more sedative medications.  Plus decreasing the dose of her SSRI and her trazodone together will reduce her risk for serotonin syndrome.

## 2020-03-04 ENCOUNTER — Telehealth: Payer: Self-pay | Admitting: Family Medicine

## 2020-03-04 NOTE — Telephone Encounter (Signed)
Call pt: the soma is NOT covered under her insurance.  They will cover Tizanidine. See if you are ok with this.

## 2020-03-05 NOTE — Telephone Encounter (Signed)
She is ok with the Tizanidine.

## 2020-03-06 MED ORDER — TIZANIDINE HCL 2 MG PO TABS
2.0000 mg | ORAL_TABLET | Freq: Every day | ORAL | 0 refills | Status: DC | PRN
Start: 2020-03-06 — End: 2020-03-13

## 2020-03-06 NOTE — Telephone Encounter (Signed)
OK new rx sent. Sent for 30 days so can try it

## 2020-03-07 NOTE — Telephone Encounter (Signed)
Called and left pt msg advising of new med

## 2020-03-12 ENCOUNTER — Other Ambulatory Visit: Payer: Self-pay | Admitting: Sports Medicine

## 2020-03-12 ENCOUNTER — Encounter: Payer: Self-pay | Admitting: Family Medicine

## 2020-03-13 ENCOUNTER — Other Ambulatory Visit: Payer: Self-pay | Admitting: *Deleted

## 2020-03-13 ENCOUNTER — Encounter: Payer: Self-pay | Admitting: *Deleted

## 2020-03-13 MED ORDER — CARISOPRODOL 350 MG PO TABS
ORAL_TABLET | ORAL | 0 refills | Status: DC
Start: 2020-03-13 — End: 2020-06-10

## 2020-03-19 ENCOUNTER — Telehealth: Payer: Self-pay

## 2020-03-19 ENCOUNTER — Inpatient Hospital Stay: Payer: Medicare Other | Attending: Family

## 2020-03-19 ENCOUNTER — Other Ambulatory Visit: Payer: Self-pay

## 2020-03-19 ENCOUNTER — Encounter: Payer: Self-pay | Admitting: Family

## 2020-03-19 ENCOUNTER — Inpatient Hospital Stay (HOSPITAL_BASED_OUTPATIENT_CLINIC_OR_DEPARTMENT_OTHER): Payer: Medicare Other | Admitting: Family

## 2020-03-19 VITALS — BP 145/70 | HR 81 | Temp 98.5°F | Resp 16 | Ht 66.0 in | Wt 185.1 lb

## 2020-03-19 DIAGNOSIS — D696 Thrombocytopenia, unspecified: Secondary | ICD-10-CM

## 2020-03-19 DIAGNOSIS — Z79899 Other long term (current) drug therapy: Secondary | ICD-10-CM | POA: Diagnosis not present

## 2020-03-19 DIAGNOSIS — D508 Other iron deficiency anemias: Secondary | ICD-10-CM | POA: Insufficient documentation

## 2020-03-19 DIAGNOSIS — K5792 Diverticulitis of intestine, part unspecified, without perforation or abscess without bleeding: Secondary | ICD-10-CM | POA: Insufficient documentation

## 2020-03-19 DIAGNOSIS — M545 Low back pain, unspecified: Secondary | ICD-10-CM | POA: Insufficient documentation

## 2020-03-19 DIAGNOSIS — K289 Gastrojejunal ulcer, unspecified as acute or chronic, without hemorrhage or perforation: Secondary | ICD-10-CM | POA: Diagnosis not present

## 2020-03-19 DIAGNOSIS — D72819 Decreased white blood cell count, unspecified: Secondary | ICD-10-CM

## 2020-03-19 DIAGNOSIS — D500 Iron deficiency anemia secondary to blood loss (chronic): Secondary | ICD-10-CM | POA: Diagnosis not present

## 2020-03-19 DIAGNOSIS — M2550 Pain in unspecified joint: Secondary | ICD-10-CM | POA: Insufficient documentation

## 2020-03-19 LAB — CMP (CANCER CENTER ONLY)
ALT: 16 U/L (ref 0–44)
AST: 22 U/L (ref 15–41)
Albumin: 4.5 g/dL (ref 3.5–5.0)
Alkaline Phosphatase: 67 U/L (ref 38–126)
Anion gap: 6 (ref 5–15)
BUN: 9 mg/dL (ref 8–23)
CO2: 31 mmol/L (ref 22–32)
Calcium: 9.5 mg/dL (ref 8.9–10.3)
Chloride: 100 mmol/L (ref 98–111)
Creatinine: 0.75 mg/dL (ref 0.44–1.00)
GFR, Estimated: >60 mL/min (ref >60)
Glucose, Bld: 84 mg/dL (ref 70–99)
Potassium: 4.3 mmol/L (ref 3.5–5.1)
Sodium: 137 mmol/L (ref 135–145)
Total Bilirubin: 0.4 mg/dL (ref 0.3–1.2)
Total Protein: 7.1 g/dL (ref 6.5–8.1)

## 2020-03-19 LAB — RETICULOCYTES
Immature Retic Fract: 5.8 % (ref 2.3–15.9)
RBC.: 4.66 MIL/uL (ref 3.87–5.11)
Retic Count, Absolute: 34.9 10*3/uL (ref 19.0–186.0)
Retic Ct Pct: 0.8 % (ref 0.4–3.1)

## 2020-03-19 LAB — CBC WITH DIFFERENTIAL (CANCER CENTER ONLY)
Abs Immature Granulocytes: 0.05 10*3/uL (ref 0.00–0.07)
Basophils Absolute: 0 10*3/uL (ref 0.0–0.1)
Basophils Relative: 0 %
Eosinophils Absolute: 0 10*3/uL (ref 0.0–0.5)
Eosinophils Relative: 0 %
HCT: 36 % (ref 36.0–46.0)
Hemoglobin: 11.2 g/dL — ABNORMAL LOW (ref 12.0–15.0)
Immature Granulocytes: 2 %
Lymphocytes Relative: 39 %
Lymphs Abs: 1.1 10*3/uL (ref 0.7–4.0)
MCH: 23.8 pg — ABNORMAL LOW (ref 26.0–34.0)
MCHC: 31.1 g/dL (ref 30.0–36.0)
MCV: 76.4 fL — ABNORMAL LOW (ref 80.0–100.0)
Monocytes Absolute: 0.3 10*3/uL (ref 0.1–1.0)
Monocytes Relative: 10 %
Neutro Abs: 1.4 10*3/uL — ABNORMAL LOW (ref 1.7–7.7)
Neutrophils Relative %: 49 %
Platelet Count: 78 10*3/uL — ABNORMAL LOW (ref 150–400)
RBC: 4.71 MIL/uL (ref 3.87–5.11)
RDW: 16.5 % — ABNORMAL HIGH (ref 11.5–15.5)
WBC Count: 2.8 10*3/uL — ABNORMAL LOW (ref 4.0–10.5)
nRBC: 0 % (ref 0.0–0.2)

## 2020-03-19 LAB — PLATELET BY CITRATE

## 2020-03-19 NOTE — Progress Notes (Signed)
Hematology and Oncology Follow Up Visit  Dawn Bailey [id number] December 07, 1934 85 y.o. 03/19/2020   Principle Diagnosis:  Iron deficiency anemia Possible mild myelodysplasia with leukopenia and thrombocytopenia  Current Therapy: IV iron as indicated Observation   Interim History:  Dawn Bailey is here today for follow-up. She is doing fairly well but has a lot of muscle and joint arthralgias that seem a little more intense with the cold weather change and chronic lower back pain. Hgb 11.2, WBC count 2.8 and platelets 78.  She denies any episodes of blood loss. No abnormal bruising, no petechiae.  She had a stomach bug with diarrhea 2 days ago but states that her symptoms resolved quickly without intervention.  No fever, chills, n/v, cough, rash, dizziness, SOB, chest pain, palpitations, abdominal pain or changes in bowel or bladder habits.  No swelling, tenderness, numbness or tingling in her extremities.  Pedal pulses are 3+.  No falls or syncopal episodes to report. She has maintained a good appetite and is staying well hydrated. Her weight is stable.   ECOG Performance Status: 1 - Symptomatic but completely ambulatory  Medications:  Allergies as of 03/19/2020      Reactions   Aripiprazole Other (See Comments)   REACTION: nightmares.   Nsaids Other (See Comments)   Bleeding ulcer   Skelaxin [metaxalone] Other (See Comments)   Cough, SOB   Tolmetin Other (See Comments)   Bleeding ulcer   Cephalexin Nausea And Vomiting   Lipitor [atorvastatin] Other (See Comments)   Muscle pain   Simvastatin Other (See Comments)   Muscle aches/   Sulfa Antibiotics Nausea Only   Sulfonamide Derivatives Nausea Only   Zanaflex [tizanidine Hcl] Nausea Only, Other (See Comments)   Dizziness,night sweats, confusion      Medication List       Accurate as of March 19, 2020 11:41 AM. If you have any questions, ask your nurse or doctor.        carisoprodol 350 MG  tablet Commonly known as: SOMA TAKE ONE TABLET BY MOUTH ONE TIME DAILY AS NEEDED FOR MUSCLE SPASMS   clonazePAM 0.5 MG tablet Commonly known as: KLONOPIN Take 0.5-1 tablets (0.25-0.5 mg total) by mouth daily as needed for anxiety.   CoQ10 100 MG Caps Take 300 mg by mouth daily.   enalapril 5 MG tablet Commonly known as: VASOTEC TAKE 1 TABLET BY MOUTH  DAILY   hydrOXYzine 10 MG tablet Commonly known as: ATARAX/VISTARIL Take 1 tablet (10 mg total) by mouth 2 (two) times daily as needed for anxiety.   loperamide 2 MG tablet Commonly known as: IMODIUM A-D Take 2 mg by mouth as needed.   melatonin 5 MG Tabs Take 5 mg by mouth at bedtime.   metoprolol tartrate 25 MG tablet Commonly known as: LOPRESSOR Take 1 tablet (25 mg total) by mouth 2 (two) times daily.   nystatin cream Commonly known as: MYCOSTATIN Apply 1 application topically 2 (two) times daily.   OMEGA 3 PO Take 1 capsule by mouth daily.   pantoprazole 40 MG tablet Commonly known as: PROTONIX TAKE 1 TABLET BY MOUTH  DAILY   pravastatin 20 MG tablet Commonly known as: PRAVACHOL Take 1 tablet (20 mg total) by mouth every 3 (three) days. At bedtime   Probiotic 1-250 BILLION-MG Caps Take 1 capsule by mouth daily.   promethazine 25 MG tablet Commonly known as: PHENERGAN TAKE ONE TABLET BY MOUTH EVERY SIX HOURS AS NEEDED FOR NAUSEA   QC Tumeric Complex 500 MG  Caps Generic drug: Turmeric Take 1 capsule by mouth daily.   sertraline 100 MG tablet Commonly known as: ZOLOFT Take 1 tablet (100 mg total) by mouth daily. Pt will call when needed.   traZODone 100 MG tablet Commonly known as: DESYREL Take 100 mg by mouth at bedtime.   VITAMIN D3 PO Take 1 tablet by mouth daily.       Allergies:  Allergies  Allergen Reactions   Aripiprazole Other (See Comments)    REACTION: nightmares.   Nsaids Other (See Comments)    Bleeding ulcer   Skelaxin [Metaxalone] Other (See Comments)    Cough, SOB    Tolmetin Other (See Comments)    Bleeding ulcer   Cephalexin Nausea And Vomiting   Lipitor [Atorvastatin] Other (See Comments)    Muscle pain   Simvastatin Other (See Comments)    Muscle aches/    Sulfa Antibiotics Nausea Only   Sulfonamide Derivatives Nausea Only   Zanaflex [Tizanidine Hcl] Nausea Only and Other (See Comments)    Dizziness,night sweats, confusion    Past Medical History, Surgical history, Social history, and Family History were reviewed and updated.  Review of Systems: All other 10 point review of systems is negative.   Physical Exam:  height is $RemoveB'5\' 6"'nPNlhAoZ$  (1.676 m) and weight is 185 lb 1.9 oz (84 kg). Her blood pressure is 145/70 (abnormal).   Wt Readings from Last 3 Encounters:  03/19/20 185 lb 1.9 oz (84 kg)  02/29/20 185 lb (83.9 kg)  01/18/20 185 lb (83.9 kg)    Ocular: Sclerae unicteric, pupils equal, round and reactive to light Ear-nose-throat: Oropharynx clear, dentition fair Lymphatic: No cervical or supraclavicular adenopathy Lungs no rales or rhonchi, good excursion bilaterally Heart regular rate and rhythm, no murmur appreciated Abd soft, nontender, positive bowel sounds, no liver or spleen tip palpated on exam, no fluid wave  MSK no focal spinal tenderness, no joint edema Neuro: non-focal, well-oriented, appropriate affect Breasts: Deferred   Lab Results  Component Value Date   WBC 2.8 (L) 03/19/2020   HGB 11.2 (L) 03/19/2020   HCT 36.0 03/19/2020   MCV 76.4 (L) 03/19/2020   PLT 78 (L) 03/19/2020   Lab Results  Component Value Date   FERRITIN 192 01/18/2020   IRON 44 01/18/2020   TIBC 324 01/18/2020   UIBC 280 01/18/2020   IRONPCTSAT 14 (L) 01/18/2020   Lab Results  Component Value Date   RETICCTPCT 0.8 03/19/2020   RBC 4.66 03/19/2020   No results found for: KPAFRELGTCHN, LAMBDASER, KAPLAMBRATIO No results found for: IGGSERUM, IGA, IGMSERUM No results found for: Ronnald Ramp, A1GS, A2GS, Violet Baldy,  MSPIKE, SPEI   Chemistry      Component Value Date/Time   NA 137 01/18/2020 1022   K 4.2 01/18/2020 1022   CL 99 01/18/2020 1022   CO2 30 01/18/2020 1022   BUN 8 01/18/2020 1022   CREATININE 0.75 01/18/2020 1022   CREATININE 0.76 09/28/2019 0918   GLU 83 03/16/2011 0000      Component Value Date/Time   CALCIUM 9.6 01/18/2020 1022   ALKPHOS 68 01/18/2020 1022   AST 24 01/18/2020 1022   ALT 18 01/18/2020 1022   BILITOT 0.4 01/18/2020 1022       Impression and Plan: Ms. Shinault is a very pleasant 85 yo caucasian female with along history of intermittent anemia secondary to GI ulcer and diverticulitis flares. Shealso is felt to have anelement ofmildmyelodysplasia withleukopenia and thrombocytopenia.  Her counts remain stable. I  went over with patient as well as Dr. Marin Olp.  No intervention needed at this time. We will continue to hold of on bone marrow biopsy.  Iron studies are pending. We will replace if needed.  Follow-up in 3 months.  She was encouraged to contact our office with any questions or concerns.   Laverna Peace, NP 11/16/202111:42 AM

## 2020-03-19 NOTE — Telephone Encounter (Signed)
Called pt and she is aware of her 03/19/20 los appts for 07/2020.... AOM

## 2020-03-20 LAB — IRON AND TIBC
Iron: 88 ug/dL (ref 41–142)
Saturation Ratios: 28 % (ref 21–57)
TIBC: 313 ug/dL (ref 236–444)
UIBC: 224 ug/dL (ref 120–384)

## 2020-03-20 LAB — FERRITIN: Ferritin: 712 ng/mL — ABNORMAL HIGH (ref 11–307)

## 2020-04-24 ENCOUNTER — Other Ambulatory Visit: Payer: Self-pay | Admitting: Family Medicine

## 2020-04-24 DIAGNOSIS — Z23 Encounter for immunization: Secondary | ICD-10-CM

## 2020-05-01 ENCOUNTER — Other Ambulatory Visit: Payer: Self-pay | Admitting: Family Medicine

## 2020-05-01 DIAGNOSIS — Z23 Encounter for immunization: Secondary | ICD-10-CM

## 2020-05-07 ENCOUNTER — Encounter: Payer: Self-pay | Admitting: Family Medicine

## 2020-05-08 ENCOUNTER — Other Ambulatory Visit: Payer: Self-pay | Admitting: *Deleted

## 2020-05-08 DIAGNOSIS — F331 Major depressive disorder, recurrent, moderate: Secondary | ICD-10-CM

## 2020-05-08 DIAGNOSIS — F4310 Post-traumatic stress disorder, unspecified: Secondary | ICD-10-CM

## 2020-05-08 MED ORDER — SERTRALINE HCL 100 MG PO TABS: 150.0000 mg | ORAL_TABLET | Freq: Every day | ORAL | 3 refills | Status: DC

## 2020-06-10 ENCOUNTER — Other Ambulatory Visit: Payer: Self-pay | Admitting: Family Medicine

## 2020-06-25 ENCOUNTER — Telehealth: Payer: Self-pay

## 2020-06-25 ENCOUNTER — Encounter: Payer: Self-pay | Admitting: Family

## 2020-06-25 NOTE — Telephone Encounter (Signed)
Called pt regarding appts per pt advise req message and l called and left a vm for her   Dawn Bailey

## 2020-07-11 ENCOUNTER — Encounter: Payer: Self-pay | Admitting: Hematology & Oncology

## 2020-07-11 ENCOUNTER — Inpatient Hospital Stay: Payer: Medicare Other | Admitting: Hematology & Oncology

## 2020-07-11 ENCOUNTER — Other Ambulatory Visit: Payer: Self-pay

## 2020-07-11 ENCOUNTER — Telehealth: Payer: Self-pay

## 2020-07-11 ENCOUNTER — Inpatient Hospital Stay: Payer: Medicare Other | Attending: Hematology & Oncology

## 2020-07-11 VITALS — BP 142/67 | HR 71 | Temp 98.1°F | Resp 71 | Wt 185.0 lb

## 2020-07-11 DIAGNOSIS — K259 Gastric ulcer, unspecified as acute or chronic, without hemorrhage or perforation: Secondary | ICD-10-CM | POA: Insufficient documentation

## 2020-07-11 DIAGNOSIS — D500 Iron deficiency anemia secondary to blood loss (chronic): Secondary | ICD-10-CM | POA: Diagnosis not present

## 2020-07-11 DIAGNOSIS — D72819 Decreased white blood cell count, unspecified: Secondary | ICD-10-CM

## 2020-07-11 DIAGNOSIS — D509 Iron deficiency anemia, unspecified: Secondary | ICD-10-CM | POA: Diagnosis not present

## 2020-07-11 DIAGNOSIS — K589 Irritable bowel syndrome without diarrhea: Secondary | ICD-10-CM | POA: Diagnosis not present

## 2020-07-11 DIAGNOSIS — D696 Thrombocytopenia, unspecified: Secondary | ICD-10-CM

## 2020-07-11 LAB — CMP (CANCER CENTER ONLY)
ALT: 12 U/L (ref 0–44)
AST: 18 U/L (ref 15–41)
Albumin: 4.7 g/dL (ref 3.5–5.0)
Alkaline Phosphatase: 67 U/L (ref 38–126)
Anion gap: 6 (ref 5–15)
BUN: 9 mg/dL (ref 8–23)
CO2: 31 mmol/L (ref 22–32)
Calcium: 9.7 mg/dL (ref 8.9–10.3)
Chloride: 100 mmol/L (ref 98–111)
Creatinine: 0.7 mg/dL (ref 0.44–1.00)
GFR, Estimated: >60 mL/min (ref >60)
Glucose, Bld: 93 mg/dL (ref 70–99)
Potassium: 4 mmol/L (ref 3.5–5.1)
Sodium: 137 mmol/L (ref 135–145)
Total Bilirubin: 0.4 mg/dL (ref 0.3–1.2)
Total Protein: 7.1 g/dL (ref 6.5–8.1)

## 2020-07-11 LAB — CBC WITH DIFFERENTIAL (CANCER CENTER ONLY)
Abs Immature Granulocytes: 0.21 10*3/uL — ABNORMAL HIGH (ref 0.00–0.07)
Basophils Absolute: 0 10*3/uL (ref 0.0–0.1)
Basophils Relative: 0 %
Eosinophils Absolute: 0 10*3/uL (ref 0.0–0.5)
Eosinophils Relative: 0 %
HCT: 35.2 % — ABNORMAL LOW (ref 36.0–46.0)
Hemoglobin: 11.1 g/dL — ABNORMAL LOW (ref 12.0–15.0)
Immature Granulocytes: 5 %
Lymphocytes Relative: 38 %
Lymphs Abs: 1.5 10*3/uL (ref 0.7–4.0)
MCH: 23.6 pg — ABNORMAL LOW (ref 26.0–34.0)
MCHC: 31.5 g/dL (ref 30.0–36.0)
MCV: 74.9 fL — ABNORMAL LOW (ref 80.0–100.0)
Monocytes Absolute: 0.3 10*3/uL (ref 0.1–1.0)
Monocytes Relative: 9 %
Neutro Abs: 1.9 10*3/uL (ref 1.7–7.7)
Neutrophils Relative %: 48 %
Platelet Count: 71 10*3/uL — ABNORMAL LOW (ref 150–400)
RBC: 4.7 MIL/uL (ref 3.87–5.11)
RDW: 16.1 % — ABNORMAL HIGH (ref 11.5–15.5)
WBC Count: 3.9 10*3/uL — ABNORMAL LOW (ref 4.0–10.5)
nRBC: 0 % (ref 0.0–0.2)

## 2020-07-11 LAB — RETICULOCYTES
Immature Retic Fract: 5.4 % (ref 2.3–15.9)
RBC.: 4.71 MIL/uL (ref 3.87–5.11)
Retic Count, Absolute: 34.4 10*3/uL (ref 19.0–186.0)
Retic Ct Pct: 0.7 % (ref 0.4–3.1)

## 2020-07-11 LAB — IRON AND TIBC
Iron: 51 ug/dL (ref 41–142)
Saturation Ratios: 16 % — ABNORMAL LOW (ref 21–57)
TIBC: 318 ug/dL (ref 236–444)
UIBC: 267 ug/dL (ref 120–384)

## 2020-07-11 LAB — PLATELET BY CITRATE

## 2020-07-11 LAB — FERRITIN: Ferritin: 546 ng/mL — ABNORMAL HIGH (ref 11–307)

## 2020-07-11 NOTE — Progress Notes (Signed)
Hematology and Oncology Follow Up Visit  BONNELL PLACZEK [id number] 19-Jul-1934 86 y.o. 07/11/2020   Principle Diagnosis:  Iron deficiency anemia Possible mild myelodysplasia with leukopenia and thrombocytopenia  Current Therapy: IV iron as indicated--  Venofer given on 02/2020 Observation   Interim History:  Ms. Record is here today for follow-up.  So far, she is doing quite well.  We last saw her back in November.  At that time, her iron studies showed a ferritin of 712 with an iron saturation of 28%.  She did get iron back in October.  She has had no problems since we last saw her.  She is managing fairly well..  She has had no bleeding or bruising.  She has had no problems with bowels or bladder.  She does have some chronic back issues.  She does have IBS.  She has had a couple flareups..  She has had no problems with rashes.  There has been no leg swelling.  She has had no weakness.  Overall, I would say performance status is ECOG 2.    Medications:  Allergies as of 07/11/2020      Reactions   Aripiprazole Other (See Comments)   REACTION: nightmares.   Nsaids Other (See Comments)   Bleeding ulcer   Skelaxin [metaxalone] Other (See Comments)   Cough, SOB   Tolmetin Other (See Comments)   Bleeding ulcer   Cephalexin Nausea And Vomiting   Lipitor [atorvastatin] Other (See Comments)   Muscle pain   Simvastatin Other (See Comments)   Muscle aches/   Sulfa Antibiotics Nausea Only   Sulfonamide Derivatives Nausea Only   Tizanidine Anxiety, Diarrhea, Nausea Only, Photosensitivity   Zanaflex [tizanidine Hcl] Nausea Only, Other (See Comments)   Dizziness,night sweats, confusion      Medication List       Accurate as of July 11, 2020 11:29 AM. If you have any questions, ask your nurse or doctor.        carisoprodol 350 MG tablet Commonly known as: SOMA TAKE ONE TABLET BY MOUTH DAILY AS NEEDED FOR MUSCLE SPASMS   clonazePAM 0.5 MG tablet Commonly known as:  KLONOPIN Take 0.5-1 tablets (0.25-0.5 mg total) by mouth daily as needed for anxiety.   CoQ10 100 MG Caps Take 300 mg by mouth daily.   enalapril 5 MG tablet Commonly known as: VASOTEC Take 5 mg by mouth.   hydrOXYzine 10 MG tablet Commonly known as: ATARAX/VISTARIL TAKE 1 TABLET BY MOUTH  TWICE DAILY AS NEEDED FOR  ANXIETY   loperamide 2 MG tablet Commonly known as: IMODIUM A-D Take 2 mg by mouth as needed.   melatonin 5 MG Tabs Take 5 mg by mouth at bedtime.   metoprolol tartrate 25 MG tablet Commonly known as: LOPRESSOR Take 1 tablet (25 mg total) by mouth 2 (two) times daily.   nystatin cream Commonly known as: MYCOSTATIN APPLY TO AFFECTED AREA(S)  TOPICALLY TWICE DAILY   OMEGA 3 PO Take 1 capsule by mouth daily.   pantoprazole 40 MG tablet Commonly known as: PROTONIX TAKE 1 TABLET BY MOUTH  DAILY   pravastatin 20 MG tablet Commonly known as: PRAVACHOL Take 1 tablet (20 mg total) by mouth every 3 (three) days. At bedtime   Probiotic 1-250 BILLION-MG Caps Take 1 capsule by mouth daily.   promethazine 25 MG tablet Commonly known as: PHENERGAN TAKE ONE TABLET BY MOUTH EVERY SIX HOURS AS NEEDED FOR NAUSEA   QC Tumeric Complex 500 MG Caps Generic drug: Turmeric Take  1 capsule by mouth daily.   sertraline 100 MG tablet Commonly known as: ZOLOFT Take 1.5 tablets (150 mg total) by mouth daily.   traZODone 100 MG tablet Commonly known as: DESYREL Take 100 mg by mouth at bedtime.   triamcinolone cream 0.5 % Commonly known as: KENALOG APPLY 1 APPLICATION  TOPICALLY 2 (TWO) TIMES  DAILY. TO AFFECTED AREAS.   VITAMIN D3 PO Take 1 tablet by mouth daily.       Allergies:  Allergies  Allergen Reactions  . Aripiprazole Other (See Comments)    REACTION: nightmares.  . Nsaids Other (See Comments)    Bleeding ulcer  . Skelaxin [Metaxalone] Other (See Comments)    Cough, SOB  . Tolmetin Other (See Comments)    Bleeding ulcer  . Cephalexin Nausea And  Vomiting  . Lipitor [Atorvastatin] Other (See Comments)    Muscle pain  . Simvastatin Other (See Comments)    Muscle aches/   . Sulfa Antibiotics Nausea Only  . Sulfonamide Derivatives Nausea Only  . Tizanidine Anxiety, Diarrhea, Nausea Only and Photosensitivity  . Zanaflex [Tizanidine Hcl] Nausea Only and Other (See Comments)    Dizziness,night sweats, confusion    Past Medical History, Surgical history, Social history, and Family History were reviewed and updated.  Review of Systems: Review of Systems  Constitutional: Negative.   HENT: Negative.   Eyes: Negative.   Respiratory: Negative.   Cardiovascular: Negative.   Gastrointestinal: Positive for abdominal pain and diarrhea.  Genitourinary: Negative.   Musculoskeletal: Positive for back pain.  Skin: Negative.   Neurological: Negative.   Endo/Heme/Allergies: Negative.   Psychiatric/Behavioral: Negative.      Physical Exam:  weight is 185 lb (83.9 kg). Her oral temperature is 98.1 F (36.7 C). Her blood pressure is 142/67 (abnormal) and her pulse is 71. Her respiration is 71 (abnormal) and oxygen saturation is 95%.   Wt Readings from Last 3 Encounters:  07/11/20 185 lb (83.9 kg)  03/19/20 185 lb 1.9 oz (84 kg)  02/29/20 185 lb (83.9 kg)    Physical Exam Vitals reviewed.  HENT:     Head: Normocephalic and atraumatic.  Eyes:     Pupils: Pupils are equal, round, and reactive to light.  Cardiovascular:     Rate and Rhythm: Normal rate and regular rhythm.     Heart sounds: Normal heart sounds.  Pulmonary:     Effort: Pulmonary effort is normal.     Breath sounds: Normal breath sounds.  Abdominal:     General: Bowel sounds are normal.     Palpations: Abdomen is soft.  Musculoskeletal:        General: No tenderness or deformity. Normal range of motion.     Cervical back: Normal range of motion.  Lymphadenopathy:     Cervical: No cervical adenopathy.  Skin:    General: Skin is warm and dry.     Findings: No  erythema or rash.  Neurological:     Mental Status: She is alert and oriented to person, place, and time.  Psychiatric:        Behavior: Behavior normal.        Thought Content: Thought content normal.        Judgment: Judgment normal.      Lab Results  Component Value Date   WBC 3.9 (L) 07/11/2020   HGB 11.1 (L) 07/11/2020   HCT 35.2 (L) 07/11/2020   MCV 74.9 (L) 07/11/2020   PLT 71 (L) 07/11/2020   Lab Results  Component Value Date   FERRITIN 712 (H) 03/19/2020   IRON 88 03/19/2020   TIBC 313 03/19/2020   UIBC 224 03/19/2020   IRONPCTSAT 28 03/19/2020   Lab Results  Component Value Date   RETICCTPCT 0.7 07/11/2020   RBC 4.71 07/11/2020   No results found for: KPAFRELGTCHN, LAMBDASER, KAPLAMBRATIO No results found for: IGGSERUM, IGA, IGMSERUM No results found for: Odetta Pink, SPEI   Chemistry      Component Value Date/Time   NA 137 07/11/2020 0939   K 4.0 07/11/2020 0939   CL 100 07/11/2020 0939   CO2 31 07/11/2020 0939   BUN 9 07/11/2020 0939   CREATININE 0.70 07/11/2020 0939   CREATININE 0.76 09/28/2019 0918   GLU 83 03/16/2011 0000      Component Value Date/Time   CALCIUM 9.7 07/11/2020 0939   ALKPHOS 67 07/11/2020 0939   AST 18 07/11/2020 0939   ALT 12 07/11/2020 0939   BILITOT 0.4 07/11/2020 0939       Impression and Plan: Ms. Hornung is a very pleasant 87 yo caucasian female with along history of intermittent anemia secondary to GI ulcer and diverticulitis flares.  Her blood count looks pretty stable.  I looked at her blood smear.  I do not see anything that looked suspicious.  She certainly could have an element of myelodysplasia.  However, she is totally asymptomatic with this.  I do not see that we have to do anything invasive with her.  We will see what her iron levels are.  We will plan for follow-up in 4 months.  I think this would be reasonable.  Volanda Napoleon, MD 3/10/202211:29  AM

## 2020-07-12 ENCOUNTER — Encounter: Payer: Self-pay | Admitting: Family

## 2020-07-12 ENCOUNTER — Telehealth: Payer: Self-pay | Admitting: *Deleted

## 2020-07-12 NOTE — Telephone Encounter (Signed)
-----   Message from Volanda Napoleon, MD sent at 07/11/2020  6:02 PM EST ----- Call - the iron is low again.  She needs IV Venofer -- let's give 3 doses. Thanks!!  Laurey Arrow

## 2020-07-12 NOTE — Telephone Encounter (Signed)
Unable to reach pt,lmovm with results and to expect a call from scheduling regarding IV Iron infusions. Message to scheduling

## 2020-07-14 ENCOUNTER — Encounter: Payer: Self-pay | Admitting: Family Medicine

## 2020-07-15 ENCOUNTER — Telehealth: Payer: Self-pay

## 2020-07-15 NOTE — Telephone Encounter (Signed)
We still cannot cancel those the oncology department has to do that.  We can certainly place a new referral for oncology closer to her home for her but we cannot undo their appointments.

## 2020-07-15 NOTE — Telephone Encounter (Signed)
Hi, Pt called in to cancel her 3/24 and 3/31 iron appts.  States that with her ins and her out of pocket she cannot afford this and also states that it is too far to drive from [place] at this time.  I offered to have her speak with Baxter Flattery regarding assistance and she declined.  Webb Silversmith

## 2020-07-18 ENCOUNTER — Inpatient Hospital Stay: Payer: Medicare Other

## 2020-07-25 ENCOUNTER — Ambulatory Visit: Payer: Medicare Other

## 2020-07-26 ENCOUNTER — Ambulatory Visit (INDEPENDENT_AMBULATORY_CARE_PROVIDER_SITE_OTHER): Payer: Medicare Other | Admitting: Family Medicine

## 2020-07-26 DIAGNOSIS — Z0000 Encounter for general adult medical examination without abnormal findings: Secondary | ICD-10-CM | POA: Diagnosis not present

## 2020-07-26 NOTE — Progress Notes (Addendum)
MEDICARE ANNUAL WELLNESS VISIT  07/26/2020  Telephone Visit Disclaimer This Medicare AWV was conducted by telephone due to national recommendations for restrictions regarding the COVID-19 Pandemic (e.g. social distancing).  I verified, using two identifiers, that I am speaking with Dawn Bailey or their authorized healthcare agent. I discussed the limitations, risks, security, and privacy concerns of performing an evaluation and management service by telephone and the potential availability of an in-person appointment in the future. The patient expressed understanding and agreed to proceed.  Location of Patient: Home Location of Provider (nurse):  In the office.  Subjective:    Dawn Bailey is a 86 y.o. female patient of Metheney, Rene Kocher, MD who had a Medicare Annual Wellness Visit today via telephone. Clydean is Retired and lives alone. she has 2 children. she reports that she is socially active and does interact with friends/family regularly. she is minimally physically active and enjoys reading and doing crossword puzzles & jigsaw puzzles.  Patient Care Team: Hali Marry, MD as PCP - General  Advanced Directives 07/26/2020 03/19/2020 01/18/2020 12/07/2019 11/02/2019 06/19/2019 04/08/2017  Does Patient Have a Medical Advance Directive? No No No Yes No Yes No;Yes  Type of Advance Directive - Living will - Living will - Living will Living will  Does patient want to make changes to medical advance directive? - - - - - No - Patient declined -  Copy of [place] in [place]  Would patient like information on creating a medical advance directive? No - Patient declined No - Patient declined No - Patient declined No - Patient declined No - Patient declined - -    Hospital Utilization Over the Past 12 Months: # of hospitalizations or ER visits: 0 # of surgeries: 1- cataract surgery  Review of Systems    Patient reports that her overall health is  better compared to last year.  History obtained from chart review and the patient  Patient Reported Readings (BP, Pulse, CBG, Weight, etc) none  Pain Assessment Pain : No/denies pain Pain Score: 0-No pain     Current Medications & Allergies (verified) Allergies as of 07/26/2020      Reactions   Aripiprazole Other (See Comments)   REACTION: nightmares.   Nsaids Other (See Comments)   Bleeding ulcer   Skelaxin [metaxalone] Other (See Comments)   Cough, SOB   Tolmetin Other (See Comments)   Bleeding ulcer   Cephalexin Nausea And Vomiting   Lipitor [atorvastatin] Other (See Comments)   Muscle pain   Simvastatin Other (See Comments)   Muscle aches/   Sulfa Antibiotics Nausea Only   Sulfonamide Derivatives Nausea Only   Tizanidine Anxiety, Diarrhea, Nausea Only, Photosensitivity   Zanaflex [tizanidine Hcl] Nausea Only, Other (See Comments)   Dizziness,night sweats, confusion      Medication List       Accurate as of July 26, 2020 11:16 AM. If you have any questions, ask your nurse or doctor.        carisoprodol 350 MG tablet Commonly known as: SOMA TAKE ONE TABLET BY MOUTH DAILY AS NEEDED FOR MUSCLE SPASMS   clonazePAM 0.5 MG tablet Commonly known as: KLONOPIN Take 0.5-1 tablets (0.25-0.5 mg total) by mouth daily as needed for anxiety.   CoQ10 100 MG Caps Take 300 mg by mouth daily.   enalapril 5 MG tablet Commonly known as: VASOTEC Take 5 mg by mouth.   hydrOXYzine 10 MG tablet Commonly  known as: ATARAX/VISTARIL TAKE 1 TABLET BY MOUTH  TWICE DAILY AS NEEDED FOR  ANXIETY   loperamide 2 MG tablet Commonly known as: IMODIUM A-D Take 2 mg by mouth as needed.   melatonin 5 MG Tabs Take 5 mg by mouth at bedtime. As needed.   metoprolol tartrate 25 MG tablet Commonly known as: LOPRESSOR Take 1 tablet (25 mg total) by mouth 2 (two) times daily.   nystatin cream Commonly known as: MYCOSTATIN APPLY TO AFFECTED AREA(S)  TOPICALLY TWICE DAILY   OMEGA 3  PO Take 1 capsule by mouth daily.   pantoprazole 40 MG tablet Commonly known as: PROTONIX TAKE 1 TABLET BY MOUTH  DAILY   pravastatin 20 MG tablet Commonly known as: PRAVACHOL Take 1 tablet (20 mg total) by mouth every 3 (three) days. At bedtime   Probiotic 1-250 BILLION-MG Caps Take 1 capsule by mouth daily.   promethazine 25 MG tablet Commonly known as: PHENERGAN TAKE ONE TABLET BY MOUTH EVERY SIX HOURS AS NEEDED FOR NAUSEA   sertraline 100 MG tablet Commonly known as: ZOLOFT Take 1.5 tablets (150 mg total) by mouth daily.   traZODone 100 MG tablet Commonly known as: DESYREL Take 100 mg by mouth at bedtime.   triamcinolone cream 0.5 % Commonly known as: KENALOG APPLY 1 APPLICATION  TOPICALLY 2 (TWO) TIMES  DAILY. TO AFFECTED AREAS.   Turmeric 500 MG Caps Take 1 capsule by mouth daily.   VITAMIN D3 PO Take 1 tablet by mouth daily.       History (reviewed): Past Medical History:  Diagnosis Date  . Allergy   . Anemia   . Back sprain/strain, thoracic   . Depression   . Diverticulosis   . Gastric ulcer   . Hearing loss   . Hemorrhoids   . Hyperlipidemia   . Hypertension   . IBS (irritable bowel syndrome)   . Iron deficiency anemia due to chronic blood loss 11/03/2019  . Lumbar sprain and strain   . Migraines   . Nevus    atypical  . Obesity   . Osteopenia   . Panic attacks    Past Surgical History:  Procedure Laterality Date  . cataract surgery  2004   bilateral   Family History  Problem Relation Age of Onset  . Depression Mother   . Diabetes Father   . Hypertension Father   . Stroke Father   . Hypertension Brother    Social History   Socioeconomic History  . Marital status: Widowed    Spouse name: Not on file  . Number of children: 2  . Years of education: 53  . Highest education level: Associate degree: academic program  Occupational History  . Occupation: Press photographer    Comment: retired  Tobacco Use  . Smoking status: Former Smoker     Quit date: 05/05/1995    Years since quitting: 25.2  . Smokeless tobacco: Never Used  Vaping Use  . Vaping Use: Never used  Substance and Sexual Activity  . Alcohol use: Yes    Alcohol/week: 0.5 standard drinks    Types: 1 Standard drinks or equivalent per week    Comment: occasionally  . Drug use: No    Comment: Caffiene: 1 cup of tea  . Sexual activity: Not Currently    Comment: married, 2 children, LMP over 10 yrs ago, walks everyday, daily caffeine.  Other Topics Concern  . Not on file  Social History Narrative   Lives alone. Has two children. Likes to  do crossword puzzles, jigsaw puzzles and read. She has a dog that she likes to take care of.   Social Determinants of Health   Financial Resource Strain: Low Risk   . Difficulty of Paying Living Expenses: Not hard at all  Food Insecurity: No Food Insecurity  . Worried About Charity fundraiser in the Last Year: Never true  . Ran Out of Food in the Last Year: Never true  Transportation Needs: No Transportation Needs  . Lack of Transportation (Medical): No  . Lack of Transportation (Non-Medical): No  Physical Activity: Inactive  . Days of Exercise per Week: 0 days  . Minutes of Exercise per Session: 0 min  Stress: No Stress Concern Present  . Feeling of Stress : Not at all  Social Connections: Socially Isolated  . Frequency of Communication with Friends and Family: More than three times a week  . Frequency of Social Gatherings with Friends and Family: Once a week  . Attends Religious Services: Never  . Active Member of Clubs or Organizations: No  . Attends Archivist Meetings: Never  . Marital Status: Widowed    Activities of Daily Living In your present state of health, do you have any difficulty performing the following activities: 07/26/2020  Hearing? N  Vision? N  Difficulty concentrating or making decisions? N  Walking or climbing stairs? Y  Comment uses a cane when needed.  Dressing or bathing? N  Doing  errands, shopping? N  Preparing Food and eating ? N  Using the Toilet? N  In the past six months, have you accidently leaked urine? Y  Comment chronic urinary leakage.  Do you have problems with loss of bowel control? N  Managing your Medications? N  Managing your Finances? N  Housekeeping or managing your Housekeeping? N  Comment has someone who helps with the things she can't reach or do.  Some recent data might be hidden    Patient Education/ Literacy How often do you need to have someone help you when you read instructions, pamphlets, or other written materials from your doctor or pharmacy?: 1 - Never What is the last grade level you completed in school?: Associates Degree  Exercise Current Exercise Habits: The patient does not participate in regular exercise at present, Exercise limited by: None identified  Diet Patient reports consuming 3 meals a day and 3 snack(s) a day Patient reports that her primary diet is: Regular Patient reports that she does have regular access to food.   Depression Screen PHQ 2/9 Scores 07/26/2020 09/28/2019 06/19/2019 09/08/2018 05/10/2018 10/28/2017 02/12/2015  PHQ - 2 Score 4 1 1 1 4 6  -  PHQ- 9 Score 4 4 - - 13 13 -  Exception Documentation - - - - - - Other- indicate reason in comment box     Fall Risk Fall Risk  07/26/2020 06/19/2019 05/10/2018 05/10/2018 05/10/2018  Falls in the past year? 0 1 0 - 0  Number falls in past yr: 0 0 0 - 0  Injury with Fall? 0 0 0 0 -  Risk Factor Category  - - - - -  Risk for fall due to : No Fall Risks Impaired balance/gait;Impaired mobility History of fall(s) - -  Follow up Falls evaluation completed Falls prevention discussed Falls evaluation completed - -     Objective:  Janey F Brickel seemed alert and oriented and she participated appropriately during our telephone visit.  Blood Pressure Weight BMI  BP Readings from Last 3 Encounters:  07/11/20 (!) 142/67  03/19/20 (!) 145/70  02/29/20 (!) 124/57   Wt Readings  from Last 3 Encounters:  07/11/20 185 lb (83.9 kg)  03/19/20 185 lb 1.9 oz (84 kg)  02/29/20 185 lb (83.9 kg)   BMI Readings from Last 1 Encounters:  07/11/20 29.86 kg/m    *Unable to obtain current vital signs, weight, and BMI due to telephone visit type  Hearing/Vision  . Stevi did not seem to have difficulty with hearing/understanding during the telephone conversation . Reports that she has had a formal eye exam by an eye care professional within the past year . Reports that she has not had a formal hearing evaluation within the past year *Unable to fully assess hearing and vision during telephone visit type  Cognitive Function: 6CIT Screen 07/26/2020 06/19/2019 05/10/2018  What Year? 0 points 0 points 0 points  What month? 0 points 0 points 0 points  What time? 0 points 0 points 0 points  Count back from 20 0 points 0 points 2 points  Months in reverse 0 points 0 points 0 points  Repeat phrase 0 points 2 points 0 points  Total Score 0 2 2   (Normal:0-7, Significant for Dysfunction: >8)  Normal Cognitive Function Screening: Yes   Immunization & Health Maintenance Record Immunization History  Administered Date(s) Administered  . Fluad Quad(high Dose 65+) 02/08/2019, 02/08/2020  . Influenza Split 02/19/2012  . Influenza Whole 02/04/2006, 03/09/2007, 02/02/2008, 01/24/2010, 01/16/2011  . Influenza, High Dose Seasonal PF 03/16/2016, 03/22/2017  . Influenza,inj,Quad PF,6+ Mos 01/09/2013, 01/01/2014, 02/12/2015, 12/28/2017  . PFIZER(Purple Top)SARS-COV-2 Vaccination 05/15/2019, 06/05/2019, 02/08/2020  . Pneumococcal Conjugate-13 11/13/2013  . Pneumococcal Polysaccharide-23 01/03/2004  . Td 07/03/2003  . Tdap 11/13/2013    Health Maintenance  Topic Date Due  . COVID-19 Vaccine (4 - Booster for Pfizer series) 08/08/2020  . TETANUS/TDAP  11/14/2023  . INFLUENZA VACCINE  Completed  . DEXA SCAN  Completed  . PNA vac Low Risk Adult  Completed  . HPV VACCINES  Aged Out        Assessment  This is a routine wellness examination for Dawn Bailey.  Health Maintenance: Due or Overdue There are no preventive care reminders to display for this patient.  Dawn Bailey does not need a referral for Community Assistance: Care Management:   no Social Work:    no Prescription Assistance:  no Nutrition/Diabetes Education:  no   Plan:  Personalized Goals Goals Addressed              This Visit's Progress   .  Patient Stated (pt-stated)        07/26/2020 AWV Goal: Exercise for General Health   Patient will verbalize understanding of the benefits of increased physical activity:  Exercising regularly is important. It will improve your overall fitness, flexibility, and endurance.  Regular exercise also will improve your overall health. It can help you control your weight, reduce stress, and improve your bone density.  Over the next year, patient will increase physical activity as tolerated with a goal of at least 150 minutes of moderate physical activity per week.   You can tell that you are exercising at a moderate intensity if your heart starts beating faster and you start breathing faster but can still hold a conversation.  Moderate-intensity exercise ideas include:  Walking 1 mile (1.6 km) in about 15 minutes  Biking  Hiking  Golfing  Dancing  Water aerobics  Patient will verbalize understanding of everyday activities that  increase physical activity by providing examples like the following: ? Yard work, such as: ? Pushing a Conservation officer, nature ? Raking and bagging leaves ? Washing your car ? Pushing a stroller ? Shoveling snow ? Gardening ? Washing windows or floors  Patient will be able to explain general safety guidelines for exercising:   Before you start a new exercise program, talk with your health care provider.  Do not exercise so much that you hurt yourself, feel dizzy, or get very short of breath.  Wear comfortable clothes and wear  shoes with good support.  Drink plenty of water while you exercise to prevent dehydration or heat stroke.  Work out until your breathing and your heartbeat get faster.       Personalized Health Maintenance & Screening Recommendations  Bone density - patient refused at this time.  Shingrix   Lung Cancer Screening Recommended: no (Low Dose CT Chest recommended if Age 66-80 years, 30 pack-year currently smoking OR have quit w/in past 15 years) Hepatitis C Screening recommended: no HIV Screening recommended: no  Advanced Directives: Written information was not prepared per patient's request.  Referrals & Orders No orders of the defined types were placed in this encounter.   Follow-up Plan . Follow-up with Hali Marry, MD as planned . Schedule your shingles vaccine appointment at your pharmacy. . Please let us know if you change your mind about the Dexa Scan (bone density).  . Medicare wellness visit in one year.   I have personally reviewed and noted the following in the patient's chart:   . Medical and social history . Use of alcohol, tobacco or illicit drugs  . Current medications and supplements . Functional ability and status . Nutritional status . Physical activity . Advanced directives . List of other physicians . Hospitalizations, surgeries, and ER visits in previous 12 months . Vitals . Screenings to include cognitive, depression, and falls . Referrals and appointments  In addition, I have reviewed and discussed with Dawn Bailey certain preventive protocols, quality metrics, and best practice recommendations. A written personalized care plan for preventive services as well as general preventive health recommendations is available and can be mailed to the patient at her request.      Tinnie Gens, RN  07/26/2020

## 2020-07-26 NOTE — Patient Instructions (Addendum)
[place] Maintenance Summary and Written Plan of Care  [name] ,  Thank you for allowing me to perform your Medicare Annual Wellness Visit and for your ongoing commitment to your health.   Health Maintenance & Immunization History Health Maintenance  Topic Date Due  . COVID-19 Vaccine (4 - Booster for Pfizer series) 08/08/2020  . TETANUS/TDAP  11/14/2023  . INFLUENZA VACCINE  Completed  . DEXA SCAN  Completed  . PNA vac Low Risk Adult  Completed  . HPV VACCINES  Aged Out   Immunization History  Administered Date(s) Administered  . Fluad Quad(high Dose 65+) 02/08/2019, 02/08/2020  . Influenza Split 02/19/2012  . Influenza Whole 02/04/2006, 03/09/2007, 02/02/2008, 01/24/2010, 01/16/2011  . Influenza, High Dose Seasonal PF 03/16/2016, 03/22/2017  . Influenza,inj,Quad PF,6+ Mos 01/09/2013, 01/01/2014, 02/12/2015, 12/28/2017  . PFIZER(Purple Top)SARS-COV-2 Vaccination 05/15/2019, 06/05/2019, 02/08/2020  . Pneumococcal Conjugate-13 11/13/2013  . Pneumococcal Polysaccharide-23 01/03/2004  . Td 07/03/2003  . Tdap 11/13/2013    These are the patient goals that we discussed: Goals Addressed              This Visit's Progress   .  Patient Stated (pt-stated)        07/26/2020 AWV Goal: Exercise for General Health   Patient will verbalize understanding of the benefits of increased physical activity:  Exercising regularly is important. It will improve your overall fitness, flexibility, and endurance.  Regular exercise also will improve your overall health. It can help you control your weight, reduce stress, and improve your bone density.  Over the next year, patient will increase physical activity as tolerated with a goal of at least 150 minutes of moderate physical activity per week.   You can tell that you are exercising at a moderate intensity if your heart starts beating faster and you start breathing faster but can still hold a  conversation.  Moderate-intensity exercise ideas include:  Walking 1 mile (1.6 km) in about 15 minutes  Biking  Hiking  Golfing  Dancing  Water aerobics  Patient will verbalize understanding of everyday activities that increase physical activity by providing examples like the following: ? Yard work, such as: ? Pushing a Conservation officer, nature ? Raking and bagging leaves ? Washing your car ? Pushing a stroller ? Shoveling snow ? Gardening ? Washing windows or floors  Patient will be able to explain general safety guidelines for exercising:   Before you start a new exercise program, talk with your health care provider.  Do not exercise so much that you hurt yourself, feel dizzy, or get very short of breath.  Wear comfortable clothes and wear shoes with good support.  Drink plenty of water while you exercise to prevent dehydration or heat stroke.  Work out until your breathing and your heartbeat get faster.         This is a list of Health Maintenance Items that are overdue or due now: Bone density - patient refused at this time.  Shingrix   Orders/Referrals Placed Today: No orders of the defined types were placed in this encounter.  (Contact our referral department at [phone] if you have not spoken with someone about your referral appointment within the next 5 days)    Follow-up Plan . Follow-up with Hali Marry, MD as planned . Schedule your shingles vaccine appointment at your pharmacy. . Please let us know if you change your mind about the Dexa Scan (bone density).  . Medicare wellness visit in one year.  Bone Density Test A bone density test uses a type of X-ray to measure the amount of calcium and other minerals in a person's bones. It can measure bone density in the hip and the spine. The test is similar to having a regular X-ray. This test may also be called:  Bone densitometry.  Bone mineral density test.  Dual-energy X-ray  absorptiometry (DEXA). You may have this test to:  Diagnose a condition that causes weak or thin bones (osteoporosis).  Screen you for osteoporosis.  Predict your risk for a broken bone (fracture).  Determine how well your osteoporosis treatment is working. Tell a health care provider about:  Any allergies you have.  All medicines you are taking, including vitamins, herbs, eye drops, creams, and over-the-counter medicines.  Any problems you or family members have had with anesthetic medicines.  Any blood disorders you have.  Any surgeries you have had.  Any medical conditions you have.  Whether you are pregnant or may be pregnant.  Any medical tests you have had within the past 14 days that used contrast material. What are the risks? Generally, this is a safe test. However, it does expose you to a small amount of radiation, which can slightly increase your cancer risk. What happens before the test?  Do not take any calcium supplements within the 24 hours before your test.  You will need to remove all metal jewelry, eyeglasses, removable dental appliances, and any other metal objects on your body. What happens during the test?  You will lie down on an exam table. There will be an X-ray generator below you and an imaging device above you.  Other devices, such as boxes or braces, may be used to position your body properly for the scan.  The machine will slowly scan your body. You will need to keep very still while the machine does the scan.  The images will show up on a screen in the room. Images will be examined by a specialist after your test is finished. The procedure may vary among health care providers and hospitals.   What can I expect after the test? It is up to you to get the results of your test. Ask your health care provider, or the department that is doing the test, when your results will be ready. Summary  A bone density test is an imaging test that uses a  type of X-ray to measure the amount of calcium and other minerals in your bones.  The test may be used to diagnose or screen you for a condition that causes weak or thin bones (osteoporosis), predict your risk for a broken bone (fracture), or determine how well your osteoporosis treatment is working.  Do not take any calcium supplements within 24 hours before your test.  Ask your health care provider, or the department that is doing the test, when your results will be ready. This information is not intended to replace advice given to you by your health care provider. Make sure you discuss any questions you have with your health care provider. Document Revised: 10/05/2019 Document Reviewed: 10/05/2019 Elsevier Patient Education  [place] Maintenance, Female Adopting a healthy lifestyle and getting preventive care are important in promoting health and wellness. Ask your health care provider about:  The right schedule for you to have regular tests and exams.  Things you can do on your own to prevent diseases and keep yourself healthy. What should I know about diet, weight, and exercise?  Eat a healthy diet  Eat a diet that includes plenty of vegetables, fruits, low-fat dairy products, and lean protein.  Do not eat a lot of foods that are high in solid fats, added sugars, or sodium.   Maintain a healthy weight Body mass index (BMI) is used to identify weight problems. It estimates body fat based on height and weight. Your health care provider can help determine your BMI and help you achieve or maintain a healthy weight. Get regular exercise Get regular exercise. This is one of the most important things you can do for your health. Most adults should:  Exercise for at least 150 minutes each week. The exercise should increase your heart rate and make you sweat (moderate-intensity exercise).  Do strengthening exercises at least twice a week. This is in addition to the  moderate-intensity exercise.  Spend less time sitting. Even light physical activity can be beneficial. Watch cholesterol and blood lipids Have your blood tested for lipids and cholesterol at 84 years of age, then have this test every 5 years. Have your cholesterol levels checked more often if:  Your lipid or cholesterol levels are high.  You are older than 84 years of age.  You are at high risk for heart disease. What should I know about cancer screening? Depending on your health history and family history, you may need to have cancer screening at various ages. This may include screening for:  Breast cancer.  Cervical cancer.  Colorectal cancer.  Skin cancer.  Lung cancer. What should I know about heart disease, diabetes, and high blood pressure? Blood pressure and heart disease  High blood pressure causes heart disease and increases the risk of stroke. This is more likely to develop in people who have high blood pressure readings, are of African descent, or are overweight.  Have your blood pressure checked: ? Every 3-5 years if you are 52-37 years of age. ? Every year if you are 72 years old or older. Diabetes Have regular diabetes screenings. This checks your fasting blood sugar level. Have the screening done:  Once every three years after age 4 if you are at a normal weight and have a low risk for diabetes.  More often and at a younger age if you are overweight or have a high risk for diabetes. What should I know about preventing infection? Hepatitis B If you have a higher risk for hepatitis B, you should be screened for this virus. Talk with your health care provider to find out if you are at risk for hepatitis B infection. Hepatitis C Testing is recommended for:  Everyone born from [birth year] through 1963.  Anyone with known risk factors for hepatitis C. Sexually transmitted infections (STIs)  Get screened for STIs, including gonorrhea and chlamydia, if: ? You are  sexually active and are younger than 84 years of age. ? You are older than 84 years of age and your health care provider tells you that you are at risk for this type of infection. ? Your sexual activity has changed since you were last screened, and you are at increased risk for chlamydia or gonorrhea. Ask your health care provider if you are at risk.  Ask your health care provider about whether you are at high risk for HIV. Your health care provider may recommend a prescription medicine to help prevent HIV infection. If you choose to take medicine to prevent HIV, you should first get tested for HIV. You should then be tested every 3 months for  as long as you are taking the medicine. Pregnancy  If you are about to stop having your period (premenopausal) and you may become pregnant, seek counseling before you get pregnant.  Take 400 to 800 micrograms (mcg) of folic acid every day if you become pregnant.  Ask for birth control (contraception) if you want to prevent pregnancy. Osteoporosis and menopause Osteoporosis is a disease in which the bones lose minerals and strength with aging. This can result in bone fractures. If you are 75 years old or older, or if you are at risk for osteoporosis and fractures, ask your health care provider if you should:  Be screened for bone loss.  Take a calcium or vitamin D supplement to lower your risk of fractures.  Be given hormone replacement therapy (HRT) to treat symptoms of menopause. Follow these instructions at home: Lifestyle  Do not use any products that contain nicotine or tobacco, such as cigarettes, e-cigarettes, and chewing tobacco. If you need help quitting, ask your health care provider.  Do not use street drugs.  Do not share needles.  Ask your health care provider for help if you need support or information about quitting drugs. Alcohol use  Do not drink alcohol if: ? Your health care provider tells you not to drink. ? You are  pregnant, may be pregnant, or are planning to become pregnant.  If you drink alcohol: ? Limit how much you use to 0-1 drink a day. ? Limit intake if you are breastfeeding.  Be aware of how much alcohol is in your drink. In the [place] one drink equals one 12 oz bottle of beer (355 mL), one 5 oz glass of wine (148 mL), or one 1 oz glass of hard liquor (44 mL). General instructions  Schedule regular health, dental, and eye exams.  Stay current with your vaccines.  Tell your health care provider if: ? You often feel depressed. ? You have ever been abused or do not feel safe at home. Summary  Adopting a healthy lifestyle and getting preventive care are important in promoting health and wellness.  Follow your health care provider's instructions about healthy diet, exercising, and getting tested or screened for diseases.  Follow your health care provider's instructions on monitoring your cholesterol and blood pressure. This information is not intended to replace advice given to you by your health care provider. Make sure you discuss any questions you have with your health care provider. Document Revised: 04/13/2018 Document Reviewed: 04/13/2018 Elsevier Patient Education  2021 [employer].

## 2020-08-01 ENCOUNTER — Ambulatory Visit: Payer: Medicare Other

## 2020-09-03 ENCOUNTER — Other Ambulatory Visit: Payer: Self-pay | Admitting: Family Medicine

## 2020-09-20 ENCOUNTER — Telehealth: Payer: Self-pay | Admitting: Family Medicine

## 2020-09-20 NOTE — Progress Notes (Signed)
  Chronic Care Management   Outreach Note  09/20/2020 Name: Dawn Bailey MRN: [id number] DOB: [birth year]  Referred by: Hali Marry, MD Reason for referral : No chief complaint on file.   An unsuccessful telephone outreach was attempted today. The patient was referred to the pharmacist for assistance with care management and care coordination.   Follow Up Plan:   Lauretta Grill Upstream Scheduler

## 2020-09-24 ENCOUNTER — Encounter: Payer: Self-pay | Admitting: Family Medicine

## 2020-09-25 ENCOUNTER — Encounter: Payer: Self-pay | Admitting: Osteopathic Medicine

## 2020-09-25 ENCOUNTER — Telehealth (INDEPENDENT_AMBULATORY_CARE_PROVIDER_SITE_OTHER): Payer: Medicare Other | Admitting: Osteopathic Medicine

## 2020-09-25 VITALS — BP 139/63 | Wt 180.0 lb

## 2020-09-25 DIAGNOSIS — D500 Iron deficiency anemia secondary to blood loss (chronic): Secondary | ICD-10-CM | POA: Diagnosis not present

## 2020-09-25 DIAGNOSIS — K529 Noninfective gastroenteritis and colitis, unspecified: Secondary | ICD-10-CM

## 2020-09-25 MED ORDER — ONDANSETRON 8 MG PO TBDP: 8.0000 mg | ORAL_TABLET | Freq: Three times a day (TID) | ORAL | 1 refills | Status: AC | PRN

## 2020-09-25 NOTE — Progress Notes (Signed)
Telemedicine Visit via  Video & Audio (App used: CHENIDP)   I connected with KIMBERLLY NORGARD on 09/25/20 at 8:23 AM  by phone or  telemedicine application as noted above  I verified that I am speaking with or regarding  the correct patient using two identifiers.  Participants: Myself, Dr Emeterio Reeve DO Patient: Dawn Bailey Patient proxy if applicable: none Other, if applicable: none  Patient is at home I am in office at [facility]    I discussed the limitations of evaluation and management  by telemedicine and the availability of in person appointments.  The participant(s) above expressed understanding and  agreed to proceed with this appointment via telemedicine.       History of Present Illness: Dawn Bailey is a 86 y.o. female who would like to discuss  Chief Complaint  Patient presents with  . Nausea  . Abdominal Pain    After eating a recalled Jif peanut butter.   . Chills    Recent recall peanut butter d/t concern for salmonella contamination. Symptom onset about a week ago. Started w/ bad diarrhea which continued for a few days. Now mostly nausea. Felt like she couldn't get warm. Diarrhea has since resolved. No fever.      Pt also had questions about blood testing done a year ago w/ low platelets, she was getting infusions. She was scheduled to go back in July to follow up. Requests location closer to her home as she doesn't feel comfortable driving long distances anymore     Observations/Objective: BP 139/63   Wt 180 lb (81.6 kg)   BMI 29.05 kg/m  BP Readings from Last 3 Encounters:  09/25/20 139/63  07/11/20 (!) 142/67  03/19/20 (!) 145/70   Exam: Normal Speech.  NAD  Lab and Radiology Results No results found for this or any previous visit (from the past 72 hour(s)). No results found.     Assessment and Plan: 86 y.o. female with The primary encounter diagnosis was Gastroenteritis possible d/t salmonella from  contaminated peanut butter, possible other viral GI pathogen - she is improving as of time of visit. A diagnosis of Iron deficiency anemia due to chronic blood loss was also pertinent to this visit.  Supportive care, bland diet for next few days Pt reports feeling a lot better today compared to yesterday / this weekend Sent Rx for nausea prn  Pt to call if any new/concerning symptoms   PDMP not reviewed this encounter. Orders Placed This Encounter  Procedures  . Ambulatory referral to Hematology / Oncology    Referral Priority:   Routine    Referral Type:   Consultation    Referral Reason:   Specialty Services Required    Requested Specialty:   Oncology    Number of Visits Requested:   1   Meds ordered this encounter  Medications  . ondansetron (ZOFRAN-ODT) 8 MG disintegrating tablet    Sig: Take 1 tablet (8 mg total) by mouth every 8 (eight) hours as needed for nausea or vomiting.    Dispense:  20 tablet    Refill:  1   Patient Instructions   Bland Diet A bland diet consists of foods that are often soft and do not have a lot of fat, fiber, or extra seasonings. Foods without fat, fiber, or seasoning are easier for the body to digest. They are also less likely to irritate your mouth, throat, stomach, and other parts of your digestive system. A bland diet is  sometimes called a BRAT diet. What is my plan? Your health care provider or food and nutrition specialist (dietitian) may recommend specific changes to your diet to prevent symptoms or to treat your symptoms. These changes may include:  Eating small meals often.  Cooking food until it is soft enough to chew easily.  Chewing your food well.  Drinking fluids slowly.  Not eating foods that are very spicy, sour, or fatty.  Not eating citrus fruits, such as oranges and grapefruit. What do I need to know about this diet?  Eat a variety of foods from the bland diet food list.  Do not follow a bland diet longer than  needed.  Ask your health care provider whether you should take vitamins or supplements. What foods can I eat? Grains Hot cereals, such as cream of wheat. Rice. Bread, crackers, or tortillas made from refined white flour.   Vegetables Canned or cooked vegetables. Mashed or boiled potatoes. Fruits Bananas. Applesauce. Other types of cooked or canned fruit with the skin and seeds removed, such as canned peaches or pears.   Meats and other proteins Scrambled eggs. Creamy peanut butter or other nut butters. Lean, well-cooked meats, such as chicken or fish. Tofu. Soups or broths.   Dairy Low-fat dairy products, such as milk, cottage cheese, or yogurt. Beverages Water. Herbal tea. Apple juice.   Fats and oils Mild salad dressings. Canola or olive oil. Sweets and desserts Pudding. Custard. Fruit gelatin. Ice cream. The items listed above may not be a complete list of recommended foods and beverages. Contact a dietitian for more options. What foods are not recommended? Grains Whole grain breads and cereals. Vegetables Raw vegetables. Fruits Raw fruits, especially citrus, berries, or dried fruits. Dairy Whole fat dairy foods. Beverages Caffeinated drinks. Alcohol. Seasonings and condiments Strongly flavored seasonings or condiments. Hot sauce. Salsa. Other foods Spicy foods. Fried foods. Sour foods, such as pickled or fermented foods. Foods with high sugar content. Foods high in fiber. The items listed above may not be a complete list of foods and beverages to avoid. Contact a dietitian for more information. Summary  A bland diet consists of foods that are often soft and do not have a lot of fat, fiber, or extra seasonings.  Foods without fat, fiber, or seasoning are easier for the body to digest.  Check with your health care provider to see how long you should follow this diet plan. It is not meant to be followed for long periods. This information is not intended to replace advice  given to you by your health care provider. Make sure you discuss any questions you have with your health care provider. Document Revised: 05/19/2017 Document Reviewed: 05/19/2017 Elsevier Patient Education  2021 [employer].    Instructions sent via [place].   Follow Up Instructions: No follow-ups on file.    I discussed the assessment and treatment plan with the patient. The patient was provided an opportunity to ask questions and all were answered. The patient agreed with the plan and demonstrated an understanding of the instructions.   The patient was advised to call back or seek an in-person evaluation if any new concerns, if symptoms worsen or if the condition fails to improve as anticipated.  21 minutes of non-face-to-face time was provided during this encounter.      . . . . . . . . . . . . . Marland Kitchen  Historical information moved to improve visibility of documentation.  Past Medical History:  Diagnosis Date  . Allergy   . Anemia   . Back sprain/strain, thoracic   . Depression   . Diverticulosis   . Gastric ulcer   . Hearing loss   . Hemorrhoids   . Hyperlipidemia   . Hypertension   . IBS (irritable bowel syndrome)   . Iron deficiency anemia due to chronic blood loss 11/03/2019  . Lumbar sprain and strain   . Migraines   . Nevus    atypical  . Obesity   . Osteopenia   . Panic attacks    Past Surgical History:  Procedure Laterality Date  . cataract surgery  2004   bilateral   Social History   Tobacco Use  . Smoking status: Former Smoker    Quit date: 05/05/1995    Years since quitting: 25.4  . Smokeless tobacco: Never Used  Substance Use Topics  . Alcohol use: Yes    Alcohol/week: 0.5 standard drinks    Types: 1 Standard drinks or equivalent per week    Comment: occasionally   family history includes Depression in her mother; Diabetes in her father; Hypertension in her brother and father; Stroke in her  father.  Medications: Current Outpatient Medications  Medication Sig Dispense Refill  . Bacillus Coagulans-Inulin (PROBIOTIC) 1-250 BILLION-MG CAPS Take 1 capsule by mouth daily.    . carisoprodol (SOMA) 350 MG tablet TAKE ONE TABLET BY MOUTH DAILY AS NEEDED FOR MUSCLE SPASMS 90 tablet 0  . clonazePAM (KLONOPIN) 0.5 MG tablet Take 0.5-1 tablets (0.25-0.5 mg total) by mouth daily as needed for anxiety. 90 tablet 0  . enalapril (VASOTEC) 5 MG tablet Take 5 mg by mouth.    . hydrOXYzine (ATARAX/VISTARIL) 10 MG tablet TAKE 1 TABLET BY MOUTH  TWICE DAILY AS NEEDED FOR  ANXIETY 180 tablet 1  . loperamide (IMODIUM A-D) 2 MG tablet Take 2 mg by mouth as needed.      . melatonin 5 MG TABS Take 5 mg by mouth at bedtime. As needed.    . metoprolol tartrate (LOPRESSOR) 25 MG tablet Take 1 tablet (25 mg total) by mouth 2 (two) times daily. 180 tablet 1  . nystatin cream (MYCOSTATIN) APPLY TO AFFECTED AREA(S)  TOPICALLY TWICE DAILY 90 g 2  . Omega-3 Fatty Acids (OMEGA 3 PO) Take 1 capsule by mouth daily.    . ondansetron (ZOFRAN-ODT) 8 MG disintegrating tablet Take 1 tablet (8 mg total) by mouth every 8 (eight) hours as needed for nausea or vomiting. 20 tablet 1  . pantoprazole (PROTONIX) 40 MG tablet TAKE 1 TABLET BY MOUTH  DAILY 90 tablet 3  . pravastatin (PRAVACHOL) 20 MG tablet Take 1 tablet (20 mg total) by mouth every 3 (three) days. At bedtime 30 tablet 0  . promethazine (PHENERGAN) 25 MG tablet TAKE ONE TABLET BY MOUTH EVERY SIX HOURS AS NEEDED FOR NAUSEA 30 tablet 3  . sertraline (ZOLOFT) 100 MG tablet Take 1.5 tablets (150 mg total) by mouth daily. 135 tablet 3  . traZODone (DESYREL) 100 MG tablet Take 100 mg by mouth at bedtime.  0  . triamcinolone cream (KENALOG) 0.5 % APPLY 1 APPLICATION  TOPICALLY 2 (TWO) TIMES  DAILY. TO AFFECTED AREAS. 90 g 1  . Turmeric 500 MG CAPS Take 1 capsule by mouth daily.    . Cholecalciferol (VITAMIN D3 PO) Take 1 tablet by mouth daily. (Patient not taking: Reported  on 09/25/2020)    .  Coenzyme Q10 (COQ10) 100 MG CAPS Take 300 mg by mouth daily. (Patient not taking: Reported on 09/25/2020)     No current facility-administered medications for this visit.   Allergies  Allergen Reactions  . Aripiprazole Other (See Comments)    REACTION: nightmares.  . Nsaids Other (See Comments)    Bleeding ulcer  . Skelaxin [Metaxalone] Other (See Comments)    Cough, SOB  . Tolmetin Other (See Comments)    Bleeding ulcer  . Cephalexin Nausea And Vomiting  . Lipitor [Atorvastatin] Other (See Comments)    Muscle pain  . Simvastatin Other (See Comments)    Muscle aches/   . Sulfa Antibiotics Nausea Only  . Sulfonamide Derivatives Nausea Only  . Tizanidine Anxiety, Diarrhea, Nausea Only and Photosensitivity  . Zanaflex [Tizanidine Hcl] Nausea Only and Other (See Comments)    Dizziness,night sweats, confusion     If phone visit, billing and coding can please add appropriate modifier if needed

## 2020-09-25 NOTE — Patient Instructions (Signed)
Bland Diet A bland diet consists of foods that are often soft and do not have a lot of fat, fiber, or extra seasonings. Foods without fat, fiber, or seasoning are easier for the body to digest. They are also less likely to irritate your mouth, throat, stomach, and other parts of your digestive system. A bland diet is sometimes called a BRAT diet. What is my plan? Your health care provider or food and nutrition specialist (dietitian) may recommend specific changes to your diet to prevent symptoms or to treat your symptoms. These changes may include:  Eating small meals often.  Cooking food until it is soft enough to chew easily.  Chewing your food well.  Drinking fluids slowly.  Not eating foods that are very spicy, sour, or fatty.  Not eating citrus fruits, such as oranges and grapefruit. What do I need to know about this diet?  Eat a variety of foods from the bland diet food list.  Do not follow a bland diet longer than needed.  Ask your health care provider whether you should take vitamins or supplements. What foods can I eat? Grains Hot cereals, such as cream of wheat. Rice. Bread, crackers, or tortillas made from refined white flour.   Vegetables Canned or cooked vegetables. Mashed or boiled potatoes. Fruits Bananas. Applesauce. Other types of cooked or canned fruit with the skin and seeds removed, such as canned peaches or pears.   Meats and other proteins Scrambled eggs. Creamy peanut butter or other nut butters. Lean, well-cooked meats, such as chicken or fish. Tofu. Soups or broths.   Dairy Low-fat dairy products, such as milk, cottage cheese, or yogurt. Beverages Water. Herbal tea. Apple juice.   Fats and oils Mild salad dressings. Canola or olive oil. Sweets and desserts Pudding. Custard. Fruit gelatin. Ice cream. The items listed above may not be a complete list of recommended foods and beverages. Contact a dietitian for more options. What foods are not  recommended? Grains Whole grain breads and cereals. Vegetables Raw vegetables. Fruits Raw fruits, especially citrus, berries, or dried fruits. Dairy Whole fat dairy foods. Beverages Caffeinated drinks. Alcohol. Seasonings and condiments Strongly flavored seasonings or condiments. Hot sauce. Salsa. Other foods Spicy foods. Fried foods. Sour foods, such as pickled or fermented foods. Foods with high sugar content. Foods high in fiber. The items listed above may not be a complete list of foods and beverages to avoid. Contact a dietitian for more information. Summary  A bland diet consists of foods that are often soft and do not have a lot of fat, fiber, or extra seasonings.  Foods without fat, fiber, or seasoning are easier for the body to digest.  Check with your health care provider to see how long you should follow this diet plan. It is not meant to be followed for long periods. This information is not intended to replace advice given to you by your health care provider. Make sure you discuss any questions you have with your health care provider. Document Revised: 05/19/2017 Document Reviewed: 05/19/2017 Elsevier Patient Education  2021 Elsevier Inc.  

## 2020-10-09 ENCOUNTER — Telehealth: Payer: Self-pay | Admitting: Family Medicine

## 2020-10-09 NOTE — Progress Notes (Signed)
  Chronic Care Management   Outreach Note  10/09/2020 Name: Dawn Bailey MRN: [id number] DOB: [birth year]  Referred by: Hali Marry, MD Reason for referral : No chief complaint on file.   A second unsuccessful telephone outreach was attempted today. The patient was referred to pharmacist for assistance with care management and care coordination.  Follow Up Plan:   Lauretta Grill [facility] Scheduler

## 2020-10-15 ENCOUNTER — Telehealth: Payer: Self-pay

## 2020-10-15 NOTE — Telephone Encounter (Signed)
Pt called in to cancel her 11/15/20 appt and states that she will c/b to r/s at a later time  Dawn Bailey

## 2020-10-17 ENCOUNTER — Other Ambulatory Visit: Payer: Self-pay | Admitting: Family Medicine

## 2020-10-29 ENCOUNTER — Telehealth: Payer: Self-pay | Admitting: Family Medicine

## 2020-10-29 NOTE — Progress Notes (Signed)
  Chronic Care Management   Outreach Note  10/29/2020 Name: ANDRENA MARGERUM MRN: [id number] DOB: 1934-10-10  Referred by: Hali Marry, MD Reason for referral : No chief complaint on file.   Third unsuccessful telephone outreach was attempted today. The patient was referred to the pharmacist for assistance with care management and care coordination.   Follow Up Plan:   Lauretta Grill Up[facility] Scheduler

## 2020-11-15 ENCOUNTER — Ambulatory Visit: Payer: Medicare Other | Admitting: Family

## 2020-11-15 ENCOUNTER — Other Ambulatory Visit: Payer: Medicare Other

## 2020-12-09 ENCOUNTER — Other Ambulatory Visit: Payer: Self-pay

## 2020-12-09 ENCOUNTER — Other Ambulatory Visit: Payer: Self-pay | Admitting: Family Medicine

## 2020-12-09 MED ORDER — CARISOPRODOL 350 MG PO TABS: ORAL_TABLET | ORAL | 0 refills | Status: DC

## 2020-12-19 ENCOUNTER — Other Ambulatory Visit: Payer: Self-pay | Admitting: Family Medicine

## 2020-12-20 NOTE — Telephone Encounter (Signed)
Please call patient and let her know that we received a request for trazodone for sleep.  Have never written for this for her.  I was not sure somebody else was writing this and she is wanting me to take over it.  But she is also on a very high dose to be mixing with her other medicines particularly the somewhat on the clonazepam.  But I wanted to ask about the medication.  I am willing to prescribe it but not at 300 mg which is quite high.

## 2020-12-20 NOTE — Telephone Encounter (Signed)
Phone call made, rang with no voicemail to leave message. Will try again

## 2020-12-24 ENCOUNTER — Telehealth: Payer: Self-pay

## 2020-12-24 DIAGNOSIS — F5101 Primary insomnia: Secondary | ICD-10-CM

## 2020-12-24 NOTE — Telephone Encounter (Signed)
[place] (1st attempt)    Pt called wanting to know the status of the trazodone refill. Please see 12/19/20 refill encounter in chart

## 2020-12-25 MED ORDER — TRAZODONE HCL 100 MG PO TABS: 100.0000 mg | ORAL_TABLET | Freq: Every day | ORAL | 1 refills | Status: DC

## 2020-12-25 NOTE — Telephone Encounter (Signed)
Meds ordered this encounter  Medications   traZODone (DESYREL) 100 MG tablet    Sig: Take 1 tablet (100 mg total) by mouth at bedtime.    Dispense:  90 tablet    Refill:  1

## 2020-12-25 NOTE — Telephone Encounter (Signed)
Hali Marry, MD     10:25 AM Note Please call patient and let her know that we received a request for trazodone for sleep.  Have never written for this for her.  I was not sure somebody else was writing this and she is wanting me to take over it.  But she is also on a very high dose to be mixing with her other medicines particularly the somewhat on the clonazepam.  But I wanted to ask about the medication.  I am willing to prescribe it but not at 300 mg which is quite high.      Spoke with pt who states that she only takes '100mg'$  qhs.  Pt would like this RX sent to [facility] in [place] if approved.  Charyl Bigger, CMA

## 2020-12-27 DIAGNOSIS — D61818 Other pancytopenia: Secondary | ICD-10-CM | POA: Diagnosis not present

## 2020-12-27 DIAGNOSIS — D649 Anemia, unspecified: Secondary | ICD-10-CM | POA: Diagnosis not present

## 2020-12-31 ENCOUNTER — Telehealth: Payer: Self-pay | Admitting: Lab

## 2020-12-31 DIAGNOSIS — D696 Thrombocytopenia, unspecified: Secondary | ICD-10-CM | POA: Diagnosis not present

## 2020-12-31 DIAGNOSIS — D61818 Other pancytopenia: Secondary | ICD-10-CM | POA: Diagnosis not present

## 2020-12-31 NOTE — Chronic Care Management (AMB) (Signed)
  Chronic Care Management   Outreach Note  12/31/2020 Name: Dawn Bailey MRN: [id number] DOB: Nov 17, 1934  Referred by: Hali Marry, MD Reason for referral : Medication Management   An unsuccessful telephone outreach was attempted today. The patient was referred to the pharmacist for assistance with care management and care coordination.   Follow Up Plan:   [place]

## 2021-01-01 NOTE — Telephone Encounter (Signed)
Pt stated that a previous provider (Dr. Marjory Sneddon) was writing this for her. And she stated that she had asked Dr. Madilyn Fireman if she would take the prescription over. She stated that the original prescription was for her to take up to 150 mg at bedtime not 300.    She is ONLY taking 100 mg. It was told to her that she could take between 100-150 mg depending on the day.   She did p/u the prescription that was sent for her on Friday.   Pt also noted that she had a bone bx done today to find out why her platelet count is so low and that Dr. Madilyn Fireman should get a copy of these results.

## 2021-01-02 ENCOUNTER — Other Ambulatory Visit: Payer: Self-pay | Admitting: Family Medicine

## 2021-01-08 ENCOUNTER — Telehealth: Payer: Self-pay | Admitting: Lab

## 2021-01-08 NOTE — Progress Notes (Signed)
  Chronic Care Management   Outreach Note  01/08/2021 Name: Dawn Bailey MRN: [id number] DOB: 1934-06-03  Referred by: Hali Marry, MD Reason for referral : Medication Management   A second unsuccessful telephone outreach was attempted today. The patient was referred to pharmacist for assistance with care management and care coordination.  Follow Up Plan:   [place]

## 2021-01-20 ENCOUNTER — Telehealth: Payer: Self-pay | Admitting: Lab

## 2021-01-20 NOTE — Chronic Care Management (AMB) (Signed)
  Chronic Care Management   Note  01/20/2021 Name: Dawn Bailey MRN: [id number] DOB: [birth year]  Dawn Bailey is a 86 y.o. year old female who is a primary care patient of Metheney, Rene Kocher, MD. I reached out to [name] by phone today in response to a referral sent by Dawn Bailey's PCP, Hali Marry, MD.   Dawn Bailey was given information about Chronic Care Management services today including:  CCM service includes personalized support from designated clinical staff supervised by her physician, including individualized plan of care and coordination with other care providers 24/7 contact phone numbers for assistance for urgent and routine care needs. Service will only be billed when office clinical staff spend 20 minutes or more in a month to coordinate care. Only one practitioner may furnish and bill the service in a calendar month. The patient may stop CCM services at any time (effective at the end of the month) by phone call to the office staff.   Patient agreed to services and verbal consent obtained.   Follow up plan:  [place]

## 2021-01-28 ENCOUNTER — Ambulatory Visit: Payer: Medicare Other | Admitting: Family Medicine

## 2021-01-28 NOTE — Progress Notes (Deleted)
Established Patient Office Visit  Subjective:  Patient ID: Dawn Bailey, female    DOB: 1934-10-19  Age: 86 y.o. MRN: [id number]  CC: No chief complaint on file.   HPI Dawn Bailey presents for wrist pain.   F/U MDD -   F/U insomnia -    Past Medical History:  Diagnosis Date   Allergy    Anemia    Back sprain/strain, thoracic    Depression    Diverticulosis    Gastric ulcer    Hearing loss    Hemorrhoids    Hyperlipidemia    Hypertension    IBS (irritable bowel syndrome)    Iron deficiency anemia due to chronic blood loss 11/03/2019   Lumbar sprain and strain    Migraines    Nevus    atypical   Obesity    Osteopenia    Panic attacks     Past Surgical History:  Procedure Laterality Date   cataract surgery  2004   bilateral    Family History  Problem Relation Age of Onset   Depression Mother    Diabetes Father    Hypertension Father    Stroke Father    Hypertension Brother     Social History   Socioeconomic History   Marital status: Widowed    Spouse name: Not on file   Number of children: 2   Years of education: 15   Highest education level: Associate degree: academic program  Occupational History   Occupation: Press photographer    Comment: retired  Tobacco Use   Smoking status: Former    Types: Cigarettes    Quit date: 05/05/1995    Years since quitting: 25.7   Smokeless tobacco: Never  Vaping Use   Vaping Use: Never used  Substance and Sexual Activity   Alcohol use: Yes    Alcohol/week: 0.5 standard drinks    Types: 1 Standard drinks or equivalent per week    Comment: occasionally   Drug use: No    Comment: Caffiene: 1 cup of tea   Sexual activity: Not Currently    Comment: married, 2 children, LMP over 10 yrs ago, walks everyday, daily caffeine.  Other Topics Concern   Not on file  Social History Narrative   Lives alone. Has two children. Likes to do crossword puzzles, jigsaw puzzles and read. She has a dog that she likes to take care of.    Social Determinants of Health   Financial Resource Strain: Low Risk    Difficulty of Paying Living Expenses: Not hard at all  Food Insecurity: No Food Insecurity   Worried About Charity fundraiser in the Last Year: Never true   [place] in the Last Year: Never true  Transportation Needs: No Transportation Needs   Lack of Transportation (Medical): No   Lack of Transportation (Non-Medical): No  Physical Activity: Inactive   Days of Exercise per Week: 0 days   Minutes of Exercise per Session: 0 min  Stress: No Stress Concern Present   Feeling of Stress : Not at all  Social Connections: Socially Isolated   Frequency of Communication with Friends and Family: More than three times a week   Frequency of Social Gatherings with Friends and Family: Once a week   Attends Religious Services: Never   Marine scientist or Organizations: No   Attends Archivist Meetings: Never   Marital Status: Widowed  Intimate Partner Violence: Not At Risk   Fear of  Current or Ex-Partner: No   Emotionally Abused: No   Physically Abused: No   Sexually Abused: No    Outpatient Medications Prior to Visit  Medication Sig Dispense Refill   Bacillus Coagulans-Inulin (PROBIOTIC) 1-250 BILLION-MG CAPS Take 1 capsule by mouth daily.     carisoprodol (SOMA) 350 MG tablet Take one tablet as needed daily for chronic low back pain. 90 tablet 0   Cholecalciferol (VITAMIN D3 PO) Take 1 tablet by mouth daily. (Patient not taking: Reported on 09/25/2020)     clonazePAM (KLONOPIN) 0.5 MG tablet TAKE 1/2 TO 1 TABLET BY  MOUTH DAILY AS NEEDED FOR  ANXIETY 90 tablet 0   enalapril (VASOTEC) 5 MG tablet TAKE 1 TABLET BY MOUTH  DAILY 90 tablet 3   hydrOXYzine (ATARAX/VISTARIL) 10 MG tablet TAKE 1 TABLET BY MOUTH  TWICE DAILY AS NEEDED FOR  ANXIETY 180 tablet 1   loperamide (IMODIUM A-D) 2 MG tablet Take 2 mg by mouth as needed.       melatonin 5 MG TABS Take 5 mg by mouth at bedtime. As needed.      metoprolol tartrate (LOPRESSOR) 25 MG tablet Take 1 tablet (25 mg total) by mouth 2 (two) times daily. 180 tablet 1   nystatin cream (MYCOSTATIN) APPLY TO AFFECTED AREA(S)  TOPICALLY TWICE DAILY 90 g 2   Omega-3 Fatty Acids (OMEGA 3 PO) Take 1 capsule by mouth daily.     ondansetron (ZOFRAN-ODT) 8 MG disintegrating tablet Take 1 tablet (8 mg total) by mouth every 8 (eight) hours as needed for nausea or vomiting. 20 tablet 1   pantoprazole (PROTONIX) 40 MG tablet TAKE 1 TABLET BY MOUTH  DAILY 90 tablet 3   pravastatin (PRAVACHOL) 20 MG tablet Take 1 tablet (20 mg total) by mouth every 3 (three) days. At bedtime 30 tablet 0   promethazine (PHENERGAN) 25 MG tablet TAKE ONE TABLET BY MOUTH EVERY SIX HOURS AS NEEDED FOR NAUSEA 30 tablet 3   sertraline (ZOLOFT) 100 MG tablet Take 1.5 tablets (150 mg total) by mouth daily. 135 tablet 3   traZODone (DESYREL) 100 MG tablet Take 1 tablet (100 mg total) by mouth at bedtime. 90 tablet 1   triamcinolone cream (KENALOG) 0.5 % APPLY 1 APPLICATION  TOPICALLY 2 (TWO) TIMES  DAILY. TO AFFECTED AREAS. 90 g 1   Turmeric 500 MG CAPS Take 1 capsule by mouth daily.     No facility-administered medications prior to visit.    Allergies  Allergen Reactions   Aripiprazole Other (See Comments)    REACTION: nightmares.   Nsaids Other (See Comments)    Bleeding ulcer   Skelaxin [Metaxalone] Other (See Comments)    Cough, SOB   Tolmetin Other (See Comments)    Bleeding ulcer   Cephalexin Nausea And Vomiting   Lipitor [Atorvastatin] Other (See Comments)    Muscle pain   Simvastatin Other (See Comments)    Muscle aches/    Sulfa Antibiotics Nausea Only   Sulfonamide Derivatives Nausea Only   Tizanidine Anxiety, Diarrhea, Nausea Only and Photosensitivity   Zanaflex [Tizanidine Hcl] Nausea Only and Other (See Comments)    Dizziness,night sweats, confusion    ROS Review of Systems    Objective:    Physical Exam  There were no vitals taken for this  visit. Wt Readings from Last 3 Encounters:  09/25/20 180 lb (81.6 kg)  07/11/20 185 lb (83.9 kg)  03/19/20 185 lb 1.9 oz (84 kg)     Health Maintenance Due  Topic Date Due   Zoster Vaccines- Shingrix (1 of 2) Never done   COVID-19 Vaccine (4 - Booster for Pfizer series) 05/02/2020   INFLUENZA VACCINE  12/02/2020    There are no preventive care reminders to display for this patient.  Lab Results  Component Value Date   TSH 2.58 05/20/2018   Lab Results  Component Value Date   WBC 3.9 (L) 07/11/2020   HGB 11.1 (L) 07/11/2020   HCT 35.2 (L) 07/11/2020   MCV 74.9 (L) 07/11/2020   PLT 71 (L) 07/11/2020   Lab Results  Component Value Date   NA 137 07/11/2020   K 4.0 07/11/2020   CO2 31 07/11/2020   GLUCOSE 93 07/11/2020   BUN 9 07/11/2020   CREATININE 0.70 07/11/2020   BILITOT 0.4 07/11/2020   ALKPHOS 67 07/11/2020   AST 18 07/11/2020   ALT 12 07/11/2020   PROT 7.1 07/11/2020   ALBUMIN 4.7 07/11/2020   CALCIUM 9.7 07/11/2020   ANIONGAP 6 07/11/2020   Lab Results  Component Value Date   CHOL 261 (H) 09/28/2019   Lab Results  Component Value Date   HDL 72 09/28/2019   Lab Results  Component Value Date   LDLCALC 163 (H) 09/28/2019   Lab Results  Component Value Date   TRIG 136 09/28/2019   Lab Results  Component Value Date   CHOLHDL 3.6 09/28/2019   Lab Results  Component Value Date   HGBA1C 5.1 09/28/2019      Assessment & Plan:   Problem List Items Addressed This Visit       Other   Major depressive disorder, recurrent episode, moderate ([place]) - Primary    No orders of the defined types were placed in this encounter.   Follow-up: No follow-ups on file.    [name], MD

## 2021-02-07 DIAGNOSIS — D469 Myelodysplastic syndrome, unspecified: Secondary | ICD-10-CM | POA: Diagnosis not present

## 2021-02-07 DIAGNOSIS — D696 Thrombocytopenia, unspecified: Secondary | ICD-10-CM | POA: Diagnosis not present

## 2021-02-07 DIAGNOSIS — Z23 Encounter for immunization: Secondary | ICD-10-CM | POA: Diagnosis not present

## 2021-02-07 DIAGNOSIS — D509 Iron deficiency anemia, unspecified: Secondary | ICD-10-CM | POA: Diagnosis not present

## 2021-02-10 ENCOUNTER — Other Ambulatory Visit: Payer: Self-pay | Admitting: Family Medicine

## 2021-02-10 ENCOUNTER — Ambulatory Visit (INDEPENDENT_AMBULATORY_CARE_PROVIDER_SITE_OTHER): Payer: Medicare Other | Admitting: Family Medicine

## 2021-02-10 ENCOUNTER — Encounter: Payer: Self-pay | Admitting: Family Medicine

## 2021-02-10 VITALS — BP 137/70 | HR 79 | Ht 66.0 in | Wt 186.0 lb

## 2021-02-10 DIAGNOSIS — G8929 Other chronic pain: Secondary | ICD-10-CM | POA: Diagnosis not present

## 2021-02-10 DIAGNOSIS — D696 Thrombocytopenia, unspecified: Secondary | ICD-10-CM | POA: Insufficient documentation

## 2021-02-10 DIAGNOSIS — F331 Major depressive disorder, recurrent, moderate: Secondary | ICD-10-CM

## 2021-02-10 DIAGNOSIS — G43009 Migraine without aura, not intractable, without status migrainosus: Secondary | ICD-10-CM | POA: Diagnosis not present

## 2021-02-10 DIAGNOSIS — I10 Essential (primary) hypertension: Secondary | ICD-10-CM | POA: Diagnosis not present

## 2021-02-10 DIAGNOSIS — M545 Low back pain, unspecified: Secondary | ICD-10-CM

## 2021-02-10 DIAGNOSIS — F5101 Primary insomnia: Secondary | ICD-10-CM | POA: Diagnosis not present

## 2021-02-10 MED ORDER — AMBULATORY NON FORMULARY MEDICATION: 0 refills | Status: DC

## 2021-02-10 MED ORDER — CLONAZEPAM 0.5 MG PO TABS: ORAL_TABLET | ORAL | 0 refills | Status: DC

## 2021-02-10 MED ORDER — CARISOPRODOL 350 MG PO TABS: ORAL_TABLET | ORAL | 0 refills | Status: DC

## 2021-02-10 MED ORDER — CARISOPRODOL 350 MG PO TABS: 350.0000 mg | ORAL_TABLET | Freq: Every day | ORAL | 0 refills | Status: DC

## 2021-02-10 MED ORDER — TRAZODONE HCL 100 MG PO TABS: 150.0000 mg | ORAL_TABLET | Freq: Every day | ORAL | 1 refills | Status: DC

## 2021-02-10 MED ORDER — TIZANIDINE HCL 4 MG PO TABS: 4.0000 mg | ORAL_TABLET | Freq: Every day | ORAL | 0 refills | Status: DC | PRN

## 2021-02-10 NOTE — Addendum Note (Signed)
Addended by: [name] D on: 02/10/2021 01:36 PM   Modules accepted: Orders

## 2021-02-10 NOTE — Progress Notes (Addendum)
Established Patient Office Visit  Subjective:  Patient ID: Dawn Bailey, female    DOB: July 03, 1934  Age: 86 y.o. MRN: [id number]  CC:  Chief Complaint  Patient presents with   Wrist Pain    HPI Dawn Bailey presents for   Hypertension- Pt denies chest pain, SOB, dizziness, or heart palpitations.  Taking meds as directed w/o problems.  Denies medication side effects.    F/U migraines -she says she takes the Soma every night and then takes usually half a tab during the day for her back pain she says because of her arthritis usually gets up she is able to do things in the morning but usually by the afternoon she has not of pain in her back and her neck that she will usually take about a half a tab the Soma to get relief.  She then takes a whole tab at bedtime.  Seeing hematology oncology for thrombocytopenia.  In fact she has pancytopenia.  He had recommend proceeding with a bone marrow biopsy to rule out MDS.  She recently started some extra vitamin C and feels like that is actually helped her feel a bit little better.  She has a cyst near her left wrist near the base of her thumb she would like me to look at.  She says sometimes it feels very sore and bothers her and then other times its not bothersome at all.  Right now its not a problem.  She wonders if it could be a ganglion cyst.  She has had 1 before in a different location.  She does have a large bruise on her right shin that she thinks was from a spider bite it seems to be healing but she did want me to look at it today.  Past Medical History:  Diagnosis Date   Allergy    Anemia    Back sprain/strain, thoracic    Depression    Diverticulosis    Gastric ulcer    Hearing loss    Hemorrhoids    Hyperlipidemia    Hypertension    IBS (irritable bowel syndrome)    Iron deficiency anemia due to chronic blood loss 11/03/2019   Lumbar sprain and strain    Migraines    Nevus    atypical   Obesity    Osteopenia    Panic  attacks     Past Surgical History:  Procedure Laterality Date   cataract surgery  2004   bilateral    Family History  Problem Relation Age of Onset   Depression Mother    Diabetes Father    Hypertension Father    Stroke Father    Hypertension Brother     Social History   Socioeconomic History   Marital status: Widowed    Spouse name: Not on file   Number of children: 2   Years of education: 15   Highest education level: Associate degree: academic program  Occupational History   Occupation: Press photographer    Comment: retired  Tobacco Use   Smoking status: Former    Types: Cigarettes    Quit date: 05/05/1995    Years since quitting: 25.7   Smokeless tobacco: Never  Vaping Use   Vaping Use: Never used  Substance and Sexual Activity   Alcohol use: Yes    Alcohol/week: 0.5 standard drinks    Types: 1 Standard drinks or equivalent per week    Comment: occasionally   Drug use: No    Comment: Caffiene:  1 cup of tea   Sexual activity: Not Currently    Comment: married, 2 children, LMP over 10 yrs ago, walks everyday, daily caffeine.  Other Topics Concern   Not on file  Social History Narrative   Lives alone. Has two children. Likes to do crossword puzzles, jigsaw puzzles and read. She has a dog that she likes to take care of.   Social Determinants of Health   Financial Resource Strain: Low Risk    Difficulty of Paying Living Expenses: Not hard at all  Food Insecurity: No Food Insecurity   Worried About Charity fundraiser in the Last Year: Never true   University in the Last Year: Never true  Transportation Needs: No Transportation Needs   Lack of Transportation (Medical): No   Lack of Transportation (Non-Medical): No  Physical Activity: Inactive   Days of Exercise per Week: 0 days   Minutes of Exercise per Session: 0 min  Stress: No Stress Concern Present   Feeling of Stress : Not at all  Social Connections: Socially Isolated   Frequency of Communication with  Friends and Family: More than three times a week   Frequency of Social Gatherings with Friends and Family: Once a week   Attends Religious Services: Never   Marine scientist or Organizations: No   Attends Archivist Meetings: Never   Marital Status: Widowed  Human resources officer Violence: Not At Risk   Fear of Current or Ex-Partner: No   Emotionally Abused: No   Physically Abused: No   Sexually Abused: No    Outpatient Medications Prior to Visit  Medication Sig Dispense Refill   Bacillus Coagulans-Inulin (PROBIOTIC) 1-250 BILLION-MG CAPS Take 1 capsule by mouth daily.     Cholecalciferol (VITAMIN D3 PO) Take 1 tablet by mouth daily.     enalapril (VASOTEC) 5 MG tablet TAKE 1 TABLET BY MOUTH  DAILY 90 tablet 3   hydrOXYzine (ATARAX/VISTARIL) 10 MG tablet TAKE 1 TABLET BY MOUTH  TWICE DAILY AS NEEDED FOR  ANXIETY 180 tablet 1   loperamide (IMODIUM A-D) 2 MG tablet Take 2 mg by mouth as needed.       melatonin 5 MG TABS Take 5 mg by mouth at bedtime. As needed.     nystatin cream (MYCOSTATIN) APPLY TO AFFECTED AREA(S)  TOPICALLY TWICE DAILY 90 g 2   Omega-3 Fatty Acids (OMEGA 3 PO) Take 1 capsule by mouth daily.     ondansetron (ZOFRAN-ODT) 8 MG disintegrating tablet Take 1 tablet (8 mg total) by mouth every 8 (eight) hours as needed for nausea or vomiting. 20 tablet 1   pantoprazole (PROTONIX) 40 MG tablet TAKE 1 TABLET BY MOUTH  DAILY 90 tablet 3   promethazine (PHENERGAN) 25 MG tablet TAKE ONE TABLET BY MOUTH EVERY SIX HOURS AS NEEDED FOR NAUSEA 30 tablet 3   sertraline (ZOLOFT) 100 MG tablet Take 1.5 tablets (150 mg total) by mouth daily. 135 tablet 3   triamcinolone cream (KENALOG) 0.5 % APPLY 1 APPLICATION  TOPICALLY 2 (TWO) TIMES  DAILY. TO AFFECTED AREAS. 90 g 1   Turmeric 500 MG CAPS Take 1 capsule by mouth daily.     carisoprodol (SOMA) 350 MG tablet Take one tablet as needed daily for chronic low back pain. 90 tablet 0   clonazePAM (KLONOPIN) 0.5 MG tablet TAKE 1/2  TO 1 TABLET BY  MOUTH DAILY AS NEEDED FOR  ANXIETY 90 tablet 0   traZODone (DESYREL) 100 MG tablet  Take 1 tablet (100 mg total) by mouth at bedtime. 90 tablet 1   metoprolol tartrate (LOPRESSOR) 25 MG tablet Take 1 tablet (25 mg total) by mouth 2 (two) times daily. 180 tablet 1   pravastatin (PRAVACHOL) 20 MG tablet Take 1 tablet (20 mg total) by mouth every 3 (three) days. At bedtime 30 tablet 0   No facility-administered medications prior to visit.    Allergies  Allergen Reactions   Aripiprazole Other (See Comments)    REACTION: nightmares.   Nsaids Other (See Comments)    Bleeding ulcer   Skelaxin [Metaxalone] Other (See Comments)    Cough, SOB   Tolmetin Other (See Comments)    Bleeding ulcer   Cephalexin Nausea And Vomiting   Lipitor [Atorvastatin] Other (See Comments)    Muscle pain   Simvastatin Other (See Comments)    Muscle aches/    Sulfa Antibiotics Nausea Only   Sulfonamide Derivatives Nausea Only   Tizanidine Anxiety, Diarrhea, Nausea Only and Photosensitivity   Zanaflex [Tizanidine Hcl] Nausea Only and Other (See Comments)    Dizziness,night sweats, confusion    ROS Review of Systems    Objective:    Physical Exam Constitutional:      Appearance: Normal appearance. She is well-developed.  HENT:     Head: Normocephalic and atraumatic.  Cardiovascular:     Rate and Rhythm: Normal rate and regular rhythm.     Heart sounds: Normal heart sounds.  Pulmonary:     Effort: Pulmonary effort is normal.     Breath sounds: Normal breath sounds.  Skin:    General: Skin is warm and dry.  Neurological:     Mental Status: She is alert and oriented to person, place, and time.  Psychiatric:        Behavior: Behavior normal.    BP 137/70   Pulse 79   Ht $R'5\' 6"'Wd$  (1.676 m)   Wt 186 lb (84.4 kg)   SpO2 99%   BMI 30.02 kg/m  Wt Readings from Last 3 Encounters:  02/10/21 186 lb (84.4 kg)  09/25/20 180 lb (81.6 kg)  07/11/20 185 lb (83.9 kg)     Health  Maintenance Due  Topic Date Due   Zoster Vaccines- Shingrix (1 of 2) Never done   COVID-19 Vaccine (4 - Booster for Pf[employer] series) 05/02/2020    There are no preventive care reminders to display for this patient.  Lab Results  Component Value Date   TSH 2.58 05/20/2018   Lab Results  Component Value Date   WBC 3.9 (L) 07/11/2020   HGB 11.1 (L) 07/11/2020   HCT 35.2 (L) 07/11/2020   MCV 74.9 (L) 07/11/2020   PLT 71 (L) 07/11/2020   Lab Results  Component Value Date   NA 137 07/11/2020   K 4.0 07/11/2020   CO2 31 07/11/2020   GLUCOSE 93 07/11/2020   BUN 9 07/11/2020   CREATININE 0.70 07/11/2020   BILITOT 0.4 07/11/2020   ALKPHOS 67 07/11/2020   AST 18 07/11/2020   ALT 12 07/11/2020   PROT 7.1 07/11/2020   ALBUMIN 4.7 07/11/2020   CALCIUM 9.7 07/11/2020   ANIONGAP 6 07/11/2020   Lab Results  Component Value Date   CHOL 261 (H) 09/28/2019   Lab Results  Component Value Date   HDL 72 09/28/2019   Lab Results  Component Value Date   LDLCALC 163 (H) 09/28/2019   Lab Results  Component Value Date   TRIG 136 09/28/2019   Lab Results  Component Value Date   CHOLHDL 3.6 09/28/2019   Lab Results  Component Value Date   HGBA1C 5.1 09/28/2019      Assessment & Plan:   Problem List Items Addressed This Visit       Cardiovascular and Mediastinum   Migraine (Chronic)    Takes the Soma every night and usually half a tab during the day for her migraines and for chronic back and neck pain.  We have discussed it previously and talked about switching her to something less harmful based on her age and would really like to start tapering her off the [facility] and then eventually off the benzodiazepine.  We discussed stopping using the Soma at all during the day and just keeping it at nighttime for now and then when I see her back in a few months then we will work on tapering off the evening dose in the short-term and getting give her a different muscle relaxer to try in the  afternoons if needed.  If she is not able to get relief by just sitting and resting and using her heating pad.      Relevant Medications   traZODone (DESYREL) 100 MG tablet   tiZANidine (ZANAFLEX) 4 MG tablet   carisoprodol (SOMA) 350 MG tablet   clonazePAM (KLONOPIN) 0.5 MG tablet   HYPERTENSION, BENIGN SYSTEMIC - Primary (Chronic)    Well controlled. Continue current regimen. Follow up in  6 mo       Relevant Orders   Lipid Panel w/reflex Direct LDL   COMPLETE METABOLIC PANEL WITH GFR     Hematopoietic and Hemostatic   Thrombocytopenia ([place])    Now being followed by hematology.        Other   Major depressive disorder, recurrent episode, moderate (HCC)   Relevant Medications   traZODone (DESYREL) 100 MG tablet   Low back pain    We will try discontinuing Soma during the day and we will try tizanidine instead.  Eventually would like to get rid of the Soma at night as well.      Relevant Medications   tiZANidine (ZANAFLEX) 4 MG tablet   carisoprodol (SOMA) 350 MG tablet   Insomnia    Think she is always also somewhat dependent on the Soma to help her sleep in addition to taking the trazodone.  We will go ahead and refill the trazodone and refill the Soma but I want her to really work on decreasing how often she is using the Soma to only once a day and they are working to try to taper that off completely its, take a little bit of work because her body is used to multiple sedative medications to help her sleep.  We discussed that this does increase her risk for falls and for more long-term side effects      Relevant Medications   traZODone (DESYREL) 100 MG tablet    We discussed decreasing the Soma down to just once a day we discussed that this medication is not recommended for people over 75 and potential risks of continuing the medication.  I would like to try a safer muscle relaxer that she can use in the afternoons when needed.  She does have a nodule on her left wrist  which I do think is a ganglion cyst though it feels incredibly hard but it somewhat mobile.  We discussed that if its not bothering her significantly I would not recommend any further treatment but if it really starts bothering her  and staying persistently irritated or sore and happy to get her in with our sports med doctor to see if it they can confirm the diagnosis and consider aspiration.   She would also like to start walking more around the neighborhood but just does not feel confident to go very far wonders if she might be a candidate for Rollator so that at least she could sit down and rest if she needed to.  A lot of times it is her back that limits her.   Meds ordered this encounter  Medications   traZODone (DESYREL) 100 MG tablet    Sig: Take 1.5 tablets (150 mg total) by mouth at bedtime.    Dispense:  135 tablet    Refill:  1   tiZANidine (ZANAFLEX) 4 MG tablet    Sig: Take 1 tablet (4 mg total) by mouth daily as needed for muscle spasms.    Dispense:  30 tablet    Refill:  0   carisoprodol (SOMA) 350 MG tablet    Sig: Take one tablet as needed daily for chronic low back pain.    Dispense:  90 tablet    Refill:  0   clonazePAM (KLONOPIN) 0.5 MG tablet    Sig: TAKE 1/2 TO 1 TABLET BY  MOUTH DAILY AS NEEDED FOR  ANXIETY    Dispense:  90 tablet    Refill:  0   AMBULATORY NON FORMULARY MEDICATION    Sig: Medication Name: Rolator with seat. Dx: Chronic back pain    Dispense:  1 Units    Refill:  0    Follow-up: Return in about 3 months (around 05/13/2021).    [name], MD

## 2021-02-10 NOTE — Assessment & Plan Note (Signed)
Takes the [employer] every night and usually half a tab during the day for her migraines and for chronic back and neck pain.  We have discussed it previously and talked about switching her to something less harmful based on her age and would really like to start tapering her off the [facility] and then eventually off the benzodiazepine.  We discussed stopping using the Soma at all during the day and just keeping it at nighttime for now and then when I see her back in a few months then we will work on tapering off the evening dose in the short-term and getting give her a different muscle relaxer to try in the afternoons if needed.  If she is not able to get relief by just sitting and resting and using her heating pad.

## 2021-02-10 NOTE — Assessment & Plan Note (Signed)
Think she is always also somewhat dependent on the Soma to help her sleep in addition to taking the trazodone.  We will go ahead and refill the trazodone and refill the Soma but I want her to really work on decreasing how often she is using the Soma to only once a day and they are working to try to taper that off completely its, take a little bit of work because her body is used to multiple sedative medications to help her sleep.  We discussed that this does increase her risk for falls and for more long-term side effects

## 2021-02-10 NOTE — Assessment & Plan Note (Signed)
Now being followed by hematology.

## 2021-02-10 NOTE — Assessment & Plan Note (Signed)
Well controlled. Continue current regimen. Follow up in  6 mo  

## 2021-02-10 NOTE — Assessment & Plan Note (Signed)
We will try discontinuing Soma during the day and we will try tizanidine instead.  Eventually would like to get rid of the Soma at night as well.

## 2021-02-12 ENCOUNTER — Encounter: Payer: Self-pay | Admitting: Family Medicine

## 2021-02-12 MED ORDER — DIAZEPAM 2 MG PO TABS: 2.0000 mg | ORAL_TABLET | Freq: Every day | ORAL | 0 refills | Status: DC | PRN

## 2021-02-12 NOTE — Addendum Note (Signed)
Addended by: [name] D on: 02/12/2021 03:22 PM   Modules accepted: Orders

## 2021-03-04 ENCOUNTER — Telehealth: Payer: Self-pay

## 2021-03-04 NOTE — Chronic Care Management (AMB) (Signed)
Chronic Care Management Pharmacy Assistant   Name: Dawn Bailey  MRN: [id number] DOB: 04-Oct-1934  Dawn Bailey is an 86 y.o. year old female who presents for his initial CCM visit with the clinical pharmacist.  Recent office visits:  02/10/21 Hali Marry MD - Seen for hypertension - Labs ordered - Changed Soma to only at night and add tizanidine to replace as the day time supplement - Follow up in 6 months   Recent consult visits:  02/07/21 Jerrye Noble MD - Hematology - Seen for Thrombocytopenia - No medication changes noted - Follow up in 2 months  09/25/20 [place] for gastroenteritis - Referral to Hematology/ Oncology -  Start ondansteron 8  mg - No follow up noted   Hospital visits:  None in previous 6 months  Medications: Outpatient Encounter Medications as of 03/04/2021  Medication Sig   AMBULATORY NON FORMULARY MEDICATION Medication Name: Rolator with seat. Dx: Chronic back pain   Bacillus Coagulans-Inulin (PROBIOTIC) 1-250 BILLION-MG CAPS Take 1 capsule by mouth daily.   carisoprodol (SOMA) 350 MG tablet Take 1 tablet (350 mg total) by mouth at bedtime. for chronic low back pain.   Cholecalciferol (VITAMIN D3 PO) Take 1 tablet by mouth daily.   diazepam (VALIUM) 2 MG tablet Take 1 tablet (2 mg total) by mouth daily as needed for anxiety.   enalapril (VASOTEC) 5 MG tablet TAKE 1 TABLET BY MOUTH  DAILY   hydrOXYzine (ATARAX/VISTARIL) 10 MG tablet TAKE 1 TABLET BY MOUTH  TWICE DAILY AS NEEDED FOR  ANXIETY   loperamide (IMODIUM A-D) 2 MG tablet Take 2 mg by mouth as needed.     melatonin 5 MG TABS Take 5 mg by mouth at bedtime. As needed.   nystatin cream (MYCOSTATIN) APPLY TO AFFECTED AREA(S)  TOPICALLY TWICE DAILY   Omega-3 Fatty Acids (OMEGA 3 PO) Take 1 capsule by mouth daily.   ondansetron (ZOFRAN-ODT) 8 MG disintegrating tablet Take 1 tablet (8 mg total) by mouth every 8 (eight) hours as needed for nausea or vomiting.    pantoprazole (PROTONIX) 40 MG tablet TAKE 1 TABLET BY MOUTH  DAILY   promethazine (PHENERGAN) 25 MG tablet TAKE ONE TABLET BY MOUTH EVERY SIX HOURS AS NEEDED FOR NAUSEA   sertraline (ZOLOFT) 100 MG tablet Take 1.5 tablets (150 mg total) by mouth daily.   tiZANidine (ZANAFLEX) 4 MG tablet Take 1 tablet (4 mg total) by mouth daily as needed for muscle spasms.   traZODone (DESYREL) 100 MG tablet Take 1.5 tablets (150 mg total) by mouth at bedtime.   triamcinolone cream (KENALOG) 0.5 % APPLY 1 APPLICATION  TOPICALLY 2 (TWO) TIMES  DAILY. TO AFFECTED AREAS.   Turmeric 500 MG CAPS Take 1 capsule by mouth daily.   No facility-administered encounter medications on file as of 03/04/2021.    Care Gaps: Zoster Vaccines- Shingrix - [place] done  COVID-19 Vaccine (Dose 4 - Booster for [employer] series) - Overdue since 04/04/2020   Bacillus Coagulans-Inulin (PROBIOTIC) 1-250 BILLION-MG CAPS - Last filled 11/02/19 carisoprodol (SOMA) 350 MG tablet- Last filled 02/10/21 90 DS Cholecalciferol (VITAMIN D3 PO) - Last filled 11/02/19 diazepam (VALIUM) 2 MG tablet- Last filled 02/12/21 30 DS  enalapril (VASOTEC) 5 MG tablet- Last filled 01/03/21 90 DS  hydrOXYzine (ATARAX/VISTARIL) 10 MG tablet- Last filled 05/07/20 90 DS  loperamide (IMODIUM A-D) 2 MG tablet- Last filled 01/13/11  melatonin 5 MG TABS- Last filled 11/02/19  nystatin cream (MYCOSTATIN) -  Last filled 05/07/20 Omega-3 Fatty Acids (OMEGA 3 PO) - Last filled 11/02/19  ondansetron (ZOFRAN-ODT) 8 MG disintegrating tablet- Last filled 12/05/2020 pantoprazole (PROTONIX) 40 MG tablet- Last filled 02/29/20 90 DS  promethazine (PHENERGAN) 25 MG tablet- Last filled 05/10/18 30 DS  sertraline (ZOLOFT) 100 MG tablet- Last filled 01/15/2021 90 DS  tiZANidine (ZANAFLEX) 4 MG tablet- Last filled 02/10/21 30 DS  traZODone (DESYREL) 100 MG tablet- Last filled 02/10/21 90 DS  triamcinolone cream (KENALOG) 0.5 %- Last filled 05/07/20  Turmeric 500 MG CAPS- Last  filled 11/02/19     Star Rating Drugs: enalapril (VASOTEC) 5 MG tablet- Last filled 01/03/21 90 DS     Andee Poles, CMA

## 2021-03-05 ENCOUNTER — Ambulatory Visit (INDEPENDENT_AMBULATORY_CARE_PROVIDER_SITE_OTHER): Payer: Medicare Other | Admitting: Pharmacist

## 2021-03-05 ENCOUNTER — Encounter: Payer: Self-pay | Admitting: Family Medicine

## 2021-03-05 ENCOUNTER — Other Ambulatory Visit: Payer: Self-pay

## 2021-03-05 ENCOUNTER — Other Ambulatory Visit: Payer: Self-pay | Admitting: Family Medicine

## 2021-03-05 DIAGNOSIS — G8929 Other chronic pain: Secondary | ICD-10-CM

## 2021-03-05 DIAGNOSIS — I10 Essential (primary) hypertension: Secondary | ICD-10-CM

## 2021-03-05 MED ORDER — DIAZEPAM 2 MG PO TABS: 3.0000 mg | ORAL_TABLET | Freq: Every day | ORAL | 2 refills | Status: DC | PRN

## 2021-03-05 NOTE — Patient Instructions (Signed)
Visit Information   PATIENT GOALS/PLAN OF CARE:  Care Plan : Medication Management  Updates made by Darius Bump, RPH since 03/05/2021 12:00 AM     Problem: HTN, chronic pain      Long-Range Goal: Disease Progression Prevention   Start Date: 03/05/2021  This Visit's Progress: On track  Priority: High  Note:   Current Barriers:  None at present  Refill for diazepam and soma requested  Pharmacist Clinical Goal(s):  Over the next 180 days, patient will maintain control of chronic conditions as evidenced by medication fill history, lab values, and vital signs  through collaboration with PharmD and provider.   Interventions: 1:1 collaboration with Hali Marry, MD regarding development and update of comprehensive plan of care as evidenced by provider attestation and co-signature Inter-disciplinary care team collaboration (see longitudinal plan of care) Comprehensive medication review performed; medication list updated in electronic medical record  Hypertension:  Controlled; current treatment:enalapril prescribed but not taking due to light-headed and states was SBP 90s;   Current home readings: 120s/60s  Denies hypotensive/hypertensive symptoms  Recommended continue current regimen  Chronic Pain  Controlled; current treatment:soma 350mg  taking at bedtime, diazepam 2mg  taking 1.5 tablets daily ;   Recommended continue current regimen   Patient Goals/Self-Care Activities Over the next 180 days, patient will:  take medications as prescribed  Follow Up Plan: Telephone follow up appointment with care management team member scheduled for:  6-8 months      Consent to CCM Services: Ms. Halls was given information about Chronic Care Management services including:  CCM service includes personalized support from designated clinical staff supervised by her physician, including individualized plan of care and coordination with other care providers 24/7 contact phone numbers for  assistance for urgent and routine care needs. Service will only be billed when office clinical staff spend 20 minutes or more in a month to coordinate care. Only one practitioner may furnish and bill the service in a calendar month. The patient may stop CCM services at any time (effective at the end of the month) by phone call to the office staff. The patient will be responsible for cost sharing (co-pay) of up to 20% of the service fee (after annual deductible is met).  Patient agreed to services and verbal consent obtained.   Patient verbalizes understanding of instructions provided today and agrees to view in [place].   Telephone follow up appointment with care management team member scheduled for: 6-8 months  Darius Bump

## 2021-03-05 NOTE — Progress Notes (Signed)
Chronic Care Management Pharmacy Note  03/05/2021 Name:  Dawn Bailey MRN:  [id number] DOB:  November 20, 1934  Summary: addressed HTN, chronic pain.   Recommendations/Changes made from today's visit: None, however patient mentioned needing refill soon of diazepam and soma. She states she will message Dr. Madilyn Fireman to request since she was using 1.5 tablets (and ran out of diazepam sooner than RX calls for).  Plan: f/u with pharmacist in 6-8 months  Subjective: Dawn Bailey is an 86 y.o. year old female who is a primary patient of Metheney, Rene Kocher, MD.  The CCM team was consulted for assistance with disease management and care coordination needs.    Engaged with patient by telephone for initial visit in response to provider referral for pharmacy case management and/or care coordination services.   Consent to Services:  The patient was given information about Chronic Care Management services, agreed to services, and gave verbal consent prior to initiation of services.  Please see initial visit note for detailed documentation.   Patient Care Team: Hali Marry, MD as PCP - General Darius Bump, [facility] as Pharmacist (Pharmacist)  Recent consult visits:  02/07/21 Jerrye Noble MD - Hematology - Seen for Thrombocytopenia - No medication changes noted - Follow up in 2 months  09/25/20 [place] for gastroenteritis - Referral to Hematology/ Oncology -  Start ondansteron 8  mg - No follow up noted    Hospital visits:  None in previous 6 months  Objective:  Lab Results  Component Value Date   CREATININE 0.70 07/11/2020   CREATININE 0.75 03/19/2020   CREATININE 0.75 01/18/2020    Lab Results  Component Value Date   HGBA1C 5.1 09/28/2019        Component Value Date/Time   CHOL 261 (H) 09/28/2019 0918   TRIG 136 09/28/2019 0918   TRIG 152 03/06/2009 0000   HDL 72 09/28/2019 0918   CHOLHDL 3.6 09/28/2019 0918   VLDL 36 (H)  02/17/2016 0941   LDLCALC 163 (H) 09/28/2019 0918    Hepatic Function Latest Ref Rng & Units 07/11/2020 03/19/2020 01/18/2020  Total Protein 6.5 - 8.1 g/dL 7.1 7.1 6.8  Albumin 3.5 - 5.0 g/dL 4.7 4.5 4.4  AST 15 - 41 U/L $Remo'18 22 24  'iamoJ$ ALT 0 - 44 U/L $Remo'12 16 18  'bcOdn$ Alk Phosphatase 38 - 126 U/L 67 67 68  Total Bilirubin 0.3 - 1.2 mg/dL 0.4 0.4 0.4    Lab Results  Component Value Date/Time   TSH 2.58 05/20/2018 09:04 AM   TSH 1.34 04/08/2017 11:13 AM    CBC Latest Ref Rng & Units 07/11/2020 03/19/2020 01/18/2020  WBC 4.0 - 10.5 K/uL 3.9(L) 2.8(L) 3.4(L)  Hemoglobin 12.0 - 15.0 g/dL 11.1(L) 11.2(L) 10.6(L)  Hematocrit 36.0 - 46.0 % 35.2(L) 36.0 33.4(L)  Platelets 150 - 400 K/uL 71(L) 78(L) 96(L)     Social History   Tobacco Use  Smoking Status Former   Types: Cigarettes   Quit date: 05/05/1995   Years since quitting: 25.8  Smokeless Tobacco Never   BP Readings from Last 3 Encounters:  02/10/21 137/70  09/25/20 139/63  07/11/20 (!) 142/67   Pulse Readings from Last 3 Encounters:  02/10/21 79  07/11/20 71  03/19/20 81   Wt Readings from Last 3 Encounters:  02/10/21 186 lb (84.4 kg)  09/25/20 180 lb (81.6 kg)  07/11/20 185 lb (83.9 kg)    Assessment: Review of patient past medical history,  allergies, medications, health status, including review of consultants reports, laboratory and other test data, was performed as part of comprehensive evaluation and provision of chronic care management services.   SDOH:  (Social Determinants of Health) assessments and interventions performed:    CCM Care Plan  Allergies  Allergen Reactions   Aripiprazole Other (See Comments)    REACTION: nightmares.   Nsaids Other (See Comments)    Bleeding ulcer   Skelaxin [Metaxalone] Other (See Comments)    Cough, SOB   Tolmetin Other (See Comments)    Bleeding ulcer   Cephalexin Nausea And Vomiting   Lipitor [Atorvastatin] Other (See Comments)    Muscle pain   Simvastatin Other (See Comments)     Muscle aches/    Sulfa Antibiotics Nausea Only   Sulfonamide Derivatives Nausea Only   Tizanidine Anxiety, Diarrhea, Nausea Only and Photosensitivity   Zanaflex [Tizanidine Hcl] Nausea Only and Other (See Comments)    Dizziness,night sweats, confusion    Medications Reviewed Today     Reviewed by Darius Bump, [facility] (Pharmacist) on 03/05/21 at 1118  Med List Status: <None>   Medication Order Taking? Sig Documenting Provider Last Dose Status Informant  AMBULATORY NON FORMULARY MEDICATION [id number] Yes Medication Name: Rolator with seat. Dx: Chronic back pain Hali Marry, MD Taking Active   Bacillus Coagulans-Inulin (PROBIOTIC) 1-250 BILLION-MG CAPS [id number] Yes Take 1 capsule by mouth daily. [provider] Taking Active   carisoprodol (SOMA) 350 MG tablet [id number] Yes Take 1 tablet (350 mg total) by mouth at bedtime. for chronic low back pain. Hali Marry, MD Taking Active   Cholecalciferol (VITAMIN D3 PO) [id number] Yes Take 1 tablet by mouth daily. [provider] Taking Active   diazepam (VALIUM) 2 MG tablet [id number] Yes Take 1 tablet (2 mg total) by mouth daily as needed for anxiety. Hali Marry, MD Taking Active   enalapril (VASOTEC) 5 MG tablet [id number] No TAKE 1 TABLET BY MOUTH  DAILY  Patient not taking: Reported on 03/05/2021   Hali Marry, MD Not Taking Active   hydrOXYzine (ATARAX/VISTARIL) 10 MG tablet [id number] No TAKE 1 TABLET BY MOUTH  TWICE DAILY AS NEEDED FOR  ANXIETY  Patient not taking: Reported on 03/05/2021   Hali Marry, MD Not Taking Active   loperamide (IMODIUM A-D) 2 MG tablet [id number] Yes Take 2 mg by mouth as needed.   [provider] Taking Active   melatonin 3 MG TABS tablet [id number] Yes Take 3 mg by mouth at bedtime. As needed. [provider] Taking Active   nystatin cream (MYCOSTATIN) [id number] Yes APPLY TO AFFECTED AREA(S)  TOPICALLY TWICE DAILY Hali Marry, MD Taking Active   Omega-3 Fatty Acids (OMEGA 3 PO) [id number] Yes Take 1 capsule by mouth daily. [provider] Taking Active   ondansetron (ZOFRAN-ODT) 8 MG disintegrating tablet [id number] Yes Take 1 tablet (8 mg total) by mouth every 8 (eight) hours as needed for nausea or vomiting. Emeterio Reeve, DO Taking Active   pantoprazole (PROTONIX) 40 MG tablet [id number] No TAKE 1 TABLET BY MOUTH  DAILY  Patient not taking: Reported on 03/05/2021   Hali Marry, MD Not Taking Active   promethazine (PHENERGAN) 25 MG tablet [id number] Yes TAKE ONE TABLET BY MOUTH EVERY SIX HOURS AS NEEDED FOR NAUSEA Hali Marry, MD Taking Active   sertraline (ZOLOFT) 100 MG tablet [id number] Yes Take 1.5 tablets (150 mg total) by mouth daily. Hali Marry, MD Taking Active  traZODone (DESYREL) 100 MG tablet [id number] Yes Take 1.5 tablets (150 mg total) by mouth at bedtime. Hali Marry, MD Taking Active   triamcinolone cream (KENALOG) 0.5 % [id number] Yes APPLY 1 APPLICATION  TOPICALLY 2 (TWO) TIMES  DAILY. TO AFFECTED AREAS. Hali Marry, MD Taking Active   Turmeric 500 MG CAPS [id number] Yes Take 1 capsule by mouth daily. [provider] Taking Active             Patient Active Problem List   Diagnosis Date Noted   Thrombocytopenia ([place]) 02/10/2021   Iron deficiency anemia due to chronic blood loss 11/03/2019   Papanicolaou smear of cervix with low grade squamous intraepithelial lesion (LGSIL) 03/22/2017   IBS (irritable bowel syndrome) 03/22/2017   Insomnia 05/08/2014   Low back pain 10/27/2011   BMI 29.0-29.9,adult 06/15/2011   Major depressive disorder, recurrent episode, moderate ([place]) 05/06/2011    Class: Chronic   Essential tremor 01/16/2011   Chronic diarrhea 02/19/2010   UNSPECIFIED HEARING LOSS 07/19/2009   Lumbar degenerative disc disease 11/28/2008   RHINITIS, ALLERGIC NOS 09/23/2006   HYPERCHOLESTEROLEMIA 02/09/2006    Posttraumatic stress disorder 02/09/2006   Migraine 02/09/2006   HYPERTENSION, BENIGN SYSTEMIC 02/09/2006   OSTEOPENIA 02/09/2006    Immunization History  Administered Date(s) Administered   Fluad Quad(high Dose 65+) 02/08/2019, 02/08/2020   Influenza Split 02/19/2012   Influenza Whole 02/04/2006, 03/09/2007, 02/02/2008, 01/24/2010, 01/16/2011   Influenza, High Dose Seasonal PF 03/16/2016, 03/22/2017   Influenza,inj,Quad PF,6+ Mos 01/09/2013, 01/01/2014, 02/12/2015, 12/28/2017   PFIZER(Purple Top)SARS-COV-2 Vaccination 05/15/2019, 06/05/2019, 02/08/2020   Pneumococcal Conjugate-13 11/13/2013   Pneumococcal Polysaccharide-23 01/03/2004   Td 07/03/2003   Tdap 11/13/2013    Conditions to be addressed/monitored: HTN and chronic pain  Care Plan : Medication Management  Updates made by Darius Bump, [place] since 03/05/2021 12:00 AM     Problem: HTN, chronic pain      Long-Range Goal: Disease Progression Prevention   Start Date: 03/05/2021  This Visit's Progress: On track  Priority: High  Note:   Current Barriers:  None at present  Refill for diazepam and soma requested  Pharmacist Clinical Goal(s):  Over the next 180 days, patient will maintain control of chronic conditions as evidenced by medication fill history, lab values, and vital signs  through collaboration with PharmD and provider.   Interventions: 1:1 collaboration with Hali Marry, MD regarding development and update of comprehensive plan of care as evidenced by provider attestation and co-signature Inter-disciplinary care team collaboration (see longitudinal plan of care) Comprehensive medication review performed; medication list updated in electronic medical record  Hypertension:  Controlled; current treatment:enalapril prescribed but not taking due to light-headed and states was SBP 90s;   Current home readings: 120s/60s  Denies hypotensive/hypertensive symptoms  Recommended continue current regimen   Chronic Pain  Controlled; current treatment:soma 350mg  taking at bedtime, diazepam 2mg  taking 1.5 tablets daily ;   Recommended continue current regimen   Patient Goals/Self-Care Activities Over the next 180 days, patient will:  take medications as prescribed  Follow Up Plan: Telephone follow up appointment with care management team member scheduled for:  6-8 months      Medication Assistance: None required.  Patient affirms current coverage meets needs.  Patient's preferred pharmacy is:  [facility] [place], [place] - [place] [address] [place] [address] Phone: [phone] Fax: [phone]  [facility]  ([place]) - [place], [address]  [facility] [address] [place] [address] Phone: [phone] Fax: [phone]  [facility] ([facility]) - [place], [place] [address] Phone: [phone] Fax: [phone]  Uses pill box? Yes Pt endorses 100% compliance  Follow Up:  Patient agrees to Care Plan and Follow-up.  Plan: Telephone follow up appointment with care management team member scheduled for:  6-8 months  Larinda Buttery, PharmD Clinical Pharmacist [facility] [phone]

## 2021-03-11 ENCOUNTER — Other Ambulatory Visit: Payer: Self-pay | Admitting: Family Medicine

## 2021-03-11 DIAGNOSIS — F4310 Post-traumatic stress disorder, unspecified: Secondary | ICD-10-CM

## 2021-03-11 DIAGNOSIS — F331 Major depressive disorder, recurrent, moderate: Secondary | ICD-10-CM

## 2021-04-02 DIAGNOSIS — I10 Essential (primary) hypertension: Secondary | ICD-10-CM

## 2021-04-18 DIAGNOSIS — D469 Myelodysplastic syndrome, unspecified: Secondary | ICD-10-CM | POA: Diagnosis not present

## 2021-04-18 DIAGNOSIS — F32A Depression, unspecified: Secondary | ICD-10-CM | POA: Diagnosis not present

## 2021-04-18 DIAGNOSIS — Z79899 Other long term (current) drug therapy: Secondary | ICD-10-CM | POA: Diagnosis not present

## 2021-04-18 DIAGNOSIS — D4620 Refractory anemia with excess of blasts, unspecified: Secondary | ICD-10-CM | POA: Diagnosis not present

## 2021-04-18 DIAGNOSIS — E611 Iron deficiency: Secondary | ICD-10-CM | POA: Diagnosis not present

## 2021-04-18 DIAGNOSIS — K580 Irritable bowel syndrome with diarrhea: Secondary | ICD-10-CM | POA: Diagnosis not present

## 2021-04-18 DIAGNOSIS — G43909 Migraine, unspecified, not intractable, without status migrainosus: Secondary | ICD-10-CM | POA: Diagnosis not present

## 2021-05-08 ENCOUNTER — Telehealth: Payer: Medicare Other | Admitting: Physician Assistant

## 2021-05-08 ENCOUNTER — Encounter: Payer: Self-pay | Admitting: Physician Assistant

## 2021-05-08 DIAGNOSIS — B9689 Other specified bacterial agents as the cause of diseases classified elsewhere: Secondary | ICD-10-CM

## 2021-05-08 DIAGNOSIS — J019 Acute sinusitis, unspecified: Secondary | ICD-10-CM | POA: Diagnosis not present

## 2021-05-08 MED ORDER — DOXYCYCLINE HYCLATE 100 MG PO TABS: 100.0000 mg | ORAL_TABLET | Freq: Two times a day (BID) | ORAL | 0 refills | Status: DC

## 2021-05-08 NOTE — Progress Notes (Signed)
Virtual Visit Consent   Dawn Bailey, you are scheduled for a virtual visit with a [facility] provider today.     Just as with appointments in the office, your consent must be obtained to participate.  Your consent will be active for this visit and any virtual visit you may have with one of our providers in the next 365 days.     If you have a MyChart account, a copy of this consent can be sent to you electronically.  All virtual visits are billed to your insurance company just like a traditional visit in the office.    As this is a virtual visit, video technology does not allow for your provider to perform a traditional examination.  This may limit your provider's ability to fully assess your condition.  If your provider identifies any concerns that need to be evaluated in person or the need to arrange testing (such as labs, EKG, etc.), we will make arrangements to do so.     Although advances in technology are sophisticated, we cannot ensure that it will always work on either your end or our end.  If the connection with a video visit is poor, the visit may have to be switched to a telephone visit.  With either a video or telephone visit, we are not always able to ensure that we have a secure connection.     I need to obtain your verbal consent now.   Are you willing to proceed with your visit today?    Dawn Bailey has provided verbal consent on 05/08/2021 for a virtual visit (video or telephone).   Dawn Bailey, [place]   Date: 05/08/2021 10:17 AM   Virtual Visit via Video Note   I, Dawn Bailey, connected with  Dawn Bailey  ([id number], 07-25-1934) on 05/08/21 at 10:15 AM EST by a video-enabled telemedicine application and verified that I am speaking with the correct person using two identifiers.  Location: Patient: Virtual Visit Location Patient: Home Provider: Virtual Visit Location Provider: Home Office   I discussed the limitations of evaluation and management by  telemedicine and the availability of in person appointments. The patient expressed understanding and agreed to proceed.    History of Present Illness: Dawn Bailey is a 86 y.o. who identifies as a female who was assigned female at birth, and is being seen today for possible sinusitis. Notes several days of nasal congestion, sinus pressure, ear pain (R) with R maxillary and frontal sinus pain. Denies fever, chills or aches. Feels some fatigue. Denies chest congestion but notes a mild, dry cough. Denies SOB or chest pain. Denies recent travel or sick contact. No known exposure to COVID or flu. Is taking Tylenol OTC for the headache.   Has not taken a home COVID test.   HPI: HPI  Problems:  Patient Active Problem List   Diagnosis Date Noted   Thrombocytopenia ([place]) 02/10/2021   Iron deficiency anemia due to chronic blood loss 11/03/2019   Papanicolaou smear of cervix with low grade squamous intraepithelial lesion (LGSIL) 03/22/2017   IBS (irritable bowel syndrome) 03/22/2017   Insomnia 05/08/2014   Low back pain 10/27/2011   BMI 29.0-29.9,adult 06/15/2011   Major depressive disorder, recurrent episode, moderate ([place]) 05/06/2011    Class: Chronic   Essential tremor 01/16/2011   Chronic diarrhea 02/19/2010   UNSPECIFIED HEARING LOSS 07/19/2009   Lumbar degenerative disc disease 11/28/2008   RHINITIS, ALLERGIC NOS 09/23/2006   HYPERCHOLESTEROLEMIA 02/09/2006  Posttraumatic stress disorder 02/09/2006   Migraine 02/09/2006   HYPERTENSION, BENIGN SYSTEMIC 02/09/2006   OSTEOPENIA 02/09/2006    Allergies:  Allergies  Allergen Reactions   Aripiprazole Other (See Comments)    REACTION: nightmares.   Nsaids Other (See Comments)    Bleeding ulcer   Skelaxin [Metaxalone] Other (See Comments)    Cough, SOB   Tolmetin Other (See Comments)    Bleeding ulcer   Cephalexin Nausea And Vomiting   Lipitor [Atorvastatin] Other (See Comments)    Muscle pain   Simvastatin Other (See Comments)     Muscle aches/    Sulfa Antibiotics Nausea Only   Sulfonamide Derivatives Nausea Only   Tizanidine Anxiety, Diarrhea, Nausea Only and Photosensitivity   Zanaflex [Tizanidine Hcl] Nausea Only and Other (See Comments)    Dizziness,night sweats, confusion   Medications:  Current Outpatient Medications:    sertraline (ZOLOFT) 100 MG tablet, TAKE 1 AND 1/2 TABLETS BY  MOUTH DAILY, Disp: 135 tablet, Rfl: 1   AMBULATORY NON FORMULARY MEDICATION, Medication Name: Rolator with seat. Dx: Chronic back pain, Disp: 1 Units, Rfl: 0   Ascorbic Acid (VITAMIN C WITH ROSE HIPS) 1000 MG tablet, Take 1,000 mg by mouth daily., Disp: , Rfl:    Bacillus Coagulans-Inulin (PROBIOTIC) 1-250 BILLION-MG CAPS, Take 1 capsule by mouth daily., Disp: , Rfl:    carisoprodol (SOMA) 350 MG tablet, Take 1 tablet (350 mg total) by mouth at bedtime. for chronic low back pain., Disp: 90 tablet, Rfl: 0   Cholecalciferol (VITAMIN D3 PO), Take 1 tablet by mouth daily., Disp: , Rfl:    diazepam (VALIUM) 2 MG tablet, Take 1.5 tablets (3 mg total) by mouth daily as needed for anxiety., Disp: 45 tablet, Rfl: 2   doxycycline (VIBRA-TABS) 100 MG tablet, Take 1 tablet (100 mg total) by mouth 2 (two) times daily., Disp: 20 tablet, Rfl: 0   enalapril (VASOTEC) 5 MG tablet, TAKE 1 TABLET BY MOUTH  DAILY (Patient not taking: Reported on 03/05/2021), Disp: 90 tablet, Rfl: 3   hydrOXYzine (ATARAX/VISTARIL) 10 MG tablet, TAKE 1 TABLET BY MOUTH  TWICE DAILY AS NEEDED FOR  ANXIETY (Patient not taking: Reported on 03/05/2021), Disp: 180 tablet, Rfl: 1   loperamide (IMODIUM A-D) 2 MG tablet, Take 2 mg by mouth as needed.  , Disp: , Rfl:    melatonin 3 MG TABS tablet, Take 3 mg by mouth at bedtime. As needed., Disp: , Rfl:    nystatin cream (MYCOSTATIN), APPLY TO AFFECTED AREA(S)  TOPICALLY TWICE DAILY, Disp: 90 g, Rfl: 2   Omega-3 Fatty Acids (OMEGA 3 PO), Take 1 capsule by mouth daily., Disp: , Rfl:    ondansetron (ZOFRAN-ODT) 8 MG disintegrating  tablet, Take 1 tablet (8 mg total) by mouth every 8 (eight) hours as needed for nausea or vomiting., Disp: 20 tablet, Rfl: 1   pantoprazole (PROTONIX) 40 MG tablet, TAKE 1 TABLET BY MOUTH  DAILY (Patient not taking: Reported on 03/05/2021), Disp: 90 tablet, Rfl: 3   promethazine (PHENERGAN) 25 MG tablet, TAKE ONE TABLET BY MOUTH EVERY SIX HOURS AS NEEDED FOR NAUSEA, Disp: 30 tablet, Rfl: 3   traZODone (DESYREL) 100 MG tablet, Take 1.5 tablets (150 mg total) by mouth at bedtime., Disp: 135 tablet, Rfl: 1   triamcinolone cream (KENALOG) 0.5 %, APPLY 1 APPLICATION  TOPICALLY 2 (TWO) TIMES  DAILY. TO AFFECTED AREAS., Disp: 90 g, Rfl: 1   Turmeric 500 MG CAPS, Take 1 capsule by mouth daily., Disp: , Rfl:  Observations/Objective: Patient is well-developed, well-nourished in no acute distress.  Resting comfortably at home.  Head is normocephalic, atraumatic.  No labored breathing. Speech is clear and coherent with logical content.  Patient is alert and oriented at baseline.   Assessment and Plan: 1. Acute bacterial sinusitis - doxycycline (VIBRA-TABS) 100 MG tablet; Take 1 tablet (100 mg total) by mouth 2 (two) times daily.  Dispense: 20 tablet; Refill: 0  Will have her take a home COVID test just to be overly cautious. She is to let us know if positive. Otherwise will continue treatment for just bacterial sinusitis. Rx Doxycycline.  Increase fluids.  Rest.  Saline nasal spray.  Probiotic.  Mucinex as directed.  Humidifier in bedroom. Coricidin HBP OTC.  Call or return to clinic if symptoms are not improving.   Follow Up Instructions: I discussed the assessment and treatment plan with the patient. The patient was provided an opportunity to ask questions and all were answered. The patient agreed with the plan and demonstrated an understanding of the instructions.  A copy of instructions were sent to the patient via MyChart unless otherwise noted below.   The patient was advised to call back or seek  an in-person evaluation if the symptoms worsen or if the condition fails to improve as anticipated.  Time:  I spent 10 minutes with the patient via telehealth technology discussing the above problems/concerns.    Dawn Rio, PA-C

## 2021-05-08 NOTE — Patient Instructions (Signed)
[name], thank you for joining Leeanne Rio, PA-C for today's virtual visit.  While this provider is not your primary care provider (PCP), if your PCP is located in our provider database this encounter information will be shared with them immediately following your visit.  Consent: (Patient) Dawn Bailey provided verbal consent for this virtual visit at the beginning of the encounter.  Current Medications:  Current Outpatient Medications:    sertraline (ZOLOFT) 100 MG tablet, TAKE 1 AND 1/2 TABLETS BY  MOUTH DAILY, Disp: 135 tablet, Rfl: 1   AMBULATORY NON FORMULARY MEDICATION, Medication Name: Rolator with seat. Dx: Chronic back pain, Disp: 1 Units, Rfl: 0   Ascorbic Acid (VITAMIN C WITH ROSE HIPS) 1000 MG tablet, Take 1,000 mg by mouth daily., Disp: , Rfl:    Bacillus Coagulans-Inulin (PROBIOTIC) 1-250 BILLION-MG CAPS, Take 1 capsule by mouth daily., Disp: , Rfl:    carisoprodol (SOMA) 350 MG tablet, Take 1 tablet (350 mg total) by mouth at bedtime. for chronic low back pain., Disp: 90 tablet, Rfl: 0   Cholecalciferol (VITAMIN D3 PO), Take 1 tablet by mouth daily., Disp: , Rfl:    diazepam (VALIUM) 2 MG tablet, Take 1.5 tablets (3 mg total) by mouth daily as needed for anxiety., Disp: 45 tablet, Rfl: 2   enalapril (VASOTEC) 5 MG tablet, TAKE 1 TABLET BY MOUTH  DAILY (Patient not taking: Reported on 03/05/2021), Disp: 90 tablet, Rfl: 3   hydrOXYzine (ATARAX/VISTARIL) 10 MG tablet, TAKE 1 TABLET BY MOUTH  TWICE DAILY AS NEEDED FOR  ANXIETY (Patient not taking: Reported on 03/05/2021), Disp: 180 tablet, Rfl: 1   loperamide (IMODIUM A-D) 2 MG tablet, Take 2 mg by mouth as needed.  , Disp: , Rfl:    melatonin 3 MG TABS tablet, Take 3 mg by mouth at bedtime. As needed., Disp: , Rfl:    nystatin cream (MYCOSTATIN), APPLY TO AFFECTED AREA(S)  TOPICALLY TWICE DAILY, Disp: 90 g, Rfl: 2   Omega-3 Fatty Acids (OMEGA 3 PO), Take 1 capsule by mouth daily., Disp: , Rfl:    ondansetron  (ZOFRAN-ODT) 8 MG disintegrating tablet, Take 1 tablet (8 mg total) by mouth every 8 (eight) hours as needed for nausea or vomiting., Disp: 20 tablet, Rfl: 1   pantoprazole (PROTONIX) 40 MG tablet, TAKE 1 TABLET BY MOUTH  DAILY (Patient not taking: Reported on 03/05/2021), Disp: 90 tablet, Rfl: 3   promethazine (PHENERGAN) 25 MG tablet, TAKE ONE TABLET BY MOUTH EVERY SIX HOURS AS NEEDED FOR NAUSEA, Disp: 30 tablet, Rfl: 3   traZODone (DESYREL) 100 MG tablet, Take 1.5 tablets (150 mg total) by mouth at bedtime., Disp: 135 tablet, Rfl: 1   triamcinolone cream (KENALOG) 0.5 %, APPLY 1 APPLICATION  TOPICALLY 2 (TWO) TIMES  DAILY. TO AFFECTED AREAS., Disp: 90 g, Rfl: 1   Turmeric 500 MG CAPS, Take 1 capsule by mouth daily., Disp: , Rfl:    Medications ordered in this encounter:  No orders of the defined types were placed in this encounter.    *If you need refills on other medications prior to your next appointment, please contact your pharmacy*  Follow-Up: Call back or seek an in-person evaluation if the symptoms worsen or if the condition fails to improve as anticipated.  Other Instructions Take the home COVID test as directed just to be cautious. If positive, message me with your results.   Please take antibiotic as directed.  Increase fluid intake.  Use Saline nasal spray.  Take a  daily multivitamin. Coricidin HBP for cough and congestion.  Place a humidifier in the bedroom.  Please call or return clinic if symptoms are not improving.  Sinusitis Sinusitis is redness, soreness, and swelling (inflammation) of the paranasal sinuses. Paranasal sinuses are air pockets within the bones of your face (beneath the eyes, the middle of the forehead, or above the eyes). In healthy paranasal sinuses, mucus is able to drain out, and air is able to circulate through them by way of your nose. However, when your paranasal sinuses are inflamed, mucus and air can become trapped. This can allow bacteria and other  germs to grow and cause infection. Sinusitis can develop quickly and last only a short time (acute) or continue over a long period (chronic). Sinusitis that lasts for more than 12 weeks is considered chronic.  CAUSES  Causes of sinusitis include: Allergies. Structural abnormalities, such as displacement of the cartilage that separates your nostrils (deviated septum), which can decrease the air flow through your nose and sinuses and affect sinus drainage. Functional abnormalities, such as when the small hairs (cilia) that line your sinuses and help remove mucus do not work properly or are not present. SYMPTOMS  Symptoms of acute and chronic sinusitis are the same. The primary symptoms are pain and pressure around the affected sinuses. Other symptoms include: Upper toothache. Earache. Headache. Bad breath. Decreased sense of smell and taste. A cough, which worsens when you are lying flat. Fatigue. Fever. Thick drainage from your nose, which often is green and may contain pus (purulent). Swelling and warmth over the affected sinuses. DIAGNOSIS  Your caregiver will perform a physical exam. During the exam, your caregiver may: Look in your nose for signs of abnormal growths in your nostrils (nasal polyps). Tap over the affected sinus to check for signs of infection. View the inside of your sinuses (endoscopy) with a special imaging device with a light attached (endoscope), which is inserted into your sinuses. If your caregiver suspects that you have chronic sinusitis, one or more of the following tests may be recommended: Allergy tests. Nasal culture A sample of mucus is taken from your nose and sent to a lab and screened for bacteria. Nasal cytology A sample of mucus is taken from your nose and examined by your caregiver to determine if your sinusitis is related to an allergy. TREATMENT  Most cases of acute sinusitis are related to a viral infection and will resolve on their own within 10  days. Sometimes medicines are prescribed to help relieve symptoms (pain medicine, decongestants, nasal steroid sprays, or saline sprays).  However, for sinusitis related to a bacterial infection, your caregiver will prescribe antibiotic medicines. These are medicines that will help kill the bacteria causing the infection.  Rarely, sinusitis is caused by a fungal infection. In theses cases, your caregiver will prescribe antifungal medicine. For some cases of chronic sinusitis, surgery is needed. Generally, these are cases in which sinusitis recurs more than 3 times per year, despite other treatments. HOME CARE INSTRUCTIONS  Drink plenty of water. Water helps thin the mucus so your sinuses can drain more easily. Use a humidifier. Inhale steam 3 to 4 times a day (for example, sit in the bathroom with the shower running). Apply a warm, moist washcloth to your face 3 to 4 times a day, or as directed by your caregiver. Use saline nasal sprays to help moisten and clean your sinuses. Take over-the-counter or prescription medicines for pain, discomfort, or fever only as directed by your caregiver.  SEEK IMMEDIATE MEDICAL CARE IF: You have increasing pain or severe headaches. You have nausea, vomiting, or drowsiness. You have swelling around your face. You have vision problems. You have a stiff neck. You have difficulty breathing. MAKE SURE YOU:  Understand these instructions. Will watch your condition. Will get help right away if you are not doing well or get worse. Document Released: 04/20/2005 Document Revised: 07/13/2011 Document Reviewed: 05/05/2011 [facility] Patient Information 2014 [place], [place].    If you have been instructed to have an in-person evaluation today at a local Urgent Care facility, please use the link below. It will take you to a list of all of our available [facility], including address, phone number and hours of operation. Please do not delay care.  [facility]  If you or a family member do not have a primary care provider, use the link below to schedule a visit and establish care. When you choose a [facility] primary care physician or advanced practice provider, you gain a long-term partner in health. Find a Primary Care Provider  Learn more about [facility]'s in-office and virtual care options: [facility]

## 2021-05-13 ENCOUNTER — Ambulatory Visit: Payer: Medicare Other | Admitting: Family Medicine

## 2021-05-19 ENCOUNTER — Other Ambulatory Visit: Payer: Self-pay

## 2021-05-19 ENCOUNTER — Encounter: Payer: Self-pay | Admitting: Family Medicine

## 2021-05-19 ENCOUNTER — Ambulatory Visit (INDEPENDENT_AMBULATORY_CARE_PROVIDER_SITE_OTHER): Payer: Medicare Other | Admitting: Family Medicine

## 2021-05-19 VITALS — BP 127/60 | HR 80 | Resp 16 | Ht 66.0 in | Wt 183.0 lb

## 2021-05-19 DIAGNOSIS — F331 Major depressive disorder, recurrent, moderate: Secondary | ICD-10-CM | POA: Diagnosis not present

## 2021-05-19 DIAGNOSIS — M51369 Other intervertebral disc degeneration, lumbar region without mention of lumbar back pain or lower extremity pain: Secondary | ICD-10-CM

## 2021-05-19 DIAGNOSIS — M5136 Other intervertebral disc degeneration, lumbar region: Secondary | ICD-10-CM | POA: Diagnosis not present

## 2021-05-19 DIAGNOSIS — F5101 Primary insomnia: Secondary | ICD-10-CM | POA: Diagnosis not present

## 2021-05-19 DIAGNOSIS — G8929 Other chronic pain: Secondary | ICD-10-CM

## 2021-05-19 DIAGNOSIS — M545 Low back pain, unspecified: Secondary | ICD-10-CM

## 2021-05-19 DIAGNOSIS — G43009 Migraine without aura, not intractable, without status migrainosus: Secondary | ICD-10-CM | POA: Diagnosis not present

## 2021-05-19 DIAGNOSIS — I10 Essential (primary) hypertension: Secondary | ICD-10-CM | POA: Diagnosis not present

## 2021-05-19 DIAGNOSIS — M461 Sacroiliitis, not elsewhere classified: Secondary | ICD-10-CM | POA: Diagnosis not present

## 2021-05-19 MED ORDER — CARISOPRODOL 350 MG PO TABS: 350.0000 mg | ORAL_TABLET | Freq: Every day | ORAL | 0 refills | Status: DC

## 2021-05-19 MED ORDER — DULOXETINE HCL 20 MG PO CPEP: 20.0000 mg | ORAL_CAPSULE | Freq: Every day | ORAL | 1 refills | Status: DC

## 2021-05-19 NOTE — Progress Notes (Signed)
Established Patient Office Visit  Subjective:  Patient ID: Dawn Bailey, female    DOB: [birth year]  Age: 87 y.o. MRN: [id number]  CC:  Chief Complaint  Patient presents with   Follow up    3 month on soma. Patient currently taking 3/4 tablet daily. Patient states Valium is not working.     HPI Dawn Bailey presents for follow-up.  We have been trying to taper her off of some of her medications.  She is completely off of the clonazepam which is fantastic.  We did switch her to Valium but says she really did not like how it made her feel that made her feel sleepy in the middle of the day so she quit taking that.  She has been trying to work hard to decrease her Manuela Neptune so she has been doing half a tab at night and half a tab during the day.  But says that her arthritis really bothers her during the day and keeps her from doing the things that she would like to do.  She has been able to taper off the benzodiazepines completely which is fantastic.  Hypertension-she also wanted to let me know that her blood pressure had been going low.  Initially she was having dizzy spells and started checking her blood pressure systolics were running less than 110 so she actually stopped her lisinopril she was only taking 5 mg and feels better since taking the medication and says her blood pressures have still look great.  Past Medical History:  Diagnosis Date   Allergy    Anemia    Back sprain/strain, thoracic    Depression    Diverticulosis    Gastric ulcer    Hearing loss    Hemorrhoids    Hyperlipidemia    Hypertension    IBS (irritable bowel syndrome)    Iron deficiency anemia due to chronic blood loss 11/03/2019   Lumbar sprain and strain    Migraines    Nevus    atypical   Obesity    Osteopenia    Panic attacks     Past Surgical History:  Procedure Laterality Date   cataract surgery  2004   bilateral    Family History  Problem Relation Age of Onset   Depression Mother     Diabetes Father    Hypertension Father    Stroke Father    Hypertension Brother     Social History   Socioeconomic History   Marital status: Widowed    Spouse name: Not on file   Number of children: 2   Years of education: 15   Highest education level: Associate degree: academic program  Occupational History   Occupation: Press photographer    Comment: retired  Tobacco Use   Smoking status: Former    Types: Cigarettes    Quit date: 05/05/1995    Years since quitting: 26.0   Smokeless tobacco: Never  Vaping Use   Vaping Use: Never used  Substance and Sexual Activity   Alcohol use: Yes    Alcohol/week: 0.5 standard drinks    Types: 1 Standard drinks or equivalent per week    Comment: occasionally   Drug use: No    Comment: Caffiene: 1 cup of tea   Sexual activity: Not Currently    Comment: married, 2 children, LMP over 10 yrs ago, walks everyday, daily caffeine.  Other Topics Concern   Not on file  Social History Narrative   Lives alone. Has two children. Likes  to do crossword puzzles, jigsaw puzzles and read. She has a dog that she likes to take care of.   Social Determinants of Health   Financial Resource Strain: Low Risk    Difficulty of Paying Living Expenses: Not hard at all  Food Insecurity: No Food Insecurity   Worried About Charity fundraiser in the Last Year: Never true   [place] in the Last Year: Never true  Transportation Needs: No Transportation Needs   Lack of Transportation (Medical): No   Lack of Transportation (Non-Medical): No  Physical Activity: Inactive   Days of Exercise per Week: 0 days   Minutes of Exercise per Session: 0 min  Stress: No Stress Concern Present   Feeling of Stress : Not at all  Social Connections: Socially Isolated   Frequency of Communication with Friends and Family: More than three times a week   Frequency of Social Gatherings with Friends and Family: Once a week   Attends Religious Services: Never   Marine scientist or  Organizations: No   Attends Archivist Meetings: Never   Marital Status: Widowed  Human resources officer Violence: Not At Risk   Fear of Current or Ex-Partner: No   Emotionally Abused: No   Physically Abused: No   Sexually Abused: No    Outpatient Medications Prior to Visit  Medication Sig Dispense Refill   AMBULATORY NON FORMULARY MEDICATION Medication Name: Rolator with seat. Dx: Chronic back pain 1 Units 0   Bacillus Coagulans-Inulin (PROBIOTIC) 1-250 BILLION-MG CAPS Take 1 capsule by mouth daily.     Cholecalciferol (VITAMIN D3 PO) Take 1 tablet by mouth daily.     loperamide (IMODIUM A-D) 2 MG tablet Take 2 mg by mouth as needed.       magnesium 30 MG tablet Take 30 mg by mouth 2 (two) times daily.     melatonin 3 MG TABS tablet Take 3 mg by mouth at bedtime. As needed.     nystatin cream (MYCOSTATIN) APPLY TO AFFECTED AREA(S)  TOPICALLY TWICE DAILY 90 g 2   Omega-3 Fatty Acids (OMEGA 3 PO) Take 1 capsule by mouth daily.     ondansetron (ZOFRAN-ODT) 8 MG disintegrating tablet Take 1 tablet (8 mg total) by mouth every 8 (eight) hours as needed for nausea or vomiting. 20 tablet 1   sertraline (ZOLOFT) 100 MG tablet TAKE 1 AND 1/2 TABLETS BY  MOUTH DAILY 135 tablet 1   traZODone (DESYREL) 100 MG tablet Take 1.5 tablets (150 mg total) by mouth at bedtime. 135 tablet 1   triamcinolone cream (KENALOG) 0.5 % APPLY 1 APPLICATION  TOPICALLY 2 (TWO) TIMES  DAILY. TO AFFECTED AREAS. 90 g 1   Turmeric 500 MG CAPS Take 1 capsule by mouth daily.     carisoprodol (SOMA) 350 MG tablet Take 1 tablet (350 mg total) by mouth at bedtime. for chronic low back pain. (Patient taking differently: Take 350 mg by mouth at bedtime. for chronic low back pain. Patient states she only takes about 3/4 of a tablet daily) 90 tablet 0   diazepam (VALIUM) 2 MG tablet Take 1.5 tablets (3 mg total) by mouth daily as needed for anxiety. (Patient taking differently: Take 3 mg by mouth daily as needed for anxiety.  Patient takes 1/2 tablet at night) 45 tablet 2   promethazine (PHENERGAN) 25 MG tablet TAKE ONE TABLET BY MOUTH EVERY SIX HOURS AS NEEDED FOR NAUSEA 30 tablet 3   Ascorbic Acid (VITAMIN C WITH  ROSE HIPS) 1000 MG tablet Take 1,000 mg by mouth daily.     doxycycline (VIBRA-TABS) 100 MG tablet Take 1 tablet (100 mg total) by mouth 2 (two) times daily. 20 tablet 0   enalapril (VASOTEC) 5 MG tablet TAKE 1 TABLET BY MOUTH  DAILY (Patient not taking: Reported on 03/05/2021) 90 tablet 3   hydrOXYzine (ATARAX/VISTARIL) 10 MG tablet TAKE 1 TABLET BY MOUTH  TWICE DAILY AS NEEDED FOR  ANXIETY (Patient not taking: Reported on 03/05/2021) 180 tablet 1   pantoprazole (PROTONIX) 40 MG tablet TAKE 1 TABLET BY MOUTH  DAILY (Patient not taking: Reported on 03/05/2021) 90 tablet 3   No facility-administered medications prior to visit.    Allergies  Allergen Reactions   Aripiprazole Other (See Comments)    REACTION: nightmares.   Nsaids Other (See Comments)    Bleeding ulcer   Skelaxin [Metaxalone] Other (See Comments)    Cough, SOB   Tolmetin Other (See Comments)    Bleeding ulcer   Cephalexin Nausea And Vomiting   Lipitor [Atorvastatin] Other (See Comments)    Muscle pain   Simvastatin Other (See Comments)    Muscle aches/    Sulfa Antibiotics Nausea Only   Sulfonamide Derivatives Nausea Only   Tizanidine Anxiety, Diarrhea, Nausea Only and Photosensitivity   Zanaflex [Tizanidine Hcl] Nausea Only and Other (See Comments)    Dizziness,night sweats, confusion    ROS Review of Systems    Objective:    Physical Exam Constitutional:      Appearance: Normal appearance. She is well-developed.  HENT:     Head: Normocephalic and atraumatic.  Cardiovascular:     Rate and Rhythm: Normal rate and regular rhythm.     Heart sounds: Normal heart sounds.  Pulmonary:     Effort: Pulmonary effort is normal.     Breath sounds: Normal breath sounds.  Skin:    General: Skin is warm and dry.  Neurological:      Mental Status: She is alert and oriented to person, place, and time.  Psychiatric:        Behavior: Behavior normal.    BP 127/60    Pulse 80    Resp 16    Ht 5\' 6"  (1.676 m)    Wt 183 lb (83 kg)    SpO2 96%    BMI 29.54 kg/m  Wt Readings from Last 3 Encounters:  05/19/21 183 lb (83 kg)  02/10/21 186 lb (84.4 kg)  09/25/20 180 lb (81.6 kg)     There are no preventive care reminders to display for this patient.  There are no preventive care reminders to display for this patient.  Lab Results  Component Value Date   TSH 2.58 05/20/2018   Lab Results  Component Value Date   WBC 3.9 (L) 07/11/2020   HGB 11.1 (L) 07/11/2020   HCT 35.2 (L) 07/11/2020   MCV 74.9 (L) 07/11/2020   PLT 71 (L) 07/11/2020   Lab Results  Component Value Date   NA 137 07/11/2020   K 4.0 07/11/2020   CO2 31 07/11/2020   GLUCOSE 93 07/11/2020   BUN 9 07/11/2020   CREATININE 0.70 07/11/2020   BILITOT 0.4 07/11/2020   ALKPHOS 67 07/11/2020   AST 18 07/11/2020   ALT 12 07/11/2020   PROT 7.1 07/11/2020   ALBUMIN 4.7 07/11/2020   CALCIUM 9.7 07/11/2020   ANIONGAP 6 07/11/2020   Lab Results  Component Value Date   CHOL 261 (H) 09/28/2019   Lab  Results  Component Value Date   HDL 72 09/28/2019   Lab Results  Component Value Date   LDLCALC 163 (H) 09/28/2019   Lab Results  Component Value Date   TRIG 136 09/28/2019   Lab Results  Component Value Date   CHOLHDL 3.6 09/28/2019   Lab Results  Component Value Date   HGBA1C 5.1 09/28/2019      Assessment & Plan:   Problem List Items Addressed This Visit       Cardiovascular and Mediastinum   Migraine - Primary (Chronic)   Relevant Medications   DULoxetine (CYMBALTA) 20 MG capsule   carisoprodol (SOMA) 350 MG tablet   HYPERTENSION, BENIGN SYSTEMIC (Chronic)    Blood pressure looks fantastic today off of her medication some to go ahead and remove it from the medication list and we will just keep an eye on it.         Musculoskeletal and Integument   SI (sacroiliac) joint inflammation (HCC)   Relevant Medications   carisoprodol (SOMA) 350 MG tablet   Lumbar degenerative disc disease    Gust options.  She has some degenerative spondylosis in her back.  Last MRI was in 2017.  I think she would benefit from something like Cymbalta which we could certainly try since I have been trying to consistently taper her off of the [facility] which does provide some pain relief for her.      Relevant Medications   carisoprodol (SOMA) 350 MG tablet     Hematopoietic and Hemostatic   Thrombocytopenia (HCC)     Other   Major depressive disorder, recurrent episode, moderate ([place])    Gust options of tapering off the sertraline and using Cymbalta in its place he could help with depression but also help reduce her pain by about 30%.  He is willing to make the switch.  Taper given on after visit summary.      Relevant Medications   DULoxetine (CYMBALTA) 20 MG capsule   Low back pain   Relevant Medications   carisoprodol (SOMA) 350 MG tablet   Insomnia    Using trazodone for sleep and off of benzodiazepines.      We did discuss increasing activity level and working on doing 10 minutes of stretching daily.  Meds ordered this encounter  Medications   DULoxetine (CYMBALTA) 20 MG capsule    Sig: Take 1 capsule (20 mg total) by mouth daily.    Dispense:  30 capsule    Refill:  1   carisoprodol (SOMA) 350 MG tablet    Sig: Take 1 tablet (350 mg total) by mouth at bedtime. for chronic low back pain.    Dispense:  90 tablet    Refill:  0    Follow-up: Return in about 7 weeks (around 07/07/2021) for New start medication.    [name], MD

## 2021-05-19 NOTE — Assessment & Plan Note (Signed)
Gust options of tapering off the sertraline and using Cymbalta in its place he could help with depression but also help reduce her pain by about 30%.  He is willing to make the switch.  Taper given on after visit summary.

## 2021-05-19 NOTE — Assessment & Plan Note (Addendum)
Blood pressure looks fantastic today off of her medication some to go ahead and remove it from the medication list and we will just keep an eye on it.

## 2021-05-19 NOTE — Assessment & Plan Note (Signed)
Gust options.  She has some degenerative spondylosis in her back.  Last MRI was in 2017.  I think she would benefit from something like Cymbalta which we could certainly try since I have been trying to consistently taper her off of the [facility] which does provide some pain relief for her.

## 2021-05-19 NOTE — Assessment & Plan Note (Signed)
Using trazodone for sleep and off of benzodiazepines.

## 2021-05-19 NOTE — Patient Instructions (Signed)
Please decrease your sertraline to 1 tab daily for 10 days. Then decrease to half a tab daily for 10 days. Then take a half a tab every other day for 8 days.  We can always go down to 25 mg if you would prefer. When she start taking it every other day we can start the duloxetine just follow the instructions on the bottle.

## 2021-05-31 ENCOUNTER — Encounter: Payer: Self-pay | Admitting: Family Medicine

## 2021-06-06 DIAGNOSIS — Z79899 Other long term (current) drug therapy: Secondary | ICD-10-CM | POA: Diagnosis not present

## 2021-06-06 DIAGNOSIS — D696 Thrombocytopenia, unspecified: Secondary | ICD-10-CM | POA: Diagnosis not present

## 2021-06-06 DIAGNOSIS — D469 Myelodysplastic syndrome, unspecified: Secondary | ICD-10-CM | POA: Diagnosis not present

## 2021-06-06 DIAGNOSIS — G4700 Insomnia, unspecified: Secondary | ICD-10-CM | POA: Diagnosis not present

## 2021-06-06 DIAGNOSIS — F32A Depression, unspecified: Secondary | ICD-10-CM | POA: Diagnosis not present

## 2021-06-06 DIAGNOSIS — G43909 Migraine, unspecified, not intractable, without status migrainosus: Secondary | ICD-10-CM | POA: Diagnosis not present

## 2021-06-06 DIAGNOSIS — D508 Other iron deficiency anemias: Secondary | ICD-10-CM | POA: Diagnosis not present

## 2021-07-01 ENCOUNTER — Encounter: Payer: Self-pay | Admitting: Family Medicine

## 2021-07-01 MED ORDER — DULOXETINE HCL 20 MG PO CPEP: 20.0000 mg | ORAL_CAPSULE | Freq: Every day | ORAL | 0 refills | Status: DC

## 2021-07-08 ENCOUNTER — Ambulatory Visit (INDEPENDENT_AMBULATORY_CARE_PROVIDER_SITE_OTHER): Payer: Medicare Other | Admitting: Family Medicine

## 2021-07-08 ENCOUNTER — Other Ambulatory Visit: Payer: Self-pay

## 2021-07-08 ENCOUNTER — Encounter: Payer: Self-pay | Admitting: Family Medicine

## 2021-07-08 VITALS — BP 129/59 | HR 97 | Resp 16 | Ht 66.0 in | Wt 177.0 lb

## 2021-07-08 DIAGNOSIS — F5101 Primary insomnia: Secondary | ICD-10-CM | POA: Diagnosis not present

## 2021-07-08 DIAGNOSIS — I10 Essential (primary) hypertension: Secondary | ICD-10-CM

## 2021-07-08 DIAGNOSIS — M5136 Other intervertebral disc degeneration, lumbar region: Secondary | ICD-10-CM

## 2021-07-08 DIAGNOSIS — F331 Major depressive disorder, recurrent, moderate: Secondary | ICD-10-CM | POA: Diagnosis not present

## 2021-07-08 MED ORDER — DULOXETINE HCL 30 MG PO CPEP: 30.0000 mg | ORAL_CAPSULE | Freq: Every day | ORAL | 1 refills | Status: DC

## 2021-07-08 NOTE — Assessment & Plan Note (Signed)
BP looks great off of medication ?

## 2021-07-08 NOTE — Assessment & Plan Note (Signed)
Please try to cut your Trazodone back to 125 mg nightly.   ?

## 2021-07-08 NOTE — Assessment & Plan Note (Signed)
The change to Cymbalta has been really positive. Has helped with mood and with her Lumbar DDD ?

## 2021-07-08 NOTE — Assessment & Plan Note (Signed)
Cymbalta is helpful. Has noticed can go to 2-3 stores if out, instead of just one.  Still very sore when she gets home but felt like she can do more.   ?

## 2021-07-08 NOTE — Progress Notes (Signed)
Established Patient Office Visit  Subjective:  Patient ID: Dawn Bailey, female    DOB: 1934-12-10  Age: 87 y.o. MRN: [id number]  CC:  Chief Complaint  Patient presents with   Medication Follow up    Patient states Cymbalta is working well for her.     HPI Dawn Bailey presents for   F/U new start Cymbalta, after transitioning off sertraline. She is doing really well on it. She feels like it has been really helpful.  She is still off her clonazepam.    F/U migraines - She is still working on decreasing her soma for her migraines.   Past Medical History:  Diagnosis Date   Allergy    Anemia    Back sprain/strain, thoracic    Depression    Diverticulosis    Gastric ulcer    Hearing loss    Hemorrhoids    Hyperlipidemia    Hypertension    IBS (irritable bowel syndrome)    Iron deficiency anemia due to chronic blood loss 11/03/2019   Lumbar sprain and strain    Migraines    Nevus    atypical   Obesity    Osteopenia    Panic attacks     Past Surgical History:  Procedure Laterality Date   cataract surgery  2004   bilateral    Family History  Problem Relation Age of Onset   Depression Mother    Diabetes Father    Hypertension Father    Stroke Father    Hypertension Brother     Social History   Socioeconomic History   Marital status: Widowed    Spouse name: Not on file   Number of children: 2   Years of education: 15   Highest education level: Associate degree: academic program  Occupational History   Occupation: Press photographer    Comment: retired  Tobacco Use   Smoking status: Former    Types: Cigarettes    Quit date: 05/05/1995    Years since quitting: 26.1   Smokeless tobacco: Never  Vaping Use   Vaping Use: Never used  Substance and Sexual Activity   Alcohol use: Yes    Alcohol/week: 0.5 standard drinks    Types: 1 Standard drinks or equivalent per week    Comment: occasionally   Drug use: No    Comment: Caffiene: 1 cup of tea   Sexual activity:  Not Currently    Comment: married, 2 children, LMP over 10 yrs ago, walks everyday, daily caffeine.  Other Topics Concern   Not on file  Social History Narrative   Lives alone. Has two children. Likes to do crossword puzzles, jigsaw puzzles and read. She has a dog that she likes to take care of.   Social Determinants of Health   Financial Resource Strain: Low Risk    Difficulty of Paying Living Expenses: Not hard at all  Food Insecurity: No Food Insecurity   Worried About Charity fundraiser in the Last Year: Never true   [place] in the Last Year: Never true  Transportation Needs: No Transportation Needs   Lack of Transportation (Medical): No   Lack of Transportation (Non-Medical): No  Physical Activity: Inactive   Days of Exercise per Week: 0 days   Minutes of Exercise per Session: 0 min  Stress: No Stress Concern Present   Feeling of Stress : Not at all  Social Connections: Socially Isolated   Frequency of Communication with Friends and Family: More than  three times a week   Frequency of Social Gatherings with Friends and Family: Once a week   Attends Religious Services: Never   Marine scientist or Organizations: No   Attends Archivist Meetings: Never   Marital Status: Widowed  Human resources officer Violence: Not At Risk   Fear of Current or Ex-Partner: No   Emotionally Abused: No   Physically Abused: No   Sexually Abused: No    Outpatient Medications Prior to Visit  Medication Sig Dispense Refill   Bacillus Coagulans-Inulin (PROBIOTIC) 1-250 BILLION-MG CAPS Take 1 capsule by mouth daily.     carisoprodol (SOMA) 350 MG tablet Take 1 tablet (350 mg total) by mouth at bedtime. for chronic low back pain. 90 tablet 0   Cholecalciferol (VITAMIN D3 PO) Take 1 tablet by mouth daily.     loperamide (IMODIUM A-D) 2 MG tablet Take 2 mg by mouth as needed.       magnesium 30 MG tablet Take 30 mg by mouth 2 (two) times daily.     melatonin 3 MG TABS tablet Take 3  mg by mouth at bedtime. As needed.     nystatin cream (MYCOSTATIN) APPLY TO AFFECTED AREA(S)  TOPICALLY TWICE DAILY 90 g 2   Omega-3 Fatty Acids (OMEGA 3 PO) Take 1 capsule by mouth daily.     ondansetron (ZOFRAN-ODT) 8 MG disintegrating tablet Take 1 tablet (8 mg total) by mouth every 8 (eight) hours as needed for nausea or vomiting. 20 tablet 1   traZODone (DESYREL) 100 MG tablet Take 1.5 tablets (150 mg total) by mouth at bedtime. 135 tablet 1   triamcinolone cream (KENALOG) 0.5 % APPLY 1 APPLICATION  TOPICALLY 2 (TWO) TIMES  DAILY. TO AFFECTED AREAS. 90 g 1   Turmeric 500 MG CAPS Take 1 capsule by mouth daily.     AMBULATORY NON FORMULARY MEDICATION Medication Name: Rolator with seat. Dx: Chronic back pain 1 Units 0   DULoxetine (CYMBALTA) 20 MG capsule Take 1 capsule (20 mg total) by mouth daily. 30 capsule 0   sertraline (ZOLOFT) 100 MG tablet TAKE 1 AND 1/2 TABLETS BY  MOUTH DAILY 135 tablet 1   No facility-administered medications prior to visit.    Allergies  Allergen Reactions   Aripiprazole Other (See Comments)    REACTION: nightmares.   Nsaids Other (See Comments)    Bleeding ulcer   Skelaxin [Metaxalone] Other (See Comments)    Cough, SOB   Tolmetin Other (See Comments)    Bleeding ulcer   Cephalexin Nausea And Vomiting   Lipitor [Atorvastatin] Other (See Comments)    Muscle pain   Simvastatin Other (See Comments)    Muscle aches/    Sulfa Antibiotics Nausea Only   Sulfonamide Derivatives Nausea Only   Tizanidine Anxiety, Diarrhea, Nausea Only and Photosensitivity   Zanaflex [Tizanidine Hcl] Nausea Only and Other (See Comments)    Dizziness,night sweats, confusion    ROS Review of Systems    Objective:    Physical Exam Constitutional:      Appearance: Normal appearance. She is well-developed.  HENT:     Head: Normocephalic and atraumatic.  Cardiovascular:     Rate and Rhythm: Normal rate and regular rhythm.     Heart sounds: Normal heart sounds.   Pulmonary:     Effort: Pulmonary effort is normal.     Breath sounds: Normal breath sounds.  Skin:    General: Skin is warm and dry.  Neurological:     Mental  Status: She is alert and oriented to person, place, and time.  Psychiatric:        Behavior: Behavior normal.    BP (!) 129/59    Pulse 97    Resp 16    Ht '5\' 6"'$  (1.676 m)    Wt 177 lb (80.3 kg)    SpO2 96%    BMI 28.57 kg/m  Wt Readings from Last 3 Encounters:  07/08/21 177 lb (80.3 kg)  05/19/21 183 lb (83 kg)  02/10/21 186 lb (84.4 kg)     There are no preventive care reminders to display for this patient.  There are no preventive care reminders to display for this patient.  Lab Results  Component Value Date   TSH 2.58 05/20/2018   Lab Results  Component Value Date   WBC 3.9 (L) 07/11/2020   HGB 11.1 (L) 07/11/2020   HCT 35.2 (L) 07/11/2020   MCV 74.9 (L) 07/11/2020   PLT 71 (L) 07/11/2020   Lab Results  Component Value Date   NA 137 07/11/2020   K 4.0 07/11/2020   CO2 31 07/11/2020   GLUCOSE 93 07/11/2020   BUN 9 07/11/2020   CREATININE 0.70 07/11/2020   BILITOT 0.4 07/11/2020   ALKPHOS 67 07/11/2020   AST 18 07/11/2020   ALT 12 07/11/2020   PROT 7.1 07/11/2020   ALBUMIN 4.7 07/11/2020   CALCIUM 9.7 07/11/2020   ANIONGAP 6 07/11/2020   Lab Results  Component Value Date   CHOL 261 (H) 09/28/2019   Lab Results  Component Value Date   HDL 72 09/28/2019   Lab Results  Component Value Date   LDLCALC 163 (H) 09/28/2019   Lab Results  Component Value Date   TRIG 136 09/28/2019   Lab Results  Component Value Date   CHOLHDL 3.6 09/28/2019   Lab Results  Component Value Date   HGBA1C 5.1 09/28/2019      Assessment & Plan:   Problem List Items Addressed This Visit       Cardiovascular and Mediastinum   HYPERTENSION, BENIGN SYSTEMIC (Chronic)    BP looks great off of medication        Musculoskeletal and Integument   Lumbar degenerative disc disease    Cymbalta is helpful.  Has noticed can go to 2-3 stores if out, instead of just one.  Still very sore when she gets home but felt like she can do more.          Other   Major depressive disorder, recurrent episode, moderate (HCC) - Primary    The change to Cymbalta has been really positive. Has helped with mood and with her Lumbar DDD      Relevant Medications   DULoxetine (CYMBALTA) 30 MG capsule   Insomnia    Please try to cut your Trazodone back to 125 mg nightly.         Meds ordered this encounter  Medications   DULoxetine (CYMBALTA) 30 MG capsule    Sig: Take 1 capsule (30 mg total) by mouth daily.    Dispense:  30 capsule    Refill:  1    Follow-up: Return in about 2 months (around 09/07/2021) for change in dose in mood medication .    [name], MD

## 2021-07-08 NOTE — Patient Instructions (Signed)
Please try to cut your Trazodone back to '125mg'$  nightly.   ?

## 2021-08-03 ENCOUNTER — Encounter: Payer: Self-pay | Admitting: Family Medicine

## 2021-08-04 ENCOUNTER — Other Ambulatory Visit: Payer: Self-pay

## 2021-08-04 DIAGNOSIS — F331 Major depressive disorder, recurrent, moderate: Secondary | ICD-10-CM

## 2021-08-04 MED ORDER — DULOXETINE HCL 30 MG PO CPEP: 30.0000 mg | ORAL_CAPSULE | Freq: Every day | ORAL | 1 refills | Status: DC

## 2021-08-22 DIAGNOSIS — Z79899 Other long term (current) drug therapy: Secondary | ICD-10-CM | POA: Diagnosis not present

## 2021-08-22 DIAGNOSIS — G4700 Insomnia, unspecified: Secondary | ICD-10-CM | POA: Diagnosis not present

## 2021-08-22 DIAGNOSIS — K580 Irritable bowel syndrome with diarrhea: Secondary | ICD-10-CM | POA: Diagnosis not present

## 2021-08-22 DIAGNOSIS — D469 Myelodysplastic syndrome, unspecified: Secondary | ICD-10-CM | POA: Diagnosis not present

## 2021-08-25 ENCOUNTER — Ambulatory Visit (INDEPENDENT_AMBULATORY_CARE_PROVIDER_SITE_OTHER): Payer: Medicare Other | Admitting: Family Medicine

## 2021-08-25 DIAGNOSIS — Z0000 Encounter for general adult medical examination without abnormal findings: Secondary | ICD-10-CM

## 2021-08-25 NOTE — Progress Notes (Signed)
? ? ?MEDICARE ANNUAL WELLNESS VISIT ? ?08/25/2021 ? ?Telephone Visit Disclaimer ?This Medicare AWV was conducted by telephone due to national recommendations for restrictions regarding the COVID-19 Pandemic (e.g. social distancing).  I verified, using two identifiers, that I am speaking with Dawn Bailey or their authorized healthcare agent. I discussed the limitations, risks, security, and privacy concerns of performing an evaluation and management service by telephone and the potential availability of an in-person appointment in the future. The patient expressed understanding and agreed to proceed.  ?Location of Patient: Home ?Location of Provider (nurse):  In the office. ? ?Subjective:  ? ? ?Dawn Bailey is a 87 y.o. female patient of Metheney, Rene Kocher, MD who had a Medicare Annual Wellness Visit today via telephone. Dawn Bailey is Retired and lives alone. she has 2 children. she reports that she is socially active and does interact with friends/family regularly. she is minimally physically active and enjoys doing crossword puzzles, jigsaw puzzles and read. . ? ?Patient Care Team: ?Hali Marry, MD as PCP - General ?Darius Bump, [facility] as Pharmacist (Pharmacist) ? ? ?  08/25/2021  ?  2:08 PM 07/26/2020  ? 11:01 AM 03/19/2020  ? 11:53 AM 01/18/2020  ? 10:47 AM 12/07/2019  ? 10:44 AM 11/02/2019  ? 11:33 AM 06/19/2019  ? 11:06 AM  ?Advanced Directives  ?Does Patient Have a Medical Advance Directive? Yes No No No Yes No Yes  ?Type of Advance Directive Living will  Living will  Living will  Living will  ?Does patient want to make changes to medical advance directive? No - Patient declined      No - Patient declined  ?Would patient like information on creating a medical advance directive?  No - Patient declined No - Patient declined No - Patient declined No - Patient declined No - Patient declined   ? ? ?Hospital Utilization Over the Past 12 Months: ?# of hospitalizations or ER visits: 0 ?# of surgeries:  0 ? ?Review of Systems    ?Patient reports that her overall health is better compared to last year. ? ?History obtained from chart review and the patient ? ?Patient Reported Readings (BP, Pulse, CBG, Weight, etc) ?none ? ?Pain Assessment ?Pain : 0-10 ?Pain Score: 8  ?Pain Type: Acute pain ?Pain Location: Head ?Pain Descriptors / Indicators: Headache ?Pain Onset: In the past 7 days ?Pain Frequency: Intermittent ?Pain Relieving Factors: Antihistamine and Tylenol ? ?Pain Relieving Factors: Antihistamine and Tylenol ? ?Current Medications & Allergies (verified) ?Allergies as of 08/25/2021   ? ?   Reactions  ? Aripiprazole Other (See Comments)  ? REACTION: nightmares.  ? Nsaids Other (See Comments)  ? Bleeding ulcer  ? Skelaxin [metaxalone] Other (See Comments)  ? Cough, SOB  ? Tolmetin Other (See Comments)  ? Bleeding ulcer  ? Cephalexin Nausea And Vomiting  ? Lipitor [atorvastatin] Other (See Comments)  ? Muscle pain  ? Simvastatin Other (See Comments)  ? Muscle aches/  ? Sulfa Antibiotics Nausea Only  ? Sulfonamide Derivatives Nausea Only  ? Tizanidine Anxiety, Diarrhea, Nausea Only, Photosensitivity  ? Zanaflex [tizanidine Hcl] Nausea Only, Other (See Comments)  ? Dizziness,night sweats, confusion  ? ?  ? ?  ?Medication List  ?  ? ?  ? Accurate as of August 25, 2021  2:22 PM. If you have any questions, ask your nurse or doctor.  ?  ?  ? ?  ? ?carisoprodol 350 MG tablet ?Commonly known as: SOMA ?Take 1 tablet (350 mg  total) by mouth at bedtime. for chronic low back pain. ?  ?DULoxetine 30 MG capsule ?Commonly known as: Cymbalta ?Take 1 capsule (30 mg total) by mouth daily. ?  ?loperamide 2 MG tablet ?Commonly known as: IMODIUM A-D ?Take 2 mg by mouth as needed. ?  ?magnesium 30 MG tablet ?Take 30 mg by mouth 2 (two) times daily. Takes it once a day. ?  ?melatonin 3 MG Tabs tablet ?Take 3 mg by mouth at bedtime. As needed. ?  ?nystatin cream ?Commonly known as: MYCOSTATIN ?APPLY TO AFFECTED AREA(S)  TOPICALLY TWICE  DAILY ?What changed: See the new instructions. ?  ?OMEGA 3 PO ?Take 1 capsule by mouth daily. ?  ?ondansetron 8 MG disintegrating tablet ?Commonly known as: ZOFRAN-ODT ?Take 1 tablet (8 mg total) by mouth every 8 (eight) hours as needed for nausea or vomiting. ?  ?Probiotic 1-250 BILLION-MG Caps ?Take 1 capsule by mouth daily. ?  ?traZODone 100 MG tablet ?Commonly known as: DESYREL ?Take 1.5 tablets (150 mg total) by mouth at bedtime. ?  ?triamcinolone cream 0.5 % ?Commonly known as: KENALOG ?APPLY 1 APPLICATION  TOPICALLY 2 (TWO) TIMES  DAILY. TO AFFECTED AREAS. ?  ?Turmeric 500 MG Caps ?Take 1 capsule by mouth daily. ?  ?VITAMIN D3 PO ?Take 1 tablet by mouth daily. ?  ? ?  ? ? ?History (reviewed): ?Past Medical History:  ?Diagnosis Date  ? Allergy   ? Anemia   ? Back sprain/strain, thoracic   ? Depression   ? Diverticulosis   ? Gastric ulcer   ? Hearing loss   ? Hemorrhoids   ? Hyperlipidemia   ? Hypertension   ? IBS (irritable bowel syndrome)   ? Iron deficiency anemia due to chronic blood loss 11/03/2019  ? Lumbar sprain and strain   ? Migraines   ? Nevus   ? atypical  ? Obesity   ? Osteopenia   ? Panic attacks   ? ?Past Surgical History:  ?Procedure Laterality Date  ? cataract surgery  2004  ? bilateral  ? ?Family History  ?Problem Relation Age of Onset  ? Depression Mother   ? Diabetes Father   ? Hypertension Father   ? Stroke Father   ? Hypertension Brother   ? ?Social History  ? ?Socioeconomic History  ? Marital status: Widowed  ?  Spouse name: Not on file  ? Number of children: 2  ? Years of education: 89  ? Highest education level: Associate degree: academic program  ?Occupational History  ? Occupation: Press photographer  ?  Comment: retired  ?Tobacco Use  ? Smoking status: Former  ?  Types: Cigarettes  ?  Quit date: 05/05/1995  ?  Years since quitting: 26.3  ? Smokeless tobacco: Never  ?Vaping Use  ? Vaping Use: Never used  ?Substance and Sexual Activity  ? Alcohol use: Yes  ?  Comment: occasionally  ? Drug use: No  ?  Sexual activity: Not Currently  ?Other Topics Concern  ? Not on file  ?Social History Narrative  ? Lives alone. Has two children. Likes to do crossword puzzles, jigsaw puzzles and read. She has a dog that she likes to take care of.  ?   ? ?Social Determinants of Health  ? ?Financial Resource Strain: Low Risk   ? Difficulty of Paying Living Expenses: Not hard at all  ?Food Insecurity: No Food Insecurity  ? Worried About Charity fundraiser in the Last Year: Never true  ? Ran Out of Food  in the Last Year: Never true  ?Transportation Needs: No Transportation Needs  ? Lack of Transportation (Medical): No  ? Lack of Transportation (Non-Medical): No  ?Physical Activity: Inactive  ? Days of Exercise per Week: 0 days  ? Minutes of Exercise per Session: 0 min  ?Stress: No Stress Concern Present  ? Feeling of Stress : Not at all  ?Social Connections: Socially Isolated  ? Frequency of Communication with Friends and Family: More than three times a week  ? Frequency of Social Gatherings with Friends and Family: Twice a week  ? Attends Religious Services: Never  ? Active Member of Clubs or Organizations: No  ? Attends Archivist Meetings: Never  ? Marital Status: Widowed  ? ? ?Activities of Daily Living ? ?  08/25/2021  ?  2:12 PM  ?In your present state of health, do you have any difficulty performing the following activities:  ?Hearing? 0  ?Vision? 0  ?Difficulty concentrating or making decisions? 0  ?Walking or climbing stairs? 0  ?Dressing or bathing? 0  ?Doing errands, shopping? 0  ?Preparing Food and eating ? N  ?Using the Toilet? N  ?In the past six months, have you accidently leaked urine? Y  ?Comment urgency  ?Do you have problems with loss of bowel control? Y  ?Comment urgency  ?Managing your Medications? N  ?Managing your Finances? N  ?Housekeeping or managing your Housekeeping? N  ? ? ?Patient Education/ Literacy ?How often do you need to have someone help you when you read instructions, pamphlets, or other  written materials from your doctor or pharmacy?: 1 - Never ?What is the last grade level you completed in school?: Associates degree ? ?Exercise ?Current Exercise Habits: The patient does not participate in regular ex

## 2021-08-25 NOTE — Patient Instructions (Addendum)
?MEDICARE ANNUAL WELLNESS VISIT ?Health Maintenance Summary and Written Plan of Care ? ?Dawn Bailey , ? ?Thank you for allowing me to perform your Medicare Annual Wellness Visit and for your ongoing commitment to your health.  ? ?Health Maintenance & Immunization History ?Health Maintenance  ?Topic Date Due  ? Zoster Vaccines- Shingrix (1 of 2) 11/24/2021 (Originally 01/27/1954)  ? INFLUENZA VACCINE  12/02/2021  ? TETANUS/TDAP  11/14/2023  ? Pneumonia Vaccine 38+ Years old  Completed  ? DEXA SCAN  Completed  ? COVID-19 Vaccine  Completed  ? HPV VACCINES  Aged Out  ? ?Immunization History  ?Administered Date(s) Administered  ? Fluad Quad(high Dose 65+) 02/08/2019, 02/08/2020  ? Influenza Split 02/19/2012  ? Influenza Whole 02/04/2006, 03/09/2007, 02/02/2008, 01/24/2010, 01/16/2011  ? Influenza, High Dose Seasonal PF 03/16/2016, 03/22/2017, 02/07/2021  ? Influenza,inj,Quad PF,6+ Mos 01/09/2013, 01/01/2014, 02/12/2015, 12/28/2017  ? Influenza-Unspecified 03/04/2021  ? PFIZER(Purple Top)SARS-COV-2 Vaccination 05/15/2019, 06/05/2019, 02/08/2020  ? Pension scheme manager 34yr & up 03/04/2021  ? Pneumococcal Conjugate-13 11/13/2013  ? Pneumococcal Polysaccharide-23 01/03/2004  ? Td 07/03/2003  ? Tdap 11/13/2013  ? ? ?These are the patient goals that we discussed: ? Goals Addressed   ? ?  ?  ?  ?  ?  ? This Visit's Progress  ?   Patient Stated (pt-stated)     ?   08/25/2021 ?AWV Goal: Exercise for General Health ? ?Patient will verbalize understanding of the benefits of increased physical activity: ?Exercising regularly is important. It will improve your overall fitness, flexibility, and endurance. ?Regular exercise also will improve your overall health. It can help you control your weight, reduce stress, and improve your bone density. ?Over the next year, patient will increase physical activity as tolerated with a goal of at least 150 minutes of moderate physical activity per week.  ?You can tell that you  are exercising at a moderate intensity if your heart starts beating faster and you start breathing faster but can still hold a conversation. ?Moderate-intensity exercise ideas include: ?Walking 1 mile (1.6 km) in about 15 minutes ?Biking ?Hiking ?Golfing ?Dancing ?Water aerobics ?Patient will verbalize understanding of everyday activities that increase physical activity by providing examples like the following: ?Yard work, such as: ?Pushing a lConservation officer, nature?Raking and bagging leaves ?Washing your car ?Pushing a stroller ?Shoveling snow ?Gardening ?Washing windows or floors ?Patient will be able to explain general safety guidelines for exercising:  ?Before you start a new exercise program, talk with your health care provider. ?Do not exercise so much that you hurt yourself, feel dizzy, or get very short of breath. ?Wear comfortable clothes and wear shoes with good support. ?Drink plenty of water while you exercise to prevent dehydration or heat stroke. ?Work out until your breathing and your heartbeat get faster.  ?  ? ?  ?  ? ?This is a list of Health Maintenance Items that are overdue or due now: ?There are no preventive care reminders to display for this patient. ?  ? ?Orders/Referrals Placed Today: ?No orders of the defined types were placed in this encounter. ? ?(Contact our referral department at [phone] you have not spoken with someone about your referral appointment within the next 5 days)  ? ? ?Follow-up Plan ?Follow-up with MHali Marry MD as planned ?Schedule your shingrix vaccine at your pharmacy. ?Medicare wellness visit in one year. ?Patient will access on my chart. ? ?  ?Health Maintenance, Female ?Adopting a healthy lifestyle and getting preventive care are  important in promoting health and wellness. Ask your health care provider about: ?The right schedule for you to have regular tests and exams. ?Things you can do on your own to prevent diseases and keep yourself healthy. ?What should  I know about diet, weight, and exercise? ?Eat a healthy diet ? ?Eat a diet that includes plenty of vegetables, fruits, low-fat dairy products, and lean protein. ?Do not eat a lot of foods that are high in solid fats, added sugars, or sodium. ?Maintain a healthy weight ?Body mass index (BMI) is used to identify weight problems. It estimates body fat based on height and weight. Your health care provider can help determine your BMI and help you achieve or maintain a healthy weight. ?Get regular exercise ?Get regular exercise. This is one of the most important things you can do for your health. Most adults should: ?Exercise for at least 150 minutes each week. The exercise should increase your heart rate and make you sweat (moderate-intensity exercise). ?Do strengthening exercises at least twice a week. This is in addition to the moderate-intensity exercise. ?Spend less time sitting. Even light physical activity can be beneficial. ?Watch cholesterol and blood lipids ?Have your blood tested for lipids and cholesterol at 85 years of age, then have this test every 5 years. ?Have your cholesterol levels checked more often if: ?Your lipid or cholesterol levels are high. ?You are older than 85 years of age. ?You are at high risk for heart disease. ?What should I know about cancer screening? ?Depending on your health history and family history, you may need to have cancer screening at various ages. This may include screening for: ?Breast cancer. ?Cervical cancer. ?Colorectal cancer. ?Skin cancer. ?Lung cancer. ?What should I know about heart disease, diabetes, and high blood pressure? ?Blood pressure and heart disease ?High blood pressure causes heart disease and increases the risk of stroke. This is more likely to develop in people who have high blood pressure readings or are overweight. ?Have your blood pressure checked: ?Every 3-5 years if you are 67-54 years of age. ?Every year if you are 64 years old or  older. ?Diabetes ?Have regular diabetes screenings. This checks your fasting blood sugar level. Have the screening done: ?Once every three years after age 22 if you are at a normal weight and have a low risk for diabetes. ?More often and at a younger age if you are overweight or have a high risk for diabetes. ?What should I know about preventing infection? ?Hepatitis B ?If you have a higher risk for hepatitis B, you should be screened for this virus. Talk with your health care provider to find out if you are at risk for hepatitis B infection. ?Hepatitis C ?Testing is recommended for: ?Everyone born from [birth year] through 1963. ?Anyone with known risk factors for hepatitis C. ?Sexually transmitted infections (STIs) ?Get screened for STIs, including gonorrhea and chlamydia, if: ?You are sexually active and are younger than 85 years of age. ?You are older than 85 years of age and your health care provider tells you that you are at risk for this type of infection. ?Your sexual activity has changed since you were last screened, and you are at increased risk for chlamydia or gonorrhea. Ask your health care provider if you are at risk. ?Ask your health care provider about whether you are at high risk for HIV. Your health care provider may recommend a prescription medicine to help prevent HIV infection. If you choose to take medicine to prevent HIV, you  should first get tested for HIV. You should then be tested every 3 months for as long as you are taking the medicine. ?Pregnancy ?If you are about to stop having your period (premenopausal) and you may become pregnant, seek counseling before you get pregnant. ?Take 400 to 800 micrograms (mcg) of folic acid every day if you become pregnant. ?Ask for birth control (contraception) if you want to prevent pregnancy. ?Osteoporosis and menopause ?Osteoporosis is a disease in which the bones lose minerals and strength with aging. This can result in bone fractures. If you are 4 years old  or older, or if you are at risk for osteoporosis and fractures, ask your health care provider if you should: ?Be screened for bone loss. ?Take a calcium or vitamin D supplement to lower your risk of fractures. ?Be given

## 2021-09-08 ENCOUNTER — Ambulatory Visit: Payer: Medicare Other | Admitting: Family Medicine

## 2021-09-16 ENCOUNTER — Other Ambulatory Visit: Payer: Self-pay | Admitting: Family Medicine

## 2021-09-16 DIAGNOSIS — F5101 Primary insomnia: Secondary | ICD-10-CM

## 2021-09-18 ENCOUNTER — Ambulatory Visit (INDEPENDENT_AMBULATORY_CARE_PROVIDER_SITE_OTHER): Payer: Medicare Other | Admitting: Family Medicine

## 2021-09-18 ENCOUNTER — Encounter: Payer: Self-pay | Admitting: Family Medicine

## 2021-09-18 VITALS — BP 131/62 | HR 86 | Ht 66.0 in | Wt 171.0 lb

## 2021-09-18 DIAGNOSIS — F5101 Primary insomnia: Secondary | ICD-10-CM | POA: Diagnosis not present

## 2021-09-18 DIAGNOSIS — F331 Major depressive disorder, recurrent, moderate: Secondary | ICD-10-CM

## 2021-09-18 DIAGNOSIS — G43009 Migraine without aura, not intractable, without status migrainosus: Secondary | ICD-10-CM

## 2021-09-18 DIAGNOSIS — F514 Sleep terrors [night terrors]: Secondary | ICD-10-CM | POA: Diagnosis not present

## 2021-09-18 MED ORDER — NYSTATIN 100000 UNIT/GM EX CREA: TOPICAL_CREAM | Freq: Two times a day (BID) | CUTANEOUS | 2 refills | Status: DC

## 2021-09-18 NOTE — Progress Notes (Signed)
Established Patient Office Visit  Subjective   Patient ID: Dawn Bailey, female    DOB: 08-04-1934  Age: 87 y.o. MRN: [id number]  Chief Complaint  Patient presents with   mood    HPI  Today for follow-up on Cymbalta she had just started it and was doing well when I saw her 2 months ago.  I encouraged her to try to cut back on her trazodone at night.  She said she did try but she was also try to cut back her Soma at the same time and it just was not successful so she is back on the 150 mg of trazodone for now she does feel like it helps her sleep and rest.  But she has been taking anywhere from a quarter, and a half to a whole tab of the Soma.  He also wanted to let me know that she recently had a migraine when she went to the beach.  She feels like she sometimes has "let down" migraines when she has had a lot going on and a lot of tension and then when things are finally done and she is starting to feel better and relax that usually when she gets a migraine up until recently she had not had 1 in quite some time.  She is really try cutting back on the Soma to quarter tab or even a half a tab but finds herself taking extra in the middle the night when she is not sleeping because it does help her rest.  I have been trying to wean her off of this medication.  Insomnia-still struggling with night terrors.  She said she is really dealt with them her whole life.  But when she does not take something sedating like the Soma they are usually worse.  And it impacts her sleep quality.    ROS    Objective:     BP 131/62   Pulse 86   Ht '5\' 6"'$  (1.676 m)   Wt 171 lb (77.6 kg)   SpO2 98%   BMI 27.60 kg/m    Physical Exam Vitals and nursing note reviewed.  Constitutional:      Appearance: She is well-developed.  HENT:     Head: Normocephalic and atraumatic.  Cardiovascular:     Rate and Rhythm: Normal rate and regular rhythm.     Heart sounds: Normal heart sounds.  Pulmonary:      Effort: Pulmonary effort is normal.     Breath sounds: Normal breath sounds.  Skin:    General: Skin is warm and dry.  Neurological:     Mental Status: She is alert and oriented to person, place, and time.  Psychiatric:        Behavior: Behavior normal.     No results found for any visits on 09/18/21.    The ASCVD Risk score (Arnett DK, et al., 2019) failed to calculate for the following reasons:   The 2019 ASCVD risk score is only valid for ages 69 to 53    Assessment & Plan:   Problem List Items Addressed This Visit       Cardiovascular and Mediastinum   Migraine (Chronic)    Still getting occasional migraines.  They were unfortunately recently triggered while she was traveling.  But otherwise they have been much better than usual.         Other   Night terrors    Unclear what to do about her night terrors.  She says that  taking the muscle relaxer seems to help but I really think that is just because its sedating her and so she is probably less likely to actually remember her night terrors versus being awake enough to partially remember them.  1 option would be to consider referral to a sleep specialist.  I can also reach out to one of my psychiatry colleagues to see if they have any thoughts.  But I really do want to get her off of the [place].       Major depressive disorder, recurrent episode, moderate ([place])    Do feel like Cymbalta has actually been a really great choice for her right now.  Continue current regimen.  Plan to follow-up in 6 months.       Insomnia - Primary    For now continue with trazodone 150.  We can always work on cutting back later I think trying to decrease that along with cutting back the supplement the same time was really challenging.  We are going to get rid of the Soma.  Did not tolerate Skelaxin or tizanidine.        Return in about 6 months (around 03/21/2022) for bp.    [name], MD

## 2021-09-18 NOTE — Assessment & Plan Note (Signed)
For now continue with trazodone 150.  We can always work on cutting back later I think trying to decrease that along with cutting back the supplement the same time was really challenging.  We are going to get rid of the Soma.  Did not tolerate Skelaxin or tizanidine.

## 2021-09-18 NOTE — Progress Notes (Signed)
Pt reports that she is doing well on the Cymbalta.   She stated that she has tried the Clearasil for the places on her face that was recommended however, this hasn't helped.

## 2021-09-19 NOTE — Assessment & Plan Note (Signed)
Unclear what to do about her night terrors.  She says that taking the muscle relaxer seems to help but I really think that is just because its sedating her and so she is probably less likely to actually remember her night terrors versus being awake enough to partially remember them.  1 option would be to consider referral to a sleep specialist.  I can also reach out to one of my psychiatry colleagues to see if they have any thoughts.  But I really do want to get her off of the [place].

## 2021-09-19 NOTE — Assessment & Plan Note (Signed)
Do feel like Cymbalta has actually been a really great choice for her right now.  Continue current regimen.  Plan to follow-up in 6 months.

## 2021-09-23 ENCOUNTER — Other Ambulatory Visit: Payer: Self-pay | Admitting: Family Medicine

## 2021-09-23 ENCOUNTER — Encounter: Payer: Self-pay | Admitting: Family Medicine

## 2021-09-23 DIAGNOSIS — Z23 Encounter for immunization: Secondary | ICD-10-CM

## 2021-09-23 MED ORDER — HYDROXYZINE PAMOATE 25 MG PO CAPS: 25.0000 mg | ORAL_CAPSULE | Freq: Three times a day (TID) | ORAL | 0 refills | Status: DC | PRN

## 2021-09-23 NOTE — Telephone Encounter (Signed)
Meds ordered this encounter  Medications   hydrOXYzine (VISTARIL) 25 MG capsule    Sig: Take 1 capsule (25 mg total) by mouth every 8 (eight) hours as needed.    Dispense:  30 capsule    Refill:  0    

## 2021-09-23 NOTE — Assessment & Plan Note (Signed)
Still getting occasional migraines.  They were unfortunately recently triggered while she was traveling.  But otherwise they have been much better than usual.

## 2021-09-25 ENCOUNTER — Encounter: Payer: Self-pay | Admitting: Family Medicine

## 2021-09-25 DIAGNOSIS — F331 Major depressive disorder, recurrent, moderate: Secondary | ICD-10-CM

## 2021-09-26 MED ORDER — RIZATRIPTAN BENZOATE 5 MG PO TABS
5.0000 mg | ORAL_TABLET | ORAL | 3 refills | Status: DC | PRN
Start: 2021-09-26 — End: 2022-01-20

## 2021-10-03 ENCOUNTER — Encounter: Payer: Self-pay | Admitting: Family Medicine

## 2021-10-03 MED ORDER — DIAZEPAM 2 MG PO TABS: 2.0000 mg | ORAL_TABLET | Freq: Every day | ORAL | 0 refills | Status: DC | PRN

## 2021-10-13 MED ORDER — DULOXETINE HCL 60 MG PO CPEP: 60.0000 mg | ORAL_CAPSULE | Freq: Every day | ORAL | 1 refills | Status: DC

## 2021-10-13 NOTE — Addendum Note (Signed)
Addended by: [name] D on: 10/13/2021 12:58 PM   Modules accepted: Orders

## 2021-10-13 NOTE — Telephone Encounter (Signed)
New Rx sent for Cymbalta 60 mg. She will need to cut her trazodone in half when she goes up.  Meds ordered this encounter  Medications   rizatriptan (MAXALT) 5 MG tablet    Sig: Take 1 tablet (5 mg total) by mouth as needed for migraine. May repeat in 2 hours if needed    Dispense:  10 tablet    Refill:  3   DULoxetine (CYMBALTA) 60 MG capsule    Sig: Take 1 capsule (60 mg total) by mouth daily.    Dispense:  90 capsule    Refill:  1

## 2021-10-21 ENCOUNTER — Encounter: Payer: Self-pay | Admitting: Family Medicine

## 2021-10-27 ENCOUNTER — Encounter: Payer: Self-pay | Admitting: Family Medicine

## 2021-11-14 DIAGNOSIS — D469 Myelodysplastic syndrome, unspecified: Secondary | ICD-10-CM | POA: Diagnosis not present

## 2021-11-14 DIAGNOSIS — D509 Iron deficiency anemia, unspecified: Secondary | ICD-10-CM | POA: Diagnosis not present

## 2021-11-14 DIAGNOSIS — Z79899 Other long term (current) drug therapy: Secondary | ICD-10-CM | POA: Diagnosis not present

## 2021-11-14 DIAGNOSIS — F32A Depression, unspecified: Secondary | ICD-10-CM | POA: Diagnosis not present

## 2021-11-14 DIAGNOSIS — D61818 Other pancytopenia: Secondary | ICD-10-CM | POA: Diagnosis not present

## 2021-11-14 DIAGNOSIS — K580 Irritable bowel syndrome with diarrhea: Secondary | ICD-10-CM | POA: Diagnosis not present

## 2021-11-18 ENCOUNTER — Telehealth: Payer: Self-pay | Admitting: Pharmacist

## 2021-11-18 ENCOUNTER — Telehealth: Payer: Medicare Other

## 2021-11-18 NOTE — Telephone Encounter (Signed)
Unsuccessful attempt x2 to contact patient for follow-up phone call for CCM services with pharmacist.  Will route to schedule team for reschedule.  Latesha Chesney, PharmD Clinical Pharmacist [facility] [phone]   

## 2021-11-18 NOTE — Progress Notes (Deleted)
Chronic Care Management Pharmacy Note  11/18/2021 Name:  Dawn Bailey MRN:  [id number] DOB:  1934/12/01  Summary: addressed HTN, chronic pain.   Recommendations/Changes made from today's visit: None, however patient mentioned needing refill soon of diazepam and soma. She states she will message Dr. Madilyn Fireman to request since she was using 1.5 tablets (and ran out of diazepam sooner than RX calls for).  Plan: f/u with pharmacist in 6-8 months  Subjective: Dawn Bailey is an 87 y.o. year old female who is a primary patient of Metheney, Rene Kocher, MD.  The CCM team was consulted for assistance with disease management and care coordination needs.    Engaged with patient by telephone for initial visit in response to provider referral for pharmacy case management and/or care coordination services.   Consent to Services:  The patient was given information about Chronic Care Management services, agreed to services, and gave verbal consent prior to initiation of services.  Please see initial visit note for detailed documentation.   Patient Care Team: Hali Marry, MD as PCP - General Darius Bump, [facility] as Pharmacist (Pharmacist)  Recent consult visits:  02/07/21 Jerrye Noble MD - Hematology - Seen for Thrombocytopenia - No medication changes noted - Follow up in 2 months  09/25/20 [place] for gastroenteritis - Referral to Hematology/ Oncology -  Start ondansteron 8  mg - No follow up noted    Hospital visits:  None in previous 6 months  Objective:  Lab Results  Component Value Date   CREATININE 0.70 07/11/2020   CREATININE 0.75 03/19/2020   CREATININE 0.75 01/18/2020    Lab Results  Component Value Date   HGBA1C 5.1 09/28/2019        Component Value Date/Time   CHOL 261 (H) 09/28/2019 0918   TRIG 136 09/28/2019 0918   TRIG 152 03/06/2009 0000   HDL 72 09/28/2019 0918   CHOLHDL 3.6 09/28/2019 0918   VLDL 36 (H)  02/17/2016 0941   LDLCALC 163 (H) 09/28/2019 0918       Latest Ref Rng & Units 07/11/2020    9:39 AM 03/19/2020   11:10 AM 01/18/2020   10:22 AM  Hepatic Function  Total Protein 6.5 - 8.1 g/dL 7.1  7.1  6.8   Albumin 3.5 - 5.0 g/dL 4.7  4.5  4.4   AST 15 - 41 U/L $Remo'18  22  24   'ZzrIa$ ALT 0 - 44 U/L $Remo'12  16  18   'JnDjU$ Alk Phosphatase 38 - 126 U/L 67  67  68   Total Bilirubin 0.3 - 1.2 mg/dL 0.4  0.4  0.4     Lab Results  Component Value Date/Time   TSH 2.58 05/20/2018 09:04 AM   TSH 1.34 04/08/2017 11:13 AM       Latest Ref Rng & Units 07/11/2020    9:39 AM 03/19/2020   11:10 AM 01/18/2020   10:22 AM  CBC  WBC 4.0 - 10.5 K/uL 3.9  2.8  3.4   Hemoglobin 12.0 - 15.0 g/dL 11.1  11.2  10.6   Hematocrit 36.0 - 46.0 % 35.2  36.0  33.4   Platelets 150 - 400 K/uL 71  78  96      Social History   Tobacco Use  Smoking Status Former   Types: Cigarettes   Quit date: 05/05/1995   Years since quitting: 26.5  Smokeless Tobacco Never   BP Readings from Last 3  Encounters:  09/18/21 131/62  07/08/21 (!) 129/59  05/19/21 127/60   Pulse Readings from Last 3 Encounters:  09/18/21 86  07/08/21 97  05/19/21 80   Wt Readings from Last 3 Encounters:  09/18/21 171 lb (77.6 kg)  07/08/21 177 lb (80.3 kg)  05/19/21 183 lb (83 kg)    Assessment: Review of patient past medical history, allergies, medications, health status, including review of consultants reports, laboratory and other test data, was performed as part of comprehensive evaluation and provision of chronic care management services.   SDOH:  (Social Determinants of Health) assessments and interventions performed:    CCM Care Plan  Allergies  Allergen Reactions   Aripiprazole Other (See Comments)    REACTION: nightmares.   Nsaids Other (See Comments)    Bleeding ulcer   Skelaxin [Metaxalone] Other (See Comments)    Cough, SOB   Tolmetin Other (See Comments)    Bleeding ulcer   Cephalexin Nausea And Vomiting   Lipitor  [Atorvastatin] Other (See Comments)    Muscle pain   Simvastatin Other (See Comments)    Muscle aches/    Sulfa Antibiotics Nausea Only   Sulfonamide Derivatives Nausea Only   Tizanidine Anxiety, Diarrhea, Nausea Only and Photosensitivity   Zanaflex [Tizanidine Hcl] Nausea Only and Other (See Comments)    Dizziness,night sweats, confusion    Medications Reviewed Today     Reviewed by Teddy Spike, CMA (Certified Medical Assistant) on 09/18/21 at 1004  Med List Status: <None>   Medication Order Taking? Sig Documenting Provider Last Dose Status Informant  Bacillus Coagulans-Inulin (PROBIOTIC) 1-250 BILLION-MG CAPS [id number] Yes Take 1 capsule by mouth daily. [provider] Taking Active   carisoprodol (SOMA) 350 MG tablet [id number] Yes Take 1 tablet (350 mg total) by mouth at bedtime. for chronic low back pain. Hali Marry, MD Taking Active   Cholecalciferol (VITAMIN D3 PO) [id number] Yes Take 1 tablet by mouth daily. [provider] Taking Active   DULoxetine (CYMBALTA) 30 MG capsule [id number] Yes Take 1 capsule (30 mg total) by mouth daily. Hali Marry, MD Taking Active   loperamide (IMODIUM A-D) 2 MG tablet [id number] Yes Take 2 mg by mouth as needed.   [provider] Taking Active   magnesium 30 MG tablet [id number] Yes Take 30 mg by mouth 2 (two) times daily. Takes it once a day. [provider] Taking Active   melatonin 3 MG TABS tablet [id number] Yes Take 3 mg by mouth at bedtime. As needed. [provider] Taking Active   nystatin cream (MYCOSTATIN) [id number] Yes APPLY TO AFFECTED AREA(S)  TOPICALLY TWICE DAILY  Patient taking differently: As needed.   Hali Marry, MD Taking Active   Omega-3 Fatty Acids (OMEGA 3 PO) [id number] Yes Take 1 capsule by mouth daily. [provider] Taking Active   ondansetron (ZOFRAN-ODT) 8 MG disintegrating tablet [id number] Yes Take 1 tablet (8 mg total) by mouth every 8  (eight) hours as needed for nausea or vomiting. Emeterio Reeve, DO Taking Active   traZODone (DESYREL) 100 MG tablet [id number] Yes TAKE 1 AND 1/2 TABLETS BY MOUTH AT BEDTIME Hali Marry, MD Taking Active   triamcinolone cream (KENALOG) 0.5 % [id number] Yes APPLY 1 APPLICATION  TOPICALLY 2 (TWO) TIMES  DAILY. TO AFFECTED AREAS. Hali Marry, MD Taking Active   Turmeric 500 MG CAPS [id number] Yes Take 1 capsule by mouth daily. [provider] Taking Active  Patient Active Problem List   Diagnosis Date Noted   Night terrors 09/18/2021   SI (sacroiliac) joint inflammation ([place]) 05/19/2021   Thrombocytopenia ([place]) 02/10/2021   Iron deficiency anemia due to chronic blood loss 11/03/2019   Papanicolaou smear of cervix with low grade squamous intraepithelial lesion (LGSIL) 03/22/2017   IBS (irritable bowel syndrome) 03/22/2017   Insomnia 05/08/2014   Low back pain 10/27/2011   BMI 29.0-29.9,adult 06/15/2011   Major depressive disorder, recurrent episode, moderate ([place]) 05/06/2011    Class: Chronic   Essential tremor 01/16/2011   Chronic diarrhea 02/19/2010   UNSPECIFIED HEARING LOSS 07/19/2009   Lumbar degenerative disc disease 11/28/2008   RHINITIS, ALLERGIC NOS 09/23/2006   HYPERCHOLESTEROLEMIA 02/09/2006   Posttraumatic stress disorder 02/09/2006   Migraine 02/09/2006   HYPERTENSION, BENIGN SYSTEMIC 02/09/2006   OSTEOPENIA 02/09/2006    Immunization History  Administered Date(s) Administered   Fluad Quad(high Dose 65+) 02/08/2019, 02/08/2020   Influenza Split 02/19/2012   Influenza Whole 02/04/2006, 03/09/2007, 02/02/2008, 01/24/2010, 01/16/2011   Influenza, High Dose Seasonal PF 03/16/2016, 03/22/2017, 02/07/2021   Influenza,inj,Quad PF,6+ Mos 01/09/2013, 01/01/2014, 02/12/2015, 12/28/2017   Influenza-Unspecified 03/04/2021   PFIZER(Purple Top)SARS-COV-2 Vaccination 05/15/2019, 06/05/2019, 02/08/2020   Pfizer Covid-19 Vaccine Bivalent  Booster 36yrs & up 03/04/2021   Pneumococcal Conjugate-13 11/13/2013   Pneumococcal Polysaccharide-23 01/03/2004   Td 07/03/2003   Tdap 11/13/2013    Conditions to be addressed/monitored: HTN and chronic pain  There are no care plans that you recently modified to display for this patient.    Medication Assistance: None required.  Patient affirms current coverage meets needs.  Patient's preferred pharmacy is:  [facility] [place], [place] [facility] [address] [place] [address] Phone: [phone] Fax: [phone]  [facility] ([place], [place] [facility] [address] [place] [address] [place] [address] Phone: [phone] Fax: [phone]  [facility] ([facility] ) - [place], [place] [address] Phone: [phone] Fax: [phone]  [id number] [place], [place] - [address] [place] [address] Phone: [phone] Fax: [phone]   Uses pill box? Yes Pt endorses 100% compliance  Follow Up:  Patient agrees to Care Plan and Follow-up.  Plan: Telephone follow up appointment with care management team member scheduled for:  6-8 months  Larinda Buttery, PharmD Clinical Pharmacist [facility] [phone]

## 2021-11-27 NOTE — Chronic Care Management (AMB) (Signed)
  Chronic Care Management Note  11/27/2021 Name: Dawn Bailey MRN: [id number] DOB: [birth year]  [name] is a 87 y.o. year old female who is a primary care patient of Hali Marry, MD and is actively engaged with the care management team. I reached out to [name] by phone today to assist with re-scheduling a follow up visit with the Pharmacist  Follow up plan: Unsuccessful telephone outreach attempt made. A HIPAA compliant phone message was left for the patient providing contact information and requesting a return call.   Julian Hy, [place] Direct Dial: [phone]

## 2021-11-29 ENCOUNTER — Other Ambulatory Visit: Payer: Self-pay | Admitting: Family Medicine

## 2021-11-29 DIAGNOSIS — F5101 Primary insomnia: Secondary | ICD-10-CM

## 2021-12-05 NOTE — Chronic Care Management (AMB) (Signed)
  Chronic Care Management Note  12/05/2021 Name: Dawn Bailey MRN: [id number] DOB: October 17, 1934  Dawn Bailey is a 87 y.o. year old female who is a primary care patient of Hali Marry, MD and is actively engaged with the care management team. I reached out to [name] by phone today to assist with re-scheduling a follow up visit with the Pharmacist  Follow up plan: We have been unable to make contact with the patient for follow up. The care management team is available to follow up with the patient after provider conversation with the patient regarding recommendation for care management engagement and subsequent re-referral to the care management team.   Julian Hy, [place] Direct Dial: [phone]

## 2022-01-09 ENCOUNTER — Other Ambulatory Visit: Payer: Self-pay | Admitting: Family Medicine

## 2022-01-09 DIAGNOSIS — F331 Major depressive disorder, recurrent, moderate: Secondary | ICD-10-CM

## 2022-01-20 ENCOUNTER — Ambulatory Visit (INDEPENDENT_AMBULATORY_CARE_PROVIDER_SITE_OTHER): Payer: Medicare Other | Admitting: Family Medicine

## 2022-01-20 ENCOUNTER — Encounter: Payer: Self-pay | Admitting: Family Medicine

## 2022-01-20 VITALS — BP 152/68 | HR 85 | Wt 167.0 lb

## 2022-01-20 DIAGNOSIS — W57XXXA Bitten or stung by nonvenomous insect and other nonvenomous arthropods, initial encounter: Secondary | ICD-10-CM

## 2022-01-20 DIAGNOSIS — Z23 Encounter for immunization: Secondary | ICD-10-CM | POA: Diagnosis not present

## 2022-01-20 DIAGNOSIS — R21 Rash and other nonspecific skin eruption: Secondary | ICD-10-CM | POA: Diagnosis not present

## 2022-01-20 DIAGNOSIS — S1086XA Insect bite of other specified part of neck, initial encounter: Secondary | ICD-10-CM | POA: Diagnosis not present

## 2022-01-20 NOTE — Progress Notes (Signed)
Acute Office Visit  Subjective:     Patient ID: Dawn Bailey, female    DOB: October 10, 1934, 87 y.o.   MRN: [id number]  Chief Complaint  Patient presents with   Insect Bite    HPI Patient is in today for 2 concerns.  She has a erythematous rash that seems to come and go on her hands she says its been there for several weeks.  She has been using a prescription steroid cream that she had and felt like it helped but then when she quits using it it tends to come back it can be itchy at times.  And it feels raised at times.  Then yesterday she noticed a couple of bumps on the right side of her neck that were extremely itchy and burns a little bit especially if she scratches them she has been using a topical Benadryl cream and a cortisone cream and that helps some.   F/U migraine - she says the new medication caused her to feel crazye.    ROS      Objective:    BP (!) 152/68   Pulse 85   Wt 167 lb (75.8 kg)   SpO2 98%   BMI 26.95 kg/m    Physical Exam Vitals reviewed.  Constitutional:      Appearance: She is well-developed.  HENT:     Head: Normocephalic and atraumatic.  Eyes:     Conjunctiva/sclera: Conjunctivae normal.  Cardiovascular:     Rate and Rhythm: Normal rate.  Pulmonary:     Effort: Pulmonary effort is normal.  Skin:    General: Skin is dry.     Coloration: Skin is not pale.     Comments: On right side of neck she has about 7 clustered papules they look excoriated.  The largest being may be half a centimeter in size.  Neurological:     Mental Status: She is alert and oriented to person, place, and time.  Psychiatric:        Behavior: Behavior normal.     No results found for any visits on 01/20/22.       Assessment & Plan:   Problem List Items Addressed This Visit   None Visit Diagnoses     Need for influenza vaccination    -  Primary   Relevant Orders   Flu Vaccine QUAD High Dose(Fluad) (Completed)   Rash       Relevant Orders   CBC with  Differential/Platelet   Sedimentation rate   Insect bite of other part of neck, initial encounter           Rash on hands-unclear etiology it does not have a scale like eczema.  It is a little bit papular.  But is not coming and going in the typical pattern of hives its only in her hands.  Could consider contact dermatitis.  The steroid does seem to help which is good but we will check a CBC to see if cell lines are normal and if eosinophils are elevated.  Also, just check a sed rate for other autoimmune type issues.  Insect bites on the right side of the neck-they do look most consistent with an insect bite and she was outside yesterday okay to continue the Benadryl and/or cortisone cream and see if this continues to help.  Monitor for healing over the next 1 to 2 weeks but avoid scratching if at all possible.  No orders of the defined types were placed in this encounter.  No follow-ups on file.  [name], MD

## 2022-01-21 LAB — CBC WITH DIFFERENTIAL/PLATELET
Absolute Monocytes: 345 cells/uL (ref 200–950)
Basophils Absolute: 9 cells/uL (ref 0–200)
Basophils Relative: 0.3 %
Eosinophils Absolute: 0 cells/uL — ABNORMAL LOW (ref 15–500)
Eosinophils Relative: 0 %
HCT: 32.9 % — ABNORMAL LOW (ref 35.0–45.0)
Hemoglobin: 10.8 g/dL — ABNORMAL LOW (ref 11.7–15.5)
Lymphs Abs: 1273 cells/uL (ref 850–3900)
MCH: 24 pg — ABNORMAL LOW (ref 27.0–33.0)
MCHC: 32.8 g/dL (ref 32.0–36.0)
MCV: 73.1 fL — ABNORMAL LOW (ref 80.0–100.0)
Monocytes Relative: 11.9 %
Neutro Abs: 1273 cells/uL — ABNORMAL LOW (ref 1500–7800)
Neutrophils Relative %: 43.9 %
Platelets: 52 10*3/uL — ABNORMAL LOW (ref 140–400)
RBC: 4.5 10*6/uL (ref 3.80–5.10)
RDW: 15.4 % — ABNORMAL HIGH (ref 11.0–15.0)
Total Lymphocyte: 43.9 %
WBC: 2.9 10*3/uL — ABNORMAL LOW (ref 3.8–10.8)

## 2022-01-21 LAB — SEDIMENTATION RATE: Sed Rate: 2 mm/h (ref 0–30)

## 2022-01-22 ENCOUNTER — Encounter: Payer: Self-pay | Admitting: Family Medicine

## 2022-01-22 DIAGNOSIS — R21 Rash and other nonspecific skin eruption: Secondary | ICD-10-CM

## 2022-01-22 NOTE — Progress Notes (Signed)
Hi Dawn Bailey, your blood cell count lines are pretty stable.  The inflammatory marker is normal.  And the eosinophils which I was particularly interested in was actually low.  Typically it is high in allergic reactions.  So I still have a great explanation for the rash on your hands.  Would you like me to refer you to dermatology?

## 2022-01-22 NOTE — Telephone Encounter (Signed)
Orders Placed This Encounter  Procedures   Ambulatory referral to Dermatology    Referral Priority:   Routine    Referral Type:   Consultation    Referral Reason:   Specialty Services Required    Requested Specialty:   Dermatology    Number of Visits Requested:   1

## 2022-01-23 ENCOUNTER — Encounter: Payer: Self-pay | Admitting: Family Medicine

## 2022-02-08 ENCOUNTER — Other Ambulatory Visit: Payer: Self-pay | Admitting: Family Medicine

## 2022-02-20 DIAGNOSIS — K589 Irritable bowel syndrome without diarrhea: Secondary | ICD-10-CM | POA: Diagnosis not present

## 2022-02-20 DIAGNOSIS — D61818 Other pancytopenia: Secondary | ICD-10-CM | POA: Diagnosis not present

## 2022-02-20 DIAGNOSIS — F32A Depression, unspecified: Secondary | ICD-10-CM | POA: Diagnosis not present

## 2022-02-20 DIAGNOSIS — C9200 Acute myeloblastic leukemia, not having achieved remission: Secondary | ICD-10-CM | POA: Diagnosis not present

## 2022-02-20 DIAGNOSIS — D469 Myelodysplastic syndrome, unspecified: Secondary | ICD-10-CM | POA: Diagnosis not present

## 2022-02-20 DIAGNOSIS — Z79899 Other long term (current) drug therapy: Secondary | ICD-10-CM | POA: Diagnosis not present

## 2022-02-26 ENCOUNTER — Encounter: Payer: Self-pay | Admitting: Family Medicine

## 2022-02-27 MED ORDER — TIZANIDINE HCL 2 MG PO TABS: 2.0000 mg | ORAL_TABLET | Freq: Two times a day (BID) | ORAL | 0 refills | Status: DC | PRN

## 2022-02-27 NOTE — Telephone Encounter (Signed)
Meds ordered this encounter  Medications   tiZANidine (ZANAFLEX) 2 MG tablet    Sig: Take 1 tablet (2 mg total) by mouth 2 (two) times daily as needed for muscle spasms.    Dispense:  20 tablet    Refill:  0

## 2022-02-27 NOTE — Telephone Encounter (Signed)
From what I can tell we had actually taken off her medication list because she reported an intolerance that included diarrhea nausea and anxiety so I think that is when it got removed from her list.  Zanaflex also cause nausea and night sweats and confusion so we had discontinued that 1 as well.  Skelaxin caused cough and shortness of breath so that 1 was discontinued as well.  So at this point I am really not sure that she is a great candidate for muscle relaxers.  I think the neck step would be to refer her to neurology to try to get better control of her headaches that she is not having to use some of those medications.

## 2022-03-01 ENCOUNTER — Other Ambulatory Visit: Payer: Self-pay | Admitting: Family Medicine

## 2022-03-01 DIAGNOSIS — F5101 Primary insomnia: Secondary | ICD-10-CM

## 2022-04-01 ENCOUNTER — Other Ambulatory Visit: Payer: Self-pay | Admitting: Family Medicine

## 2022-04-02 ENCOUNTER — Encounter: Payer: Self-pay | Admitting: Family Medicine

## 2022-04-02 ENCOUNTER — Telehealth (INDEPENDENT_AMBULATORY_CARE_PROVIDER_SITE_OTHER): Payer: Medicare Other | Admitting: Family Medicine

## 2022-04-02 DIAGNOSIS — G43009 Migraine without aura, not intractable, without status migrainosus: Secondary | ICD-10-CM | POA: Diagnosis not present

## 2022-04-02 DIAGNOSIS — F331 Major depressive disorder, recurrent, moderate: Secondary | ICD-10-CM | POA: Diagnosis not present

## 2022-04-02 DIAGNOSIS — D469 Myelodysplastic syndrome, unspecified: Secondary | ICD-10-CM | POA: Diagnosis not present

## 2022-04-02 DIAGNOSIS — D696 Thrombocytopenia, unspecified: Secondary | ICD-10-CM

## 2022-04-02 MED ORDER — DULOXETINE HCL 20 MG PO CPEP: 20.0000 mg | ORAL_CAPSULE | Freq: Every day | ORAL | 1 refills | Status: DC

## 2022-04-02 MED ORDER — SUMATRIPTAN SUCCINATE 50 MG PO TABS: 50.0000 mg | ORAL_TABLET | ORAL | 0 refills | Status: DC | PRN

## 2022-04-02 NOTE — Assessment & Plan Note (Signed)
We discussed options for her migraines.  Change in mood certainly could be contributing I think that possible trigger.  But we also discussed maybe trying another rescue medication such as the Imitrex.  She had tried Maxalt in the past but it made her feel like she drank "10 cups of coffee".  So I think if we start with a low-dose of Imitrex we can at least see if it is helpful and then adjust the dose from there.

## 2022-04-02 NOTE — Progress Notes (Signed)
Pt states that she would like to discuss a few of her medications that she takes with Dr. Madilyn Fireman. (Diazepam, Duloxetine 30 and 60 mg,and Trazodone)

## 2022-04-02 NOTE — Assessment & Plan Note (Signed)
Following with hematology oncology.

## 2022-04-02 NOTE — Assessment & Plan Note (Signed)
Discussed options.  The Cymbalta does come in a 20 mg so working to try writing for 2 of them for a total of 40 mg daily.  Hopefully her insurance will cover this since we cannot split the capsules.  She is otherwise doing well we will plan to see her back in about 8 weeks to make sure that the adjustment was helpful but I did encourage her to reach out if she still feels like she is struggling emotionally.

## 2022-04-02 NOTE — Assessment & Plan Note (Signed)
Followed by hematology oncology not under active treatment at this time still under monitoring.

## 2022-04-02 NOTE — Progress Notes (Signed)
Virtual Visit via Video Note  I connected with Dawn Bailey on 04/02/22 at  1:00 PM EST by a video enabled telemedicine application and verified that I am speaking with the correct person using two identifiers.   I discussed the limitations of evaluation and management by telemedicine and the availability of in person appointments. The patient expressed understanding and agreed to proceed.  Patient location: at home Provider location: in office  Subjective:    CC:   Chief Complaint  Patient presents with   Follow-up         HPI:  Felt her mood was down in early October and felt like couldn't "snap" out of it. Feels like part of it is age and not being able to do some things.  She is also considering moving in with her daughter Dawn Bailey.  She is just a little bit stressed about losing her independence.  She wonders if she could go up just slightly on the Cymbalta.  We had previously tried to go up to 60 mg she did not feel well she felt really anxious and jittery.  She wonders if we could do some dose in between or either adjust her trazodone.  Migraines have been weird as well. Have been lasting 3-4 days.  HA have been starting and ending with a migraine.  She feels like some of it is related to her mood changes since it started all around the same time.  Past medical history, Surgical history, Family history not pertinant except as noted below, Social history, Allergies, and medications have been entered into the medical record, reviewed, and corrections made.    Objective:    General: Speaking clearly in complete sentences without any shortness of breath.  Alert and oriented x3.  Normal judgment. No apparent acute distress.    Impression and Recommendations:    Problem List Items Addressed This Visit       Cardiovascular and Mediastinum   Migraine - Primary (Chronic)    We discussed options for her migraines.  Change in mood certainly could be contributing I think that  possible trigger.  But we also discussed maybe trying another rescue medication such as the Imitrex.  She had tried Maxalt in the past but it made her feel like she drank "10 cups of coffee".  So I think if we start with a low-dose of Imitrex we can at least see if it is helpful and then adjust the dose from there.      Relevant Medications   DULoxetine (CYMBALTA) 20 MG capsule   SUMAtriptan (IMITREX) 50 MG tablet     Hematopoietic and Hemostatic   Thrombocytopenia (Dawn Bailey)    Following with hematology oncology.        Other   Myelodysplastic syndrome (Dawn Bailey)    Followed by hematology oncology not under active treatment at this time still under monitoring.      Major depressive disorder, recurrent episode, moderate (HCC)    Discussed options.  The Cymbalta does come in a 20 mg so working to try writing for 2 of them for a total of 40 mg daily.  Hopefully her insurance will cover this since we cannot split the capsules.  She is otherwise doing well we will plan to see her back in about 8 weeks to make sure that the adjustment was helpful but I did encourage her to reach out if she still feels like she is struggling emotionally.      Relevant Medications  DULoxetine (CYMBALTA) 20 MG capsule    No orders of the defined types were placed in this encounter.   Meds ordered this encounter  Medications   DULoxetine (CYMBALTA) 20 MG capsule    Sig: Take 1 capsule (20 mg total) by mouth daily.    Dispense:  60 capsule    Refill:  1   SUMAtriptan (IMITREX) 50 MG tablet    Sig: Take 1 tablet (50 mg total) by mouth every 2 (two) hours as needed for migraine. May repeat in 2 hours if headache persists or recurs.    Dispense:  5 tablet    Refill:  0   Migraine We discussed options for her migraines.  Change in mood certainly could be contributing I think that possible trigger.  But we also discussed maybe trying another rescue medication such as the Imitrex.  She had tried Maxalt in the past  but it made her feel like she drank "10 cups of coffee".  So I think if we start with a low-dose of Imitrex we can at least see if it is helpful and then adjust the dose from there.  Thrombocytopenia [facility]) Following with hematology oncology.  Major depressive disorder, recurrent episode, moderate Discussed options.  The Cymbalta does come in a 20 mg so working to try writing for 2 of them for a total of 40 mg daily.  Hopefully her insurance will cover this since we cannot split the capsules.  She is otherwise doing well we will plan to see her back in about 8 weeks to make sure that the adjustment was helpful but I did encourage her to reach out if she still feels like she is struggling emotionally.  Myelodysplastic syndrome (Dawn Bailey) Followed by hematology oncology not under active treatment at this time still under monitoring.    I discussed the assessment and treatment plan with the patient. The patient was provided an opportunity to ask questions and all were answered. The patient agreed with the plan and demonstrated an understanding of the instructions.   The patient was advised to call back or seek an in-person evaluation if the symptoms worsen or if the condition fails to improve as anticipated.   Dawn Lecher, MD

## 2022-04-04 ENCOUNTER — Encounter: Payer: Self-pay | Admitting: Family Medicine

## 2022-04-06 MED ORDER — DULOXETINE HCL 20 MG PO CPEP: 40.0000 mg | ORAL_CAPSULE | Freq: Every day | ORAL | 1 refills | Status: DC

## 2022-04-06 NOTE — Telephone Encounter (Signed)
Meds ordered this encounter  Medications   DULoxetine (CYMBALTA) 20 MG capsule    Sig: Take 2 capsules (40 mg total) by mouth daily.    Dispense:  60 capsule    Refill:  1   Corrected the instructions.

## 2022-04-21 ENCOUNTER — Encounter: Payer: Self-pay | Admitting: Family Medicine

## 2022-04-21 NOTE — Telephone Encounter (Signed)
Verify with her if she is using it as needed.  She is making it sound like in general she is struggling with sleep and nightmares.  But is she talking about when she takes the medication only which should be used as needed not every day.  It should only be taken for true migraine headaches.  I know sometimes she also gets tension type headaches.  So we can just clarify that she may be overusing it and that may be part of the problem.

## 2022-04-21 NOTE — Telephone Encounter (Signed)
I would keep it the same.  No changes recommended at this time.

## 2022-04-27 ENCOUNTER — Other Ambulatory Visit: Payer: Self-pay | Admitting: Family Medicine

## 2022-05-18 ENCOUNTER — Encounter: Payer: Self-pay | Admitting: Family Medicine

## 2022-05-18 ENCOUNTER — Telehealth (INDEPENDENT_AMBULATORY_CARE_PROVIDER_SITE_OTHER): Payer: Medicare Other | Admitting: Family Medicine

## 2022-05-18 DIAGNOSIS — J0100 Acute maxillary sinusitis, unspecified: Secondary | ICD-10-CM | POA: Diagnosis not present

## 2022-05-18 DIAGNOSIS — J019 Acute sinusitis, unspecified: Secondary | ICD-10-CM | POA: Insufficient documentation

## 2022-05-18 MED ORDER — DOXYCYCLINE HYCLATE 100 MG PO TABS: 100.0000 mg | ORAL_TABLET | Freq: Two times a day (BID) | ORAL | 0 refills | Status: DC

## 2022-05-18 NOTE — Progress Notes (Signed)
Dawn Bailey - 88 y.o. female MRN [id number]  Date of birth: 1934/07/14   This visit type was conducted due to national recommendations for restrictions regarding the COVID-19 Pandemic (e.g. social distancing).  This format is felt to be most appropriate for this patient at this time.  All issues noted in this document were discussed and addressed.  No physical exam was performed (except for noted visual exam findings with Video Visits).  I discussed the limitations of evaluation and management by telemedicine and the availability of in person appointments. The patient expressed understanding and agreed to proceed.  I connected withNAME@ on 05/18/22 at  1:10 PM EST by a video enabled telemedicine application and verified that I am speaking with the correct person using two identifiers.  Present at visit: Dawn Nutting, DO [name]   Patient Location: Home [place] [address]   Provider location:   [facility]  Chief Complaint  Patient presents with   Sinus Problem    HPI  Dawn Bailey is a 88 y.o. female who presents via audio/video conferencing for a telehealth visit today.  She has complaint of congestion, sinus pain and pressure, nasal drainage, fatigue and low grade fever.  Symptoms started a little over 1 week ago.  She was starting to feel better for a day or two and then started having worsening symptoms.  She has tried OTC medications for cold relief which does help some.  She is drinking a good amount of fluids.  She did not test for COVID throughout duration of this illness.  She denies breathing difficulty or wheezing.    ROS:  A comprehensive ROS was completed and negative except as noted per HPI  Past Medical History:  Diagnosis Date   Allergy    Anemia    Back sprain/strain, thoracic    Depression    Diverticulosis    Gastric ulcer    Hearing loss    Hemorrhoids    Hyperlipidemia    Hypertension    IBS (irritable bowel syndrome)     Iron deficiency anemia due to chronic blood loss 11/03/2019   Lumbar sprain and strain    Migraines    Nevus    atypical   Obesity    Osteopenia    Panic attacks     Past Surgical History:  Procedure Laterality Date   cataract surgery  2004   bilateral    Family History  Problem Relation Age of Onset   Depression Mother    Diabetes Father    Hypertension Father    Stroke Father    Hypertension Brother     Social History   Socioeconomic History   Marital status: Widowed    Spouse name: Not on file   Number of children: 2   Years of education: 15   Highest education level: Associate degree: academic program  Occupational History   Occupation: Press photographer    Comment: retired  Tobacco Use   Smoking status: Former    Types: Cigarettes    Quit date: 05/05/1995    Years since quitting: 27.0   Smokeless tobacco: Never  Vaping Use   Vaping Use: Never used  Substance and Sexual Activity   Alcohol use: Yes    Comment: occasionally   Drug use: No   Sexual activity: Not Currently  Other Topics Concern   Not on file  Social History Narrative   Lives alone. Has two children. Likes to do crossword puzzles, jigsaw  puzzles and read. She has a dog that she likes to take care of.      Social Determinants of Health   Financial Resource Strain: Low Risk  (08/25/2021)   Overall Financial Resource Strain (CARDIA)    Difficulty of Paying Living Expenses: Not hard at all  Food Insecurity: No Food Insecurity (08/25/2021)   Hunger Vital Sign    Worried About Running Out of Food in the Last Year: Never true    Ran Out of Food in the Last Year: Never true  Transportation Needs: No Transportation Needs (08/25/2021)   PRAPARE - Hydrologist (Medical): No    Lack of Transportation (Non-Medical): No  Physical Activity: Inactive (08/25/2021)   Exercise Vital Sign    Days of Exercise per Week: 0 days    Minutes of Exercise per Session: 0 min  Stress: No Stress  Concern Present (08/25/2021)   Banquete    Feeling of Stress : Not at all  Social Connections: Socially Isolated (08/25/2021)   Social Connection and Isolation Panel [NHANES]    Frequency of Communication with Friends and Family: More than three times a week    Frequency of Social Gatherings with Friends and Family: Twice a week    Attends Religious Services: Never    Marine scientist or Organizations: No    Attends Archivist Meetings: Never    Marital Status: Widowed  Intimate Partner Violence: Not At Risk (08/25/2021)   Humiliation, Afraid, Rape, and Kick questionnaire    Fear of Current or Ex-Partner: No    Emotionally Abused: No    Physically Abused: No    Sexually Abused: No     Current Outpatient Medications:    Bacillus Coagulans-Inulin (PROBIOTIC) 1-250 BILLION-MG CAPS, Take 1 capsule by mouth daily., Disp: , Rfl:    diazepam (VALIUM) 2 MG tablet, Take 1 tablet (2 mg total) by mouth daily as needed for anxiety or muscle spasms., Disp: 20 tablet, Rfl: 0   doxycycline (VIBRA-TABS) 100 MG tablet, Take 1 tablet (100 mg total) by mouth 2 (two) times daily., Disp: 20 tablet, Rfl: 0   DULoxetine (CYMBALTA) 20 MG capsule, Take 2 capsules (40 mg total) by mouth daily., Disp: 60 capsule, Rfl: 1   hydrOXYzine (VISTARIL) 25 MG capsule, Take 1 capsule (25 mg total) by mouth every 8 (eight) hours as needed., Disp: 30 capsule, Rfl: 0   loperamide (IMODIUM A-D) 2 MG tablet, Take 2 mg by mouth as needed.  , Disp: , Rfl:    magnesium 30 MG tablet, Take 30 mg by mouth 2 (two) times daily. Takes it once a day., Disp: , Rfl:    melatonin 3 MG TABS tablet, Take 3 mg by mouth at bedtime. As needed., Disp: , Rfl:    nystatin cream (MYCOSTATIN), Apply topically 2 (two) times daily. APPLY TO AFFECTED AREA(S) TOPICALLY TWICE DAILY, Disp: 90 g, Rfl: 2   Omega-3 Fatty Acids (OMEGA 3 PO), Take 1 capsule by mouth daily., Disp: ,  Rfl:    ondansetron (ZOFRAN-ODT) 8 MG disintegrating tablet, Take 1 tablet (8 mg total) by mouth every 8 (eight) hours as needed for nausea or vomiting., Disp: 20 tablet, Rfl: 1   SUMAtriptan (IMITREX) 50 MG tablet, Take 1 tablet (50 mg total) by mouth every 2 (two) hours as needed for migraine. May repeat in 2 hours if headache persists or recurs., Disp: 5 tablet, Rfl: 0  tiZANidine (ZANAFLEX) 2 MG tablet, TAKE ONE TABLET BY MOUTH TWICE DAILY AS NEEDED FOR MUSCLE SPASMS, Disp: 20 tablet, Rfl: 0   traZODone (DESYREL) 100 MG tablet, TAKE ONE AND ONE-HALF TABLETS BY MOUTH AT BEDTIME, Disp: 135 tablet, Rfl: 0   triamcinolone cream (KENALOG) 0.5 %, APPLY 1 APPLICATION  TOPICALLY 2 (TWO) TIMES  DAILY. TO AFFECTED AREAS., Disp: 90 g, Rfl: 1   Turmeric 500 MG CAPS, Take 1 capsule by mouth daily., Disp: , Rfl:   EXAM:  VITALS per patient if applicable: There were no vitals taken for this visit.  GENERAL: alert, oriented, appears well and in no acute distress  HEENT: atraumatic, conjunttiva clear, no obvious abnormalities on inspection of external nose and ears  NECK: normal movements of the head and neck  LUNGS: on inspection no signs of respiratory distress, breathing rate appears normal, no obvious gross SOB, gasping or wheezing  CV: no obvious cyanosis  MS: moves all visible extremities without noticeable abnormality  PSYCH/NEURO: pleasant and cooperative, no obvious depression or anxiety, speech and thought processing grossly intact  ASSESSMENT AND PLAN:  Discussed the following assessment and plan:  Acute sinusitis Sounds like she had recent viral illness initially that started to improve, now with second sickening and worsening symptoms.  Adding course of doxycycline.  Recommend increased fluids, nasal saline rinses and/or humidifier.  She may continue OTC cold medication as needed. Contact clinic if having new/worsening symptoms.      I discussed the assessment and treatment plan  with the patient. The patient was provided an opportunity to ask questions and all were answered. The patient agreed with the plan and demonstrated an understanding of the instructions.   The patient was advised to call back or seek an in-person evaluation if the symptoms worsen or if the condition fails to improve as anticipated.    Dawn Nutting, DO

## 2022-05-18 NOTE — Assessment & Plan Note (Signed)
Sounds like she had recent viral illness initially that started to improve, now with second sickening and worsening symptoms.  Adding course of doxycycline.  Recommend increased fluids, nasal saline rinses and/or humidifier.  She may continue OTC cold medication as needed. Contact clinic if having new/worsening symptoms.

## 2022-05-25 ENCOUNTER — Other Ambulatory Visit: Payer: Self-pay | Admitting: Family Medicine

## 2022-05-25 DIAGNOSIS — F5101 Primary insomnia: Secondary | ICD-10-CM

## 2022-06-05 DIAGNOSIS — K580 Irritable bowel syndrome with diarrhea: Secondary | ICD-10-CM | POA: Diagnosis not present

## 2022-06-05 DIAGNOSIS — Z882 Allergy status to sulfonamides status: Secondary | ICD-10-CM | POA: Diagnosis not present

## 2022-06-05 DIAGNOSIS — Z79899 Other long term (current) drug therapy: Secondary | ICD-10-CM | POA: Diagnosis not present

## 2022-06-05 DIAGNOSIS — D696 Thrombocytopenia, unspecified: Secondary | ICD-10-CM | POA: Diagnosis not present

## 2022-06-05 DIAGNOSIS — F32A Depression, unspecified: Secondary | ICD-10-CM | POA: Diagnosis not present

## 2022-06-05 DIAGNOSIS — D469 Myelodysplastic syndrome, unspecified: Secondary | ICD-10-CM | POA: Diagnosis not present

## 2022-06-05 DIAGNOSIS — D509 Iron deficiency anemia, unspecified: Secondary | ICD-10-CM | POA: Diagnosis not present

## 2022-06-05 DIAGNOSIS — Z888 Allergy status to other drugs, medicaments and biological substances status: Secondary | ICD-10-CM | POA: Diagnosis not present

## 2022-06-05 DIAGNOSIS — Z881 Allergy status to other antibiotic agents status: Secondary | ICD-10-CM | POA: Diagnosis not present

## 2022-06-18 ENCOUNTER — Other Ambulatory Visit: Payer: Self-pay | Admitting: Family Medicine

## 2022-06-22 ENCOUNTER — Telehealth (INDEPENDENT_AMBULATORY_CARE_PROVIDER_SITE_OTHER): Payer: Medicare Other | Admitting: Family Medicine

## 2022-06-22 ENCOUNTER — Encounter: Payer: Self-pay | Admitting: Family Medicine

## 2022-06-22 DIAGNOSIS — F419 Anxiety disorder, unspecified: Secondary | ICD-10-CM

## 2022-06-22 MED ORDER — DIAZEPAM 2 MG PO TABS: 2.0000 mg | ORAL_TABLET | Freq: Every day | ORAL | 0 refills | Status: DC | PRN

## 2022-06-22 NOTE — Progress Notes (Signed)
Dawn Bailey - 88 y.o. female MRN [id number]  Date of birth: 1934/06/13   This visit type was conducted due to national recommendations for restrictions regarding the COVID-19 Pandemic (e.g. social distancing).  This format is felt to be most appropriate for this patient at this time.  All issues noted in this document were discussed and addressed.  No physical exam was performed (except for noted visual exam findings with Video Visits).  I discussed the limitations of evaluation and management by telemedicine and the availability of in person appointments. The patient expressed understanding and agreed to proceed.  I connected withNAME@ on 06/22/22 at 11:30 AM EST by a video enabled telemedicine application and verified that I am speaking with the correct person using two identifiers.  Present at visit: Luetta Nutting, DO [name]   Patient Location: Home  [address] [facility] [address] [place] [address]   Provider location:   [place]  No chief complaint on file.   HPI  Dawn Bailey is a 87 y.o. female who presents via audio/video conferencing for a telehealth visit today.  She is planning on moving in with her daughter soon.  She is having increased anxiety about packing and moving.  Tolerating duloxetine at 20 mg twice daily.  She did try increasing to 30 mg twice daily however had significant side effects at this increased dose.  She has used diazepam in the past for anxiety and would like a refill on this.  She did tolerate this well previously without significant side effects including oversedation.   ROS:  A comprehensive ROS was completed and negative except as noted per HPI  Past Medical History:  Diagnosis Date   Allergy    Anemia    Back sprain/strain, thoracic    Depression    Diverticulosis    Gastric ulcer    Hearing loss    Hemorrhoids    Hyperlipidemia    Hypertension    IBS (irritable bowel syndrome)    Iron deficiency anemia due to chronic blood loss  11/03/2019   Lumbar sprain and strain    Migraines    Nevus    atypical   Obesity    Osteopenia    Panic attacks     Past Surgical History:  Procedure Laterality Date   cataract surgery  2004   bilateral    Family History  Problem Relation Age of Onset   Depression Mother    Diabetes Father    Hypertension Father    Stroke Father    Hypertension Brother     Social History   Socioeconomic History   Marital status: Widowed    Spouse name: Not on file   Number of children: 2   Years of education: 15   Highest education level: Associate degree: academic program  Occupational History   Occupation: Press photographer    Comment: retired  Tobacco Use   Smoking status: Former    Types: Cigarettes    Quit date: 05/05/1995    Years since quitting: 27.1   Smokeless tobacco: Never  Vaping Use   Vaping Use: Never used  Substance and Sexual Activity   Alcohol use: Yes    Comment: occasionally   Drug use: No   Sexual activity: Not Currently  Other Topics Concern   Not on file  Social History Narrative   Lives alone. Has two children. Likes to do crossword puzzles, jigsaw puzzles and read. She has a dog that she likes to take care of.  Social Determinants of Health   Financial Resource Strain: Low Risk  (08/25/2021)   Overall Financial Resource Strain (CARDIA)    Difficulty of Paying Living Expenses: Not hard at all  Food Insecurity: No Food Insecurity (08/25/2021)   Hunger Vital Sign    Worried About Running Out of Food in the Last Year: Never true    Ran Out of Food in the Last Year: Never true  Transportation Needs: No Transportation Needs (08/25/2021)   PRAPARE - Hydrologist (Medical): No    Lack of Transportation (Non-Medical): No  Physical Activity: Inactive (08/25/2021)   Exercise Vital Sign    Days of Exercise per Week: 0 days    Minutes of Exercise per Session: 0 min  Stress: No Stress Concern Present (08/25/2021)   [place]    Feeling of Stress : Not at all  Social Connections: Socially Isolated (08/25/2021)   Social Connection and Isolation Panel [NHANES]    Frequency of Communication with Friends and Family: More than three times a week    Frequency of Social Gatherings with Friends and Family: Twice a week    Attends Religious Services: Never    Marine scientist or Organizations: No    Attends Archivist Meetings: Never    Marital Status: Widowed  Intimate Partner Violence: Not At Risk (08/25/2021)   Humiliation, Afraid, Rape, and Kick questionnaire    Fear of Current or Ex-Partner: No    Emotionally Abused: No    Physically Abused: No    Sexually Abused: No     Current Outpatient Medications:    Bacillus Coagulans-Inulin (PROBIOTIC) 1-250 BILLION-MG CAPS, Take 1 capsule by mouth daily., Disp: , Rfl:    diazepam (VALIUM) 2 MG tablet, Take 1 tablet (2 mg total) by mouth daily as needed for anxiety or muscle spasms., Disp: 20 tablet, Rfl: 0   doxycycline (VIBRA-TABS) 100 MG tablet, Take 1 tablet (100 mg total) by mouth 2 (two) times daily., Disp: 20 tablet, Rfl: 0   DULoxetine (CYMBALTA) 20 MG capsule, TAKE TWO CAPSULES BY MOUTH DAILY, Disp: 60 capsule, Rfl: 0   loperamide (IMODIUM A-D) 2 MG tablet, Take 2 mg by mouth as needed.  , Disp: , Rfl:    magnesium 30 MG tablet, Take 30 mg by mouth 2 (two) times daily. Takes it once a day., Disp: , Rfl:    melatonin 3 MG TABS tablet, Take 3 mg by mouth at bedtime. As needed., Disp: , Rfl:    nystatin cream (MYCOSTATIN), Apply topically 2 (two) times daily. APPLY TO AFFECTED AREA(S) TOPICALLY TWICE DAILY, Disp: 90 g, Rfl: 2   Omega-3 Fatty Acids (OMEGA 3 PO), Take 1 capsule by mouth daily., Disp: , Rfl:    ondansetron (ZOFRAN-ODT) 8 MG disintegrating tablet, Take 1 tablet (8 mg total) by mouth every 8 (eight) hours as needed for nausea or vomiting., Disp: 20 tablet, Rfl: 1   tiZANidine  (ZANAFLEX) 2 MG tablet, TAKE ONE TABLET BY MOUTH TWICE DAILY AS NEEDED FOR MUSCLE SPASMS, Disp: 20 tablet, Rfl: 0   traZODone (DESYREL) 100 MG tablet, TAKE ONE AND ONE-HALF TABLETS BY MOUTH AT BEDTIME, Disp: 135 tablet, Rfl: 0   triamcinolone cream (KENALOG) 0.5 %, APPLY 1 APPLICATION  TOPICALLY 2 (TWO) TIMES  DAILY. TO AFFECTED AREAS., Disp: 90 g, Rfl: 1   Turmeric 500 MG CAPS, Take 1 capsule by mouth daily., Disp: , Rfl:   EXAM:  VITALS per patient if applicable: There were no vitals taken for this visit.  GENERAL: alert, oriented, appears well and in no acute distress  HEENT: atraumatic, conjunttiva clear, no obvious abnormalities on inspection of external nose and ears  NECK: normal movements of the head and neck  LUNGS: on inspection no signs of respiratory distress, breathing rate appears normal, no obvious gross SOB, gasping or wheezing  CV: no obvious cyanosis  MS: moves all visible extremities without noticeable abnormality  PSYCH/NEURO: pleasant and cooperative, no obvious depression or anxiety, speech and thought processing grossly intact  ASSESSMENT AND PLAN:  Discussed the following assessment and plan:  Anxiety Continue duloxetine at current strength.  Diazepam has been helpful in the past and we will add this back on temporarily.  She is aware that she should not take this on a consistent basis and should only reserve for episodes of severe anxiety.     I discussed the assessment and treatment plan with the patient. The patient was provided an opportunity to ask questions and all were answered. The patient agreed with the plan and demonstrated an understanding of the instructions.   The patient was advised to call back or seek an in-person evaluation if the symptoms worsen or if the condition fails to improve as anticipated.    Luetta Nutting, DO

## 2022-06-22 NOTE — Assessment & Plan Note (Signed)
Continue duloxetine at current strength.  Diazepam has been helpful in the past and we will add this back on temporarily.  She is aware that she should not take this on a consistent basis and should only reserve for episodes of severe anxiety.

## 2022-07-23 ENCOUNTER — Other Ambulatory Visit: Payer: Self-pay | Admitting: Family Medicine

## 2022-07-23 NOTE — Telephone Encounter (Signed)
To PCP

## 2022-07-24 ENCOUNTER — Other Ambulatory Visit: Payer: Self-pay | Admitting: Family Medicine

## 2022-07-26 ENCOUNTER — Other Ambulatory Visit: Payer: Self-pay | Admitting: Family Medicine

## 2022-08-16 ENCOUNTER — Encounter: Payer: Self-pay | Admitting: Family Medicine

## 2022-08-17 ENCOUNTER — Other Ambulatory Visit: Payer: Self-pay | Admitting: Family Medicine

## 2022-08-17 MED ORDER — DULOXETINE HCL 30 MG PO CPEP: 30.0000 mg | ORAL_CAPSULE | Freq: Two times a day (BID) | ORAL | 0 refills | Status: DC

## 2022-08-18 ENCOUNTER — Other Ambulatory Visit: Payer: Self-pay | Admitting: Family Medicine

## 2022-08-20 ENCOUNTER — Other Ambulatory Visit: Payer: Self-pay | Admitting: Family Medicine

## 2022-08-20 DIAGNOSIS — F5101 Primary insomnia: Secondary | ICD-10-CM

## 2022-08-24 ENCOUNTER — Other Ambulatory Visit: Payer: Self-pay | Admitting: Family Medicine

## 2022-08-25 ENCOUNTER — Other Ambulatory Visit: Payer: Self-pay | Admitting: Family Medicine

## 2022-08-31 ENCOUNTER — Ambulatory Visit (INDEPENDENT_AMBULATORY_CARE_PROVIDER_SITE_OTHER): Payer: Medicare Other | Admitting: Family Medicine

## 2022-08-31 DIAGNOSIS — Z0000 Encounter for general adult medical examination without abnormal findings: Secondary | ICD-10-CM | POA: Diagnosis not present

## 2022-08-31 NOTE — Patient Instructions (Addendum)
MEDICARE ANNUAL WELLNESS VISIT Health Maintenance Summary and Written Plan of Care  Dawn Bailey ,  Thank you for allowing me to perform your Medicare Annual Wellness Visit and for your ongoing commitment to your health.   Health Maintenance & Immunization History Health Maintenance  Topic Date Due  . COVID-19 Vaccine (6 - 2023-24 season) 09/16/2022 (Originally 06/24/2022)  . Zoster Vaccines- Shingrix (1 of 2) 05/02/2023 (Originally 01/27/1954)  . INFLUENZA VACCINE  12/03/2022  . Medicare Annual Wellness (AWV)  08/31/2023  . DTaP/Tdap/Td (3 - Td or Tdap) 11/14/2023  . Pneumonia Vaccine 60+ Years old  Completed  . DEXA SCAN  Completed  . HPV VACCINES  Aged Out   Immunization History  Administered Date(s) Administered  . Fluad Quad(high Dose 65+) 02/08/2019, 02/08/2020, 01/20/2022  . Influenza Split 02/19/2012  . Influenza Whole 02/04/2006, 03/09/2007, 02/02/2008, 01/24/2010, 01/16/2011  . Influenza, High Dose Seasonal PF 03/16/2016, 03/22/2017, 02/07/2021  . Influenza,inj,Quad PF,6+ Mos 01/09/2013, 01/01/2014, 02/12/2015, 12/28/2017  . Influenza-Unspecified 03/04/2021  . PFIZER(Purple Top)SARS-COV-2 Vaccination 05/15/2019, 06/05/2019, 02/08/2020  . Research officer, trade [employer] 38yrs & up 03/04/2021  . Pneumococcal Conjugate-13 11/13/2013  . Pneumococcal Polysaccharide-23 01/03/2004  . Td 07/03/2003  . Tdap 11/13/2013  . Unspecified SARS-COV-2 Vaccination 04/29/2022    These are the patient goals that we discussed:  Goals Addressed              This Visit's Progress   .  Patient Stated (pt-stated)        Patient stated that she would like to improve her balance.        This is a list of Health Maintenance Items that are overdue or due now: There are no preventive care reminders to display for this patient.    Orders/Referrals Placed Today: No orders of the defined types were placed in this encounter.  (Contact our referral department at [phone] if  you have not spoken with someone about your referral appointment within the next 5 days)    Follow-up Plan Follow-up with Agapito Games, MD as planned Medicare wellness visit in one year.  Patient will access AVS on my chart.      Health Maintenance, Female Adopting a healthy lifestyle and getting preventive care are important in promoting health and wellness. Ask your health care provider about: The right schedule for you to have regular tests and exams. Things you can do on your own to prevent diseases and keep yourself healthy. What should I know about diet, weight, and exercise? Eat a healthy diet  Eat a diet that includes plenty of vegetables, fruits, low-fat dairy products, and lean protein. Do not eat a lot of foods that are high in solid fats, added sugars, or sodium. Maintain a healthy weight Body mass index (BMI) is used to identify weight problems. It estimates body fat based on height and weight. Your health care provider can help determine your BMI and help you achieve or maintain a healthy weight. Get regular exercise Get regular exercise. This is one of the most important things you can do for your health. Most adults should: Exercise for at least 150 minutes each week. The exercise should increase your heart rate and make you sweat (moderate-intensity exercise). Do strengthening exercises at least twice a week. This is in addition to the moderate-intensity exercise. Spend less time sitting. Even light physical activity can be beneficial. Watch cholesterol and blood lipids Have your blood tested for lipids and cholesterol at 86 years of age, then have  this test every 5 years. Have your cholesterol levels checked more often if: Your lipid or cholesterol levels are high. You are older than 86 years of age. You are at high risk for heart disease. What should I know about cancer screening? Depending on your health history and family history, you may need to have  cancer screening at various ages. This may include screening for: Breast cancer. Cervical cancer. Colorectal cancer. Skin cancer. Lung cancer. What should I know about heart disease, diabetes, and high blood pressure? Blood pressure and heart disease High blood pressure causes heart disease and increases the risk of stroke. This is more likely to develop in people who have high blood pressure readings or are overweight. Have your blood pressure checked: Every 3-5 years if you are 91-39 years of age. Every year if you are 3 years old or older. Diabetes Have regular diabetes screenings. This checks your fasting blood sugar level. Have the screening done: Once every three years after age 53 if you are at a normal weight and have a low risk for diabetes. More often and at a younger age if you are overweight or have a high risk for diabetes. What should I know about preventing infection? Hepatitis B If you have a higher risk for hepatitis B, you should be screened for this virus. Talk with your health care provider to find out if you are at risk for hepatitis B infection. Hepatitis C Testing is recommended for: Everyone born from [birth year] through 1963. Anyone with known risk factors for hepatitis C. Sexually transmitted infections (STIs) Get screened for STIs, including gonorrhea and chlamydia, if: You are sexually active and are younger than 86 years of age. You are older than 86 years of age and your health care provider tells you that you are at risk for this type of infection. Your sexual activity has changed since you were last screened, and you are at increased risk for chlamydia or gonorrhea. Ask your health care provider if you are at risk. Ask your health care provider about whether you are at high risk for HIV. Your health care provider may recommend a prescription medicine to help prevent HIV infection. If you choose to take medicine to prevent HIV, you should first get tested for HIV.  You should then be tested every 3 months for as long as you are taking the medicine. Pregnancy If you are about to stop having your period (premenopausal) and you may become pregnant, seek counseling before you get pregnant. Take 400 to 800 micrograms (mcg) of folic acid every day if you become pregnant. Ask for birth control (contraception) if you want to prevent pregnancy. Osteoporosis and menopause Osteoporosis is a disease in which the bones lose minerals and strength with aging. This can result in bone fractures. If you are 33 years old or older, or if you are at risk for osteoporosis and fractures, ask your health care provider if you should: Be screened for bone loss. Take a calcium or vitamin D supplement to lower your risk of fractures. Be given hormone replacement therapy (HRT) to treat symptoms of menopause. Follow these instructions at home: Alcohol use Do not drink alcohol if: Your health care provider tells you not to drink. You are pregnant, may be pregnant, or are planning to become pregnant. If you drink alcohol: Limit how much you have to: 0-1 drink a day. Know how much alcohol is in your drink. In the [place] one drink equals one 12 oz bottle of  beer (355 mL), one 5 oz glass of wine (148 mL), or one 1 oz glass of hard liquor (44 mL). Lifestyle Do not use any products that contain nicotine or tobacco. These products include cigarettes, chewing tobacco, and vaping devices, such as e-cigarettes. If you need help quitting, ask your health care provider. Do not use street drugs. Do not share needles. Ask your health care provider for help if you need support or information about quitting drugs. General instructions Schedule regular health, dental, and eye exams. Stay current with your vaccines. Tell your health care provider if: You often feel depressed. You have ever been abused or do not feel safe at home. Summary Adopting a healthy lifestyle and getting preventive care  are important in promoting health and wellness. Follow your health care provider's instructions about healthy diet, exercising, and getting tested or screened for diseases. Follow your health care provider's instructions on monitoring your cholesterol and blood pressure. This information is not intended to replace advice given to you by your health care provider. Make sure you discuss any questions you have with your health care provider. Document Revised: 09/09/2020 Document Reviewed: 09/09/2020 Elsevier Patient Education  2023 [employer].

## 2022-08-31 NOTE — Progress Notes (Signed)
MEDICARE ANNUAL WELLNESS VISIT  08/31/2022  Telephone Visit Disclaimer This Medicare AWV was conducted by telephone due to national recommendations for restrictions regarding the COVID-19 Pandemic (e.g. social distancing).  I verified, using two identifiers, that I am speaking with Dawn Bailey or their authorized healthcare agent. I discussed the limitations, risks, security, and privacy concerns of performing an evaluation and management service by telephone and the potential availability of an in-person appointment in the future. The patient expressed understanding and agreed to proceed.  Location of Patient: Home Location of Provider (nurse):  In the office.  Subjective:    Dawn Bailey is a 88 y.o. female patient of Metheney, Barbarann Ehlers, MD who had a Medicare Annual Wellness Visit today via telephone. Chameka is Retired and lives with her daughter. she has 2 children. she reports that she is socially active and does interact with friends/family regularly. she is minimally physically active and enjoys crossword puzzles, jigsaw puzzles and read.  Patient Care Team: Agapito Games, MD as PCP - General Gabriel Carina, [facility] as Pharmacist (Pharmacist)     08/31/2022    2:10 PM 08/25/2021    2:08 PM 07/26/2020   11:01 AM 03/19/2020   11:53 AM 01/18/2020   10:47 AM 12/07/2019   10:44 AM 11/02/2019   11:33 AM  Advanced Directives  Does Patient Have a Medical Advance Directive? Yes Yes No No No Yes No  Type of Advance Directive Living will Living will  Living will  Living will   Does patient want to make changes to medical advance directive? No - Patient declined No - Patient declined       Would patient like information on creating a medical advance directive?   No - Patient declined No - Patient declined No - Patient declined No - Patient declined No - Patient declined    Hospital Utilization Over the Past 12 Months: # of hospitalizations or ER visits: 0 # of surgeries:  0  Review of Systems    Patient reports that her overall health is unchanged compared to last year.  History obtained from chart review and the patient  Patient Reported Readings (BP, Pulse, CBG, Weight, etc) none  Pain Assessment Pain : 0-10 Pain Score: 5  Pain Type: Acute pain Pain Location: Teeth Pain Descriptors / Indicators: Aching Pain Onset: In the past 7 days Pain Frequency: Constant     Current Medications & Allergies (verified) Allergies as of 08/31/2022       Reactions   Aripiprazole Other (See Comments)   REACTION: nightmares.   Nsaids Other (See Comments)   Bleeding ulcer   Skelaxin [metaxalone] Other (See Comments)   Cough, SOB   Tolmetin Other (See Comments)   Bleeding ulcer   Maxalt [rizatriptan Benzoate] Other (See Comments)   Made her feels very jittery and stimulated   Cephalexin Nausea And Vomiting   Lipitor [atorvastatin] Other (See Comments)   Muscle pain   Simvastatin Other (See Comments)   Muscle aches/   Sulfa Antibiotics Nausea Only   Sulfonamide Derivatives Nausea Only   Zanaflex [tizanidine Hcl] Nausea Only, Other (See Comments)   Dizziness,night sweats, confusion        Medication List        Accurate as of August 31, 2022  2:15 PM. If you have any questions, ask your nurse or doctor.          diazepam 2 MG tablet Commonly known as: Valium Take 1 tablet (2 mg  total) by mouth daily as needed for anxiety or muscle spasms.   DULoxetine 30 MG capsule Commonly known as: CYMBALTA Take 1 capsule (30 mg total) by mouth 2 (two) times daily.   loperamide 2 MG tablet Commonly known as: IMODIUM A-D Take 2 mg by mouth as needed.   magnesium 30 MG tablet Take 30 mg by mouth 2 (two) times daily. Takes it once a day.   melatonin 3 MG Tabs tablet Take 3 mg by mouth at bedtime. As needed.   nystatin cream Commonly known as: MYCOSTATIN Apply topically 2 (two) times daily. APPLY TO AFFECTED AREA(S) TOPICALLY TWICE DAILY   OMEGA 3  PO Take 1 capsule by mouth daily.   ondansetron 8 MG disintegrating tablet Commonly known as: ZOFRAN-ODT Take 1 tablet (8 mg total) by mouth every 8 (eight) hours as needed for nausea or vomiting.   Probiotic 1-250 BILLION-MG Caps Take 1 capsule by mouth daily.   tiZANidine 2 MG tablet Commonly known as: ZANAFLEX TAKE ONE TABLET BY MOUTH TWICE DAILY AS NEEDED FOR MUSCLE SPASMS   traZODone 100 MG tablet Commonly known as: DESYREL TAKE ONE AND ONE HALF TABLETS BY MOUTH AT BEDTIME   triamcinolone cream 0.5 % Commonly known as: KENALOG APPLY 1 APPLICATION  TOPICALLY 2 (TWO) TIMES  DAILY. TO AFFECTED AREAS.   Turmeric 500 MG Caps Take 1 capsule by mouth daily.        History (reviewed): Past Medical History:  Diagnosis Date   Allergy    Anemia    Back sprain/strain, thoracic    Depression    Diverticulosis    Gastric ulcer    Hearing loss    Hemorrhoids    Hyperlipidemia    Hypertension    IBS (irritable bowel syndrome)    Iron deficiency anemia due to chronic blood loss 11/03/2019   Lumbar sprain and strain    Migraines    Nevus    atypical   Obesity    Osteopenia    Panic attacks    Past Surgical History:  Procedure Laterality Date   cataract surgery  2004   bilateral   Family History  Problem Relation Age of Onset   Depression Mother    Diabetes Father    Hypertension Father    Stroke Father    Hypertension Brother    Social History   Socioeconomic History   Marital status: Widowed    Spouse name: Not on file   Number of children: 2   Years of education: 15   Highest education level: Associate degree: academic program  Occupational History   Occupation: Airline pilot    Comment: retired  Tobacco Use   Smoking status: Former    Types: Cigarettes    Quit date: 05/05/1995    Years since quitting: 27.3   Smokeless tobacco: Never  Vaping Use   Vaping Use: Never used  Substance and Sexual Activity   Alcohol use: Yes    Comment: occasionally   Drug  use: No   Sexual activity: Not Currently  Other Topics Concern   Not on file  Social History Narrative   Lives with her daughter. Has two children. Likes to do crossword puzzles, jigsaw puzzles and read. She has a dog that she likes to take care of.      Social Determinants of Health   Financial Resource Strain: Low Risk  (08/31/2022)   Overall Financial Resource Strain (CARDIA)    Difficulty of Paying Living Expenses: Not hard at all  Food Insecurity: No Food Insecurity (08/31/2022)   Hunger Vital Sign    Worried About Running Out of Food in the Last Year: Never true    Ran Out of Food in the Last Year: Never true  Transportation Needs: No Transportation Needs (08/31/2022)   PRAPARE - Administrator, Civil Service (Medical): No    Lack of Transportation (Non-Medical): No  Physical Activity: Inactive (08/25/2021)   Exercise Vital Sign    Days of Exercise per Week: 0 days    Minutes of Exercise per Session: 0 min  Stress: Stress Concern Present (08/31/2022)   [employer] of Occupational Health - Occupational Stress Questionnaire    Feeling of Stress : To some extent  Social Connections: Socially Isolated (08/31/2022)   Social Connection and Isolation Panel [NHANES]    Frequency of Communication with Friends and Family: Three times a week    Frequency of Social Gatherings with Friends and Family: More than three times a week    Attends Religious Services: Never    Database administrator or Organizations: No    Attends Banker Meetings: Never    Marital Status: Widowed    Activities of Daily Living     No data to display          Patient Education/ Literacy How often do you need to have someone help you when you read instructions, pamphlets, or other written materials from your doctor or pharmacy?: 1 - Never What is the last grade level you completed in school?: 1 year of college  Exercise    Diet Patient reports consuming 3 meals a day and 2  snack(s) a day Patient reports that her primary diet is: Regular Patient reports that she does have regular access to food.   Depression Screen    08/31/2022    2:10 PM 05/18/2022    1:06 PM 04/02/2022   12:10 PM 01/20/2022    3:10 PM 09/18/2021   10:38 AM 08/25/2021    2:09 PM 05/19/2021    2:57 PM  PHQ 2/9 Scores  PHQ - 2 Score 0 2 3 0 0 0 0  PHQ- 9 Score   13  4       Fall Risk    08/31/2022    2:10 PM 05/18/2022    1:08 PM 01/20/2022    3:09 PM 08/25/2021    2:09 PM 07/08/2021   11:04 AM  Fall Risk   Falls in the past year? 0 0 0 0 0  Number falls in past yr: 0 0 0 0 0  Injury with Fall? 0 0 0 0 0  Risk for fall due to : No Fall Risks No Fall Risks No Fall Risks No Fall Risks No Fall Risks  Follow up Falls evaluation completed Falls evaluation completed Falls evaluation completed Falls evaluation completed Falls prevention discussed;Falls evaluation completed     Objective:  DAMYIA STRIDER seemed alert and oriented and she participated appropriately during our telephone visit.  Blood Pressure Weight BMI  BP Readings from Last 3 Encounters:  01/20/22 (!) 152/68  09/18/21 131/62  07/08/21 (!) 129/59   Wt Readings from Last 3 Encounters:  01/20/22 167 lb (75.8 kg)  09/18/21 171 lb (77.6 kg)  07/08/21 177 lb (80.3 kg)   BMI Readings from Last 1 Encounters:  01/20/22 26.95 kg/m    *Unable to obtain current vital signs, weight, and BMI due to telephone visit type  Hearing/Vision  Azizah did not  seem to have difficulty with hearing/understanding during the telephone conversation Reports that she has not had a formal eye exam by an eye care professional within the past year Reports that she has not had a formal hearing evaluation within the past year *Unable to fully assess hearing and vision during telephone visit type  Cognitive Function:    08/25/2021    2:15 PM 07/26/2020   11:09 AM 06/19/2019   11:14 AM 05/10/2018   11:09 AM  6CIT Screen  What Year? 0 points 0  points 0 points 0 points  What month? 0 points 0 points 0 points 0 points  What time? 0 points 0 points 0 points 0 points  Count back from 20 0 points 0 points 0 points 2 points  Months in reverse 0 points 0 points 0 points 0 points  Repeat phrase 2 points 0 points 2 points 0 points  Total Score 2 points 0 points 2 points 2 points   (Normal:0-7, Significant for Dysfunction: >8)  Normal Cognitive Function Screening: Yes   Immunization & Health Maintenance Record Immunization History  Administered Date(s) Administered   Fluad Quad(high Dose 65+) 02/08/2019, 02/08/2020, 01/20/2022   Influenza Split 02/19/2012   Influenza Whole 02/04/2006, 03/09/2007, 02/02/2008, 01/24/2010, 01/16/2011   Influenza, High Dose Seasonal PF 03/16/2016, 03/22/2017, 02/07/2021   Influenza,inj,Quad PF,6+ Mos 01/09/2013, 01/01/2014, 02/12/2015, 12/28/2017   Influenza-Unspecified 03/04/2021   PFIZER(Purple Top)SARS-COV-2 Vaccination 05/15/2019, 06/05/2019, 02/08/2020   Pfizer Covid-19 Vaccine Bivalent Booster 43yrs & up 03/04/2021   Pneumococcal Conjugate-13 11/13/2013   Pneumococcal Polysaccharide-23 01/03/2004   Td 07/03/2003   Tdap 11/13/2013   Unspecified SARS-COV-2 Vaccination 04/29/2022    Health Maintenance  Topic Date Due   COVID-19 Vaccine (6 - 2023-24 season) 06/24/2022   Medicare Annual Wellness (AWV)  08/26/2022   Zoster Vaccines- Shingrix (1 of 2) 05/02/2023 (Originally 01/27/1954)   INFLUENZA VACCINE  12/03/2022   DTaP/Tdap/Td (3 - Td or Tdap) 11/14/2023   Pneumonia Vaccine 37+ Years old  Completed   DEXA SCAN  Completed   HPV VACCINES  Aged Out       Assessment  This is a routine wellness examination for Amarrah F Corro.  Health Maintenance: Due or Overdue Health Maintenance Due  Topic Date Due   COVID-19 Vaccine (6 - 2023-24 season) 06/24/2022   Medicare Annual Wellness (AWV)  08/26/2022    Dawn Bailey does not need a referral for Community Assistance: Care  Management:   no Social Work:    no Prescription Assistance:  no Nutrition/Diabetes Education:  no   Plan:  Personalized Goals  Goals Addressed               This Visit's Progress     Patient Stated (pt-stated)        Patient stated that she would like to improve her balance.       Personalized Health Maintenance & Screening Recommendations  Bone densitometry screening Shingles vaccine  Patient declined the shingles vaccine and bone density scan.  Lung Cancer Screening Recommended: no (Low Dose CT Chest recommended if Age 56-80 years, 30 pack-year currently smoking OR have quit w/in past 15 years) Hepatitis C Screening recommended: no HIV Screening recommended: no  Advanced Directives: Written information was not prepared per patient's request.  Referrals & Orders No orders of the defined types were placed in this encounter.   Follow-up Plan Follow-up with Agapito Games, MD as planned Medicare wellness visit in one year.  Patient will access AVS on my  chart.   I have personally reviewed and noted the following in the patient's chart:   Medical and social history Use of alcohol, tobacco or illicit drugs  Current medications and supplements Functional ability and status Nutritional status Physical activity Advanced directives List of other physicians Hospitalizations, surgeries, and ER visits in previous 12 months Vitals Screenings to include cognitive, depression, and falls Referrals and appointments  In addition, I have reviewed and discussed with Dawn Bailey certain preventive protocols, quality metrics, and best practice recommendations. A written personalized care plan for preventive services as well as general preventive health recommendations is available and can be mailed to the patient at her request.      Modesto Charon, RN BSN  08/31/2022

## 2022-09-03 ENCOUNTER — Ambulatory Visit (INDEPENDENT_AMBULATORY_CARE_PROVIDER_SITE_OTHER): Payer: Medicare Other | Admitting: Family Medicine

## 2022-09-03 VITALS — BP 123/61 | HR 89 | Wt 173.0 lb

## 2022-09-03 DIAGNOSIS — F419 Anxiety disorder, unspecified: Secondary | ICD-10-CM | POA: Diagnosis not present

## 2022-09-03 DIAGNOSIS — J019 Acute sinusitis, unspecified: Secondary | ICD-10-CM

## 2022-09-03 DIAGNOSIS — R21 Rash and other nonspecific skin eruption: Secondary | ICD-10-CM | POA: Diagnosis not present

## 2022-09-03 MED ORDER — HYDROXYZINE PAMOATE 25 MG PO CAPS: 25.0000 mg | ORAL_CAPSULE | Freq: Every day | ORAL | 0 refills | Status: DC | PRN

## 2022-09-03 MED ORDER — CLINDAMYCIN PHOSPHATE 1 % EX GEL
Freq: Two times a day (BID) | CUTANEOUS | 1 refills | Status: AC | PRN
Start: 2022-09-03 — End: ?

## 2022-09-03 MED ORDER — AZITHROMYCIN 250 MG PO TABS: ORAL_TABLET | ORAL | 0 refills | Status: AC

## 2022-09-03 MED ORDER — METRONIDAZOLE 1 % EX GEL
Freq: Every day | CUTANEOUS | 2 refills | Status: AC
Start: 2022-09-03 — End: ?

## 2022-09-03 NOTE — Assessment & Plan Note (Signed)
Discontinue diazepam and switch to hydroxyzine as needed.  She has taken it before and did well with it.

## 2022-09-03 NOTE — Progress Notes (Signed)
Acute Office Visit  Subjective:     Patient ID: Dawn Bailey, female    DOB: 1935/02/06, 88 y.o.   MRN: [id number]  Chief Complaint  Patient presents with   Sinusitis         HPI Patient is in today for sinus symptoms.  She says it really started right after the holidays it has been going on for 4 months.  current she says the symptoms were initially mild and so she ended up seeing Dr. Ashley Royalty in January for sinusitis.  She was treated with an antibiotic and says she felt better for about 1 to 2 weeks and the symptoms gradually came back.  She said they have been more intense over the last couple of weeks.  She actually just got started on amoxicillin 500 mg for dental work last Friday as she had a root canal yesterday.  She supposed to continue with the antibiotics for 3 more days after today but is still having a lot of facial pressure and congestion in her maxillary sinuses.  No fevers or chills.  She feels like she still has a lot of drainage.  In regards to her anxiety she did try diazepam per another provider but really did not like the way it makes her feel so wanted to go back to using Atarax as needed.  Also for the rash on her face she did use the over-the-counter benzyl peroxide but it really has not helped and would like to try something else if possible.  She moved in with her daughter Dawn Bailey since I last saw her.  ROS      Objective:    BP 123/61   Pulse 89   Wt 173 lb (78.5 kg)   SpO2 96%   BMI 27.92 kg/m    Physical Exam Vitals and nursing note reviewed.  Constitutional:      Appearance: She is well-developed.  HENT:     Head: Normocephalic and atraumatic.  Cardiovascular:     Rate and Rhythm: Normal rate and regular rhythm.     Heart sounds: Normal heart sounds.  Pulmonary:     Effort: Pulmonary effort is normal.     Breath sounds: Normal breath sounds.  Skin:    General: Skin is warm and dry.  Neurological:     Mental Status: She is alert and  oriented to person, place, and time.  Psychiatric:        Behavior: Behavior normal.     No results found for any visits on 09/03/22.      Assessment & Plan:   Problem List Items Addressed This Visit       Respiratory   Acute sinusitis - Primary   Relevant Medications   azithromycin (ZITHROMAX) 250 MG tablet     Other   Anxiety    Discontinue diazepam and switch to hydroxyzine as needed.  She has taken it before and did well with it.      Relevant Medications   hydrOXYzine (VISTARIL) 25 MG capsule   Other Visit Diagnoses     Rash       Relevant Medications   metroNIDAZOLE (METROGEL) 1 % gel   clindamycin (CLINDAGEL) 1 % gel       Cystitis-since she has to complete the amoxicillin we will just add azithromycin for now.  If not improving or symptoms are resolving then she can let us know next week.  Rash-originally was, treat with metronidazole gel but it looks very expensive on  her insurance so we send over clindamycin instead.    Meds ordered this encounter  Medications   azithromycin (ZITHROMAX) 250 MG tablet    Sig: 2 Ttabs PO on Day 1, then one a day x 4 days.    Dispense:  6 tablet    Refill:  0   metroNIDAZOLE (METROGEL) 1 % gel    Sig: Apply topically daily. To area on face    Dispense:  60 g    Refill:  2   clindamycin (CLINDAGEL) 1 % gel    Sig: Apply topically 2 (two) times daily as needed.    Dispense:  60 g    Refill:  1    OK to sub if cheaper than metrogel   hydrOXYzine (VISTARIL) 25 MG capsule    Sig: Take 1 capsule (25 mg total) by mouth daily as needed. For anxiety    Dispense:  90 capsule    Refill:  0    Return if symptoms worsen or fail to improve.  Nani Gasser, MD

## 2022-09-04 NOTE — Addendum Note (Signed)
Addended by: Nani Gasser D on: 09/04/2022 12:55 PM   Modules accepted: Level of Service

## 2022-09-11 DIAGNOSIS — D469 Myelodysplastic syndrome, unspecified: Secondary | ICD-10-CM | POA: Diagnosis not present

## 2022-09-11 DIAGNOSIS — D509 Iron deficiency anemia, unspecified: Secondary | ICD-10-CM | POA: Diagnosis not present

## 2022-09-15 ENCOUNTER — Other Ambulatory Visit: Payer: Self-pay | Admitting: Family Medicine

## 2022-10-15 ENCOUNTER — Other Ambulatory Visit: Payer: Self-pay | Admitting: Family Medicine

## 2022-10-22 DIAGNOSIS — D469 Myelodysplastic syndrome, unspecified: Secondary | ICD-10-CM | POA: Diagnosis not present

## 2022-10-22 DIAGNOSIS — D509 Iron deficiency anemia, unspecified: Secondary | ICD-10-CM | POA: Diagnosis not present

## 2022-11-07 ENCOUNTER — Other Ambulatory Visit: Payer: Self-pay | Admitting: Family Medicine

## 2022-11-07 DIAGNOSIS — F5101 Primary insomnia: Secondary | ICD-10-CM

## 2022-12-01 ENCOUNTER — Other Ambulatory Visit: Payer: Self-pay | Admitting: Family Medicine

## 2022-12-18 DIAGNOSIS — D469 Myelodysplastic syndrome, unspecified: Secondary | ICD-10-CM | POA: Diagnosis not present

## 2022-12-18 DIAGNOSIS — G4700 Insomnia, unspecified: Secondary | ICD-10-CM | POA: Diagnosis not present

## 2022-12-18 DIAGNOSIS — L989 Disorder of the skin and subcutaneous tissue, unspecified: Secondary | ICD-10-CM | POA: Diagnosis not present

## 2022-12-18 DIAGNOSIS — D509 Iron deficiency anemia, unspecified: Secondary | ICD-10-CM | POA: Diagnosis not present

## 2022-12-18 DIAGNOSIS — R718 Other abnormality of red blood cells: Secondary | ICD-10-CM | POA: Diagnosis not present

## 2022-12-22 ENCOUNTER — Other Ambulatory Visit: Payer: Self-pay | Admitting: Family Medicine

## 2022-12-28 ENCOUNTER — Encounter: Payer: Self-pay | Admitting: Family Medicine

## 2022-12-28 ENCOUNTER — Ambulatory Visit (INDEPENDENT_AMBULATORY_CARE_PROVIDER_SITE_OTHER): Payer: Medicare Other | Admitting: Family Medicine

## 2022-12-28 VITALS — BP 128/47 | HR 85 | Ht 66.0 in | Wt 181.0 lb

## 2022-12-28 DIAGNOSIS — L02214 Cutaneous abscess of groin: Secondary | ICD-10-CM | POA: Diagnosis not present

## 2022-12-28 DIAGNOSIS — Z23 Encounter for immunization: Secondary | ICD-10-CM

## 2022-12-28 NOTE — Patient Instructions (Signed)
Ok to pull about an inch of the dressing each day starting tomorrow.  Keep covered with gauze.  Call if any concerns Cotinue the doxycycline

## 2022-12-28 NOTE — Progress Notes (Signed)
   Acute Office Visit  Subjective:     Patient ID: Dawn Bailey, female    DOB: November 28, 1934, 88 y.o.   MRN: [id number]  Chief Complaint  Patient presents with   Recurrent Skin Infections    X 2 wks R groin. She has been doing warm compressions, cleaning the area and using bandages to cover. She reports that the area started to ooze and become painful.     HPI Patient is in today for boil that has been present for 2 weeks on right anterior labia.  Had small amt of drainage. Washing with antibacterial soap and tried to warm compresses and other home remedies. Not improving.   ROS      Objective:    BP (!) 128/47   Pulse 85   Ht 5\' 6"  (1.676 m)   Wt 181 lb (82.1 kg)   SpO2 99%   BMI 29.21 kg/m     Physical Exam Skin:    Comments: Right anterior labia with purple colored firm abscess approx 3 x 2.5 cm     No results found for any visits on 12/28/22.      Assessment & Plan:   Problem List Items Addressed This Visit   None Visit Diagnoses     Abscess of groin, right    -  Primary   Relevant Orders   Wound culture   Encounter for immunization       Relevant Orders   Flu Vaccine Trivalent High Dose (Fluad) (Completed)      Discussed treatment options.  I& D performed and culture obtained. She has already started doxycycline. Please continue follow up wound care discussed.Ok to pull about an inch of the dressing each day starting tomorrow.  Keep covered with gauze.  Call if any concerns  Incision and Drainage Procedure Note  Pre-operative Diagnosis: abscess  Post-operative Diagnosis: same  Indications: pain and infection  Anesthesia: 1% plain lidocaine  Procedure Details  The procedure, risks and complications have been discussed in detail (including, but not limited to airway compromise, infection, bleeding) with the patient, and the patient has signed consent to the procedure.  The skin was sterilely prepped and draped over the affected area in the  usual fashion. After adequate local anesthesia, I&D with a #11 blade was performed on the right labia. Purulent drainage: present.  Wound probbed with sterile swab to detect any loculations.  The patient was observed until stable.  Findings: Await culture.   EBL: 1 cc's  Drains: 1/4 inch gauze strip placed into wound to improve drainage.   Condition: Tolerated procedure well   Complications: none.    No orders of the defined types were placed in this encounter.   No follow-ups on file.  Nani Gasser, MD

## 2022-12-30 ENCOUNTER — Encounter: Payer: Self-pay | Admitting: Family Medicine

## 2022-12-30 MED ORDER — DOXYCYCLINE HYCLATE 100 MG PO TABS: 100.0000 mg | ORAL_TABLET | Freq: Two times a day (BID) | ORAL | 0 refills | Status: DC

## 2022-12-30 NOTE — Progress Notes (Signed)
Call patient: I am going to send over an antibiotic.  Wound is showing some scant bacteria.

## 2022-12-30 NOTE — Addendum Note (Signed)
Addended by: Nani Gasser D on: 12/30/2022 01:02 PM   Modules accepted: Orders

## 2022-12-31 ENCOUNTER — Encounter: Payer: Self-pay | Admitting: Family Medicine

## 2022-12-31 LAB — WOUND CULTURE: Organism ID, Bacteria: NONE SEEN

## 2023-01-01 MED ORDER — AMOXICILLIN-POT CLAVULANATE 875-125 MG PO TABS: 1.0000 | ORAL_TABLET | Freq: Two times a day (BID) | ORAL | 0 refills | Status: DC

## 2023-01-01 NOTE — Addendum Note (Signed)
Addended by: Nani Gasser D on: 01/01/2023 08:30 AM   Modules accepted: Orders

## 2023-01-01 NOTE — Telephone Encounter (Signed)
A;ready addressed on result note

## 2023-01-01 NOTE — Progress Notes (Signed)
Dawn Bailey, final culture report came back showing staph bacteria is not methicillin-resistant which is great.  Would like to switch you to moxifloxacin.  Would like to send over a new antibiotic for you.  You would also discontinue the doxycycline.  I do not have penicillin products listed for you as an intolerance or allergy.  Orders Placed This Encounter     DISCONTD: doxycycline (VIBRA-TABS) 100 MG tablet         Sig: Take 1 tablet (100 mg total) by mouth 2 (two) times daily.         Dispense:  14 tablet         Refill:  0     amoxicillin-clavulanate (AUGMENTIN) 875-125 MG tablet         Sig: Take 1 tablet by mouth 2 (two) times daily.         Dispense:  14 tablet         Refill:  0

## 2023-01-12 ENCOUNTER — Other Ambulatory Visit: Payer: Self-pay | Admitting: Family Medicine

## 2023-01-19 ENCOUNTER — Other Ambulatory Visit: Payer: Self-pay | Admitting: Family Medicine

## 2023-01-21 MED ORDER — TIZANIDINE HCL 2 MG PO TABS: 2.0000 mg | ORAL_TABLET | Freq: Every day | ORAL | 2 refills | Status: DC | PRN

## 2023-01-21 NOTE — Telephone Encounter (Signed)
Patient informed. 

## 2023-01-21 NOTE — Telephone Encounter (Signed)
Pt called. She is following up on refill of Tizanidine.

## 2023-02-04 ENCOUNTER — Other Ambulatory Visit: Payer: Self-pay | Admitting: Family Medicine

## 2023-02-04 DIAGNOSIS — F5101 Primary insomnia: Secondary | ICD-10-CM

## 2023-03-12 ENCOUNTER — Encounter: Payer: Self-pay | Admitting: Family Medicine

## 2023-03-19 DIAGNOSIS — D509 Iron deficiency anemia, unspecified: Secondary | ICD-10-CM | POA: Diagnosis not present

## 2023-03-19 DIAGNOSIS — D469 Myelodysplastic syndrome, unspecified: Secondary | ICD-10-CM | POA: Diagnosis not present

## 2023-04-05 DIAGNOSIS — F3341 Major depressive disorder, recurrent, in partial remission: Secondary | ICD-10-CM | POA: Insufficient documentation

## 2023-04-05 DIAGNOSIS — D469 Myelodysplastic syndrome, unspecified: Secondary | ICD-10-CM | POA: Diagnosis not present

## 2023-04-05 DIAGNOSIS — G43009 Migraine without aura, not intractable, without status migrainosus: Secondary | ICD-10-CM | POA: Diagnosis not present

## 2023-04-05 DIAGNOSIS — K589 Irritable bowel syndrome without diarrhea: Secondary | ICD-10-CM | POA: Diagnosis not present

## 2023-04-05 DIAGNOSIS — F5101 Primary insomnia: Secondary | ICD-10-CM | POA: Diagnosis not present

## 2023-04-05 DIAGNOSIS — F514 Sleep terrors [night terrors]: Secondary | ICD-10-CM | POA: Diagnosis not present

## 2023-04-05 DIAGNOSIS — D509 Iron deficiency anemia, unspecified: Secondary | ICD-10-CM | POA: Diagnosis not present

## 2023-04-20 ENCOUNTER — Other Ambulatory Visit: Payer: Self-pay | Admitting: Family Medicine

## 2023-04-29 ENCOUNTER — Other Ambulatory Visit: Payer: Self-pay | Admitting: Family Medicine

## 2023-04-29 DIAGNOSIS — F5101 Primary insomnia: Secondary | ICD-10-CM

## 2023-05-19 ENCOUNTER — Other Ambulatory Visit: Payer: Self-pay | Admitting: Family Medicine

## 2023-05-25 ENCOUNTER — Ambulatory Visit: Payer: Medicare Other | Admitting: Family Medicine

## 2023-05-25 DIAGNOSIS — M5441 Lumbago with sciatica, right side: Secondary | ICD-10-CM | POA: Diagnosis not present

## 2023-05-25 DIAGNOSIS — M4316 Spondylolisthesis, lumbar region: Secondary | ICD-10-CM | POA: Diagnosis not present

## 2023-06-01 ENCOUNTER — Ambulatory Visit: Payer: Self-pay | Admitting: Family Medicine

## 2023-06-01 NOTE — Telephone Encounter (Signed)
Requesting rx rf of tizanidine 2mg   Last written 1 /16/25  Patient staets she is out of medication. Last OV 12/28/2022 Upcoming appt 09/06/2023 AWV  Patient was advised to to go to ER by CRM message attached

## 2023-06-01 NOTE — Addendum Note (Signed)
Addended by: Elizabeth Palau on: 06/01/2023 03:10 PM   Modules accepted: Orders

## 2023-06-01 NOTE — Telephone Encounter (Signed)
Copied from CRM [id number]. Topic: Clinical - Red Word Triage >> Jun 01, 2023  8:07 AM Gildardo Pounds wrote: Red Word that prompted transfer to Nurse Triage: right hip pain, ED visit on 05/25/2023 at [facility] in [place], said possibly sciatic nerve, getting worse going down leg. was above knee, now down to ankle. callback number is  [phone]    Chief Complaint: R hip pain Symptoms: Back pain and R hip pain going all the way down to ankle, nausea  Frequency: Ongoing since Jan. 21 Pertinent Negatives: Patient denies fever Disposition: [x] ED /[] Urgent Care (no appt availability in office) / [] Appointment(In office/virtual)/ []  [facility]/ [] Home Care/ [] Refused Recommended Disposition /[] [facility]/ []  Follow-up with PCP  Additional Notes: Patient was in the ED on 1/21 for sciatica of right side. Patient called today and stated she is having worsening pain in her right hip down to her ankle.  She also has back pain. She stated her pain level is 10/10 currently. She is still able to walk, but it hurts to walk. She is using Zanaflex and Voltaren which only helps for a little while. No appointment with PCP until 1/30. Advised patient to return to ED due to severe, worsening pain. Patient stated she is having a car problem, but she can try to get a ride. She is asking if Dr. Linford Arnold can call in a refill of Zanaflex in the meantime to her [facility] because she is almost out.  Reason for Disposition  [1] SEVERE pain (e.g., excruciating, unable to do any normal activities) AND [2] not improved after 2 hours of pain medicine  Answer Assessment - Initial Assessment Questions 1. LOCATION and RADIATION: "Where is the pain located?"      R hip down to ankle and back  2. QUALITY: "What does the pain feel like?"  (e.g., sharp, dull, aching, burning)     Throbbing  3. SEVERITY: "How bad is the pain?" "What does it keep you from doing?"   (Scale 1-10; or mild, moderate, severe)    -  MILD (1-3): doesn't interfere with normal activities    -  MODERATE (4-7): interferes with normal activities (e.g., work or school) or awakens from sleep, limping    -  SEVERE (8-10): excruciating pain, unable to do any normal activities, unable to walk     10/10  4. ONSET: "When did the pain start?" "Does it come and go, or is it there all the time?"     1/21  5. CAUSE: "What do you think is causing the hip pain?"      Sciatic nerve  6. OTHER SYMPTOMS: "Do you have any other symptoms?" (e.g., back pain, pain shooting down leg,  fever, rash)     Nausea  Protocols used: Hip Pain-A-AH

## 2023-06-02 MED ORDER — TIZANIDINE HCL 2 MG PO TABS: 2.0000 mg | ORAL_TABLET | Freq: Three times a day (TID) | ORAL | 0 refills | Status: DC | PRN

## 2023-06-02 NOTE — Telephone Encounter (Signed)
Meds ordered this encounter  Medications   tiZANidine (ZANAFLEX) 2 MG tablet    Sig: Take 1 tablet (2 mg total) by mouth every 8 (eight) hours as needed for muscle spasms. TAKE ONE TABLET BY MOUTH ONE TIME DAILY AS NEEDED FOR MUSCLE SPASMS (THIS IS A 30 DAY SUPPLY)    Dispense:  20 tablet    Refill:  0    OK to fill 05/23/2023

## 2023-06-02 NOTE — Telephone Encounter (Signed)
Left message advising prescription sent to pharmacy.

## 2023-06-02 NOTE — Addendum Note (Signed)
Addended by: Nani Gasser D on: 06/02/2023 07:34 AM   Modules accepted: Orders

## 2023-06-03 ENCOUNTER — Other Ambulatory Visit: Payer: Self-pay | Admitting: *Deleted

## 2023-06-03 MED ORDER — TIZANIDINE HCL 2 MG PO TABS: ORAL_TABLET | ORAL | 0 refills | Status: DC

## 2023-06-04 ENCOUNTER — Telehealth: Payer: Self-pay | Admitting: Family Medicine

## 2023-06-04 NOTE — Telephone Encounter (Signed)
Copied from CRM [id number]. Topic: Clinical - Prescription Issue >> Jun 04, 2023  1:43 PM Carlatta H wrote: Reason for CRM: Patient when to pick up tiZANidine (ZANAFLEX) 2 MG tablet [[id number]] from the pharmacy but it was not ready because the pharmacy was not clear on directions for medication//Please contact pharmacy.. [phone]

## 2023-06-07 NOTE — Addendum Note (Signed)
Addended by: Nani Gasser D on: 06/07/2023 04:40 PM   Modules accepted: Orders

## 2023-06-07 NOTE — Telephone Encounter (Signed)
Call them back to see what the clarification is?  Don't know what their question is?

## 2023-06-16 DIAGNOSIS — B029 Zoster without complications: Secondary | ICD-10-CM | POA: Insufficient documentation

## 2023-06-18 DIAGNOSIS — B029 Zoster without complications: Secondary | ICD-10-CM | POA: Diagnosis not present

## 2023-06-18 DIAGNOSIS — D469 Myelodysplastic syndrome, unspecified: Secondary | ICD-10-CM | POA: Diagnosis not present

## 2023-06-18 DIAGNOSIS — D509 Iron deficiency anemia, unspecified: Secondary | ICD-10-CM | POA: Diagnosis not present

## 2023-06-24 ENCOUNTER — Ambulatory Visit: Payer: Medicare Other | Admitting: Family Medicine

## 2023-06-28 ENCOUNTER — Encounter: Payer: Self-pay | Admitting: Family Medicine

## 2023-06-28 ENCOUNTER — Ambulatory Visit (INDEPENDENT_AMBULATORY_CARE_PROVIDER_SITE_OTHER): Payer: Medicare Other | Admitting: Family Medicine

## 2023-06-28 VITALS — BP 127/65 | HR 94 | Ht 66.0 in | Wt 188.0 lb

## 2023-06-28 DIAGNOSIS — B029 Zoster without complications: Secondary | ICD-10-CM

## 2023-06-28 DIAGNOSIS — I10 Essential (primary) hypertension: Secondary | ICD-10-CM

## 2023-06-28 DIAGNOSIS — F419 Anxiety disorder, unspecified: Secondary | ICD-10-CM

## 2023-06-28 DIAGNOSIS — G43009 Migraine without aura, not intractable, without status migrainosus: Secondary | ICD-10-CM | POA: Diagnosis not present

## 2023-06-28 DIAGNOSIS — F5101 Primary insomnia: Secondary | ICD-10-CM | POA: Diagnosis not present

## 2023-06-28 DIAGNOSIS — F4310 Post-traumatic stress disorder, unspecified: Secondary | ICD-10-CM

## 2023-06-28 DIAGNOSIS — D469 Myelodysplastic syndrome, unspecified: Secondary | ICD-10-CM

## 2023-06-28 DIAGNOSIS — F331 Major depressive disorder, recurrent, moderate: Secondary | ICD-10-CM | POA: Diagnosis not present

## 2023-06-28 MED ORDER — GABAPENTIN 300 MG PO CAPS: 300.0000 mg | ORAL_CAPSULE | Freq: Three times a day (TID) | ORAL | 1 refills | Status: DC

## 2023-06-28 NOTE — Progress Notes (Signed)
   Established Patient Office Visit  Subjective  Patient ID: AHJANAE CASSEL, female    DOB: [birth year]  Age: 89 y.o. MRN: [id number]  Chief Complaint  Patient presents with   Anxiety    HPI Recently broke out in painful burning rash on her right shoulder, post and anterior. Saw her heme/once and they felt it was shingles. She says she was dx with shingles in that same area about 25 yr ago.  She is taking the gabapentin with no problems or excess sedation but isn't getting enough pain control.     ROS    Objective:     BP 127/65   Pulse 94   Ht 5\' 6"  (1.676 m)   Wt 188 lb (85.3 kg)   SpO2 95%   BMI 30.34 kg/m    Physical Exam Vitals and nursing note reviewed.  Constitutional:      Appearance: Normal appearance.  HENT:     Head: Normocephalic and atraumatic.  Eyes:     Conjunctiva/sclera: Conjunctivae normal.  Cardiovascular:     Rate and Rhythm: Normal rate and regular rhythm.  Pulmonary:     Effort: Pulmonary effort is normal.     Breath sounds: Normal breath sounds.  Skin:    General: Skin is warm and dry.  Neurological:     Mental Status: She is alert.  Psychiatric:        Mood and Affect: Mood normal.      No results found for any visits on 06/28/23.    The ASCVD Risk score (Arnett DK, et al., 2019) failed to calculate for the following reasons:   The 2019 ASCVD risk score is only valid for ages 50 to 25    Assessment & Plan:   Problem List Items Addressed This Visit       Cardiovascular and Mediastinum   Migraine (Chronic)   Use muscle relaxer PRN. 20 tabs per month.       Relevant Medications   gabapentin (NEURONTIN) 300 MG capsule   HYPERTENSION, BENIGN SYSTEMIC - Primary (Chronic)   Well controlled. Continue current regimen. Follow up in  55mo         Other   Posttraumatic stress disorder   Uses vistaril PRN      Myelodysplastic syndrome (HCC)   Followed by Oncology at [facility]. They continue to monitor.       Relevant  Medications   valACYclovir (VALTREX) 1000 MG tablet   Major depressive disorder, recurrent episode, moderate (HCC)   Continue cymbalta 60mg  QD      Insomnia   Trazodone for sleep.  She also uses melatonin      Anxiety   Doing well overall off of benzos. Uses vistaril PRN.  Anticolinergic is not optimal based on age but I feel is safer than benzos. Some inc anxiety lately with shingles. Will keep an eye on levels as she is feeling better.   Continue Cymbalta 60mg  QD      Other Visit Diagnoses       Herpes zoster without complication       Relevant Medications   valACYclovir (VALTREX) 1000 MG tablet   gabapentin (NEURONTIN) 300 MG capsule      Shingles - will inc gaba since needs better pain control. We discussed monitoring for sedation.    Return in about 4 months (around 10/26/2023) for Hypertension.    Nani Gasser, MD

## 2023-06-28 NOTE — Assessment & Plan Note (Signed)
 Well controlled. Continue current regimen. Follow up in  6 mo

## 2023-06-28 NOTE — Assessment & Plan Note (Signed)
 Uses vistaril PRN

## 2023-06-28 NOTE — Assessment & Plan Note (Signed)
 Followed by Oncology at [facility]. They continue to monitor.

## 2023-06-28 NOTE — Assessment & Plan Note (Addendum)
 Use muscle relaxer PRN. 20 tabs per month.

## 2023-06-28 NOTE — Assessment & Plan Note (Signed)
 Continue cymbalta 60mg  QD

## 2023-06-28 NOTE — Assessment & Plan Note (Signed)
 Trazodone for sleep.  She also uses melatonin

## 2023-06-28 NOTE — Assessment & Plan Note (Signed)
 Doing well overall off of benzos. Uses vistaril PRN.  Anticolinergic is not optimal based on age but I feel is safer than benzos. Some inc anxiety lately with shingles. Will keep an eye on levels as she is feeling better.   Continue Cymbalta 60mg  QD

## 2023-07-07 ENCOUNTER — Ambulatory Visit: Payer: Medicare Other | Admitting: Family Medicine

## 2023-07-11 ENCOUNTER — Other Ambulatory Visit: Payer: Self-pay | Admitting: Family Medicine

## 2023-07-12 MED ORDER — TIZANIDINE HCL 2 MG PO TABS: ORAL_TABLET | ORAL | 0 refills | Status: DC

## 2023-07-14 ENCOUNTER — Other Ambulatory Visit: Payer: Self-pay | Admitting: *Deleted

## 2023-07-14 MED ORDER — TIZANIDINE HCL 2 MG PO TABS: ORAL_TABLET | ORAL | 0 refills | Status: DC

## 2023-07-24 ENCOUNTER — Other Ambulatory Visit: Payer: Self-pay | Admitting: Family Medicine

## 2023-07-26 ENCOUNTER — Other Ambulatory Visit: Payer: Self-pay | Admitting: Family Medicine

## 2023-07-26 ENCOUNTER — Encounter: Payer: Self-pay | Admitting: Family Medicine

## 2023-07-26 MED ORDER — TIZANIDINE HCL 2 MG PO TABS: ORAL_TABLET | ORAL | 0 refills | Status: DC

## 2023-07-26 NOTE — Telephone Encounter (Signed)
 Patient informed that prescription was refilled today and not denied. She will check with pharmacy regarding this.

## 2023-08-05 ENCOUNTER — Other Ambulatory Visit: Payer: Self-pay | Admitting: Family Medicine

## 2023-08-05 DIAGNOSIS — F5101 Primary insomnia: Secondary | ICD-10-CM

## 2023-08-18 ENCOUNTER — Other Ambulatory Visit: Payer: Self-pay | Admitting: Family Medicine

## 2023-08-18 MED ORDER — TIZANIDINE HCL 2 MG PO TABS: ORAL_TABLET | ORAL | 1 refills | Status: DC

## 2023-08-18 NOTE — Telephone Encounter (Signed)
 Requesting rx rf of Tizanidine  Last written 03/24/225 Last OV 06/28/2023 Upcoming appt 09/09/2023

## 2023-08-30 ENCOUNTER — Ambulatory Visit (INDEPENDENT_AMBULATORY_CARE_PROVIDER_SITE_OTHER): Admitting: Family Medicine

## 2023-08-30 ENCOUNTER — Encounter: Payer: Self-pay | Admitting: Family Medicine

## 2023-08-30 VITALS — BP 144/79 | HR 80 | Ht 66.0 in | Wt 180.0 lb

## 2023-08-30 DIAGNOSIS — H6123 Impacted cerumen, bilateral: Secondary | ICD-10-CM | POA: Diagnosis not present

## 2023-08-30 DIAGNOSIS — H6120 Impacted cerumen, unspecified ear: Secondary | ICD-10-CM | POA: Insufficient documentation

## 2023-08-30 NOTE — Assessment & Plan Note (Signed)
 Ear lavage completed in clinic today.  She tolerated this well.  Follow-up as needed.

## 2023-08-30 NOTE — Progress Notes (Signed)
 Dawn Bailey - 89 y.o. female MRN [id number]  Date of birth: [birth year]  Subjective Chief Complaint  Patient presents with   Cerumen Impaction    HPI Dawn Bailey is a 89 y.o. female here today due to cerumen impaction.  Seen by audiology and noted to have cerumen impaction.  Told to see PCP for ear lavage.  She denies pain or dizziness.   ROS:  A comprehensive ROS was completed and negative except as noted per HPI  Allergies  Allergen Reactions   Aripiprazole Other (See Comments)    REACTION: nightmares.   Nsaids Other (See Comments)    Bleeding ulcer   Skelaxin  [Metaxalone ] Other (See Comments)    Cough, SOB   Tolmetin Other (See Comments)    Bleeding ulcer   Maxalt  [Rizatriptan  Benzoate] Other (See Comments)    Made her feels very jittery and stimulated   Cephalexin Nausea And Vomiting   Lipitor [Atorvastatin ] Other (See Comments)    Muscle pain   Simvastatin  Other (See Comments)    Muscle aches/    Sulfa Antibiotics Nausea Only   Sulfonamide Derivatives Nausea Only    Past Medical History:  Diagnosis Date   Allergy    Anemia    Back sprain/strain, thoracic    Depression    Diverticulosis    Gastric ulcer    Hearing loss    Hemorrhoids    Hyperlipidemia    Hypertension    IBS (irritable bowel syndrome)    Iron  deficiency anemia due to chronic blood loss 11/03/2019   Lumbar sprain and strain    Migraines    Nevus    atypical   Obesity    Osteopenia    Panic attacks     Past Surgical History:  Procedure Laterality Date   cataract surgery  2004   bilateral    Social History   Socioeconomic History   Marital status: Widowed    Spouse name: Not on file   Number of children: 2   Years of education: 15   Highest education level: Associate degree: occupational, Scientist, product/process development, or vocational program  Occupational History   Occupation: Airline pilot    Comment: retired  Tobacco Use   Smoking status: Former    Current packs/day: 0.00    Types: Cigarettes     Quit date: 05/05/1995    Years since quitting: 28.3   Smokeless tobacco: Never  Vaping Use   Vaping status: Never Used  Substance and Sexual Activity   Alcohol use: Yes    Comment: occasionally   Drug use: No   Sexual activity: Not Currently  Other Topics Concern   Not on file  Social History Narrative   Lives with her daughter. Has two children. Likes to do crossword puzzles, jigsaw puzzles and read. She has a dog that she likes to take care of.      Social Drivers of Corporate investment banker Strain: Low Risk  (06/26/2023)   Overall Financial Resource Strain (CARDIA)    Difficulty of Paying Living Expenses: Not hard at all  Food Insecurity: No Food Insecurity (06/26/2023)   Hunger Vital Sign    Worried About Running Out of Food in the Last Year: Never true    Ran Out of Food in the Last Year: Never true  Transportation Needs: No Transportation Needs (06/26/2023)   PRAPARE - Administrator, Civil Service (Medical): No    Lack of Transportation (Non-Medical): No  Physical Activity: Inactive (06/26/2023)  Exercise Vital Sign    Days of Exercise per Week: 0 days    Minutes of Exercise per Session: 0 min  Stress: Stress Concern Present (06/26/2023)   [employer] of Occupational Health - Occupational Stress Questionnaire    Feeling of Stress : Very much  Social Connections: Unknown (06/26/2023)   Social Connection and Isolation Panel [NHANES]    Frequency of Communication with Friends and Family: Once a week    Frequency of Social Gatherings with Friends and Family: Patient declined    Attends Religious Services: Never    Database administrator or Organizations: No    Attends Banker Meetings: Never    Marital Status: Widowed    Family History  Problem Relation Age of Onset   Depression Mother    Diabetes Father    Hypertension Father    Stroke Father    Hypertension Brother     Health Maintenance  Topic Date Due   Zoster Vaccines-  Shingrix (1 of 2) Never done   COVID-19 Vaccine (6 - 2024-25 season) 01/03/2023   Medicare Annual Wellness (AWV)  08/31/2023   DTaP/Tdap/Td (3 - Td or Tdap) 11/14/2023   INFLUENZA VACCINE  12/03/2023   Pneumonia Vaccine 45+ Years old  Completed   DEXA SCAN  Completed   HPV VACCINES  Aged Out   Meningococcal B Vaccine  Aged Out     ----------------------------------------------------------------------------------------------------------------------------------------------------------------------------------------------------------------- Physical Exam BP (!) 144/79 (BP Location: Right Arm, Patient Position: Sitting, Cuff Size: Normal)   Pulse 80   Ht 5\' 6"  (1.676 m)   Wt 180 lb (81.6 kg)   SpO2 96%   BMI 29.05 kg/m   Physical Exam Constitutional:      Appearance: Normal appearance.  HENT:     Head: Normocephalic and atraumatic.     Ears:     Comments: Bilateral cerumen impaction noted.  Reexamined after lavage with normal canal and TM. Neurological:     Mental Status: She is alert.     ------------------------------------------------------------------------------------------------------------------------------------------------------------------------------------------------------------------- Assessment and Plan  Cerumen impaction Ear lavage completed in clinic today.  She tolerated this well.  Follow-up as needed.   No orders of the defined types were placed in this encounter.   No follow-ups on file.

## 2023-09-09 ENCOUNTER — Ambulatory Visit

## 2023-09-09 VITALS — Ht 66.0 in | Wt 175.0 lb

## 2023-09-09 DIAGNOSIS — Z0000 Encounter for general adult medical examination without abnormal findings: Secondary | ICD-10-CM | POA: Diagnosis not present

## 2023-09-09 NOTE — Patient Instructions (Signed)
 [name] , Thank you for taking time to come for your Medicare Wellness Visit. I appreciate your ongoing commitment to your health goals. Please review the following plan we discussed and let me know if I can assist you in the future.   These are the goals we discussed:  Goals       Medication Management      Patient Goals/Self-Care Activities Over the next 180 days, patient will:  take medications as prescribed  Follow Up Plan: Telephone follow up appointment with care management team member scheduled for:  6-8 months       Patient Stated      Patient stated would like to get out and walk more and be more confident in the distance that she can walk comfortably.      Patient Stated (pt-stated)      07/26/2020 AWV Goal: Exercise for General Health  Patient will verbalize understanding of the benefits of increased physical activity: Exercising regularly is important. It will improve your overall fitness, flexibility, and endurance. Regular exercise also will improve your overall health. It can help you control your weight, reduce stress, and improve your bone density. Over the next year, patient will increase physical activity as tolerated with a goal of at least 150 minutes of moderate physical activity per week.  You can tell that you are exercising at a moderate intensity if your heart starts beating faster and you start breathing faster but can still hold a conversation. Moderate-intensity exercise ideas include: Walking 1 mile (1.6 km) in about 15 minutes Biking Hiking Golfing Dancing Water aerobics Patient will verbalize understanding of everyday activities that increase physical activity by providing examples like the following: Yard work, such as: Insurance underwriter Gardening Washing windows or floors Patient will be able to explain general safety guidelines for exercising:  Before you  start a new exercise program, talk with your health care provider. Do not exercise so much that you hurt yourself, feel dizzy, or get very short of breath. Wear comfortable clothes and wear shoes with good support. Drink plenty of water while you exercise to prevent dehydration or heat stroke. Work out until your breathing and your heartbeat get faster.       Patient Stated (pt-stated)      08/25/2021 AWV Goal: Exercise for General Health  Patient will verbalize understanding of the benefits of increased physical activity: Exercising regularly is important. It will improve your overall fitness, flexibility, and endurance. Regular exercise also will improve your overall health. It can help you control your weight, reduce stress, and improve your bone density. Over the next year, patient will increase physical activity as tolerated with a goal of at least 150 minutes of moderate physical activity per week.  You can tell that you are exercising at a moderate intensity if your heart starts beating faster and you start breathing faster but can still hold a conversation. Moderate-intensity exercise ideas include: Walking 1 mile (1.6 km) in about 15 minutes Biking Hiking Golfing Dancing Water aerobics Patient will verbalize understanding of everyday activities that increase physical activity by providing examples like the following: Yard work, such as: Insurance underwriter Gardening Washing windows or floors Patient will be able to explain general safety guidelines for exercising:  Before you start a new exercise program, talk with your health care provider. Do  not exercise so much that you hurt yourself, feel dizzy, or get very short of breath. Wear comfortable clothes and wear shoes with good support. Drink plenty of water while you exercise to prevent dehydration or heat stroke. Work out until your breathing  and your heartbeat get faster.       Patient Stated (pt-stated)      Patient stated that she would like to improve her balance.      Patient Stated      Patient stated she would like to lose weight.         This is a list of the screening recommended for you and due dates:  Health Maintenance  Topic Date Due   Zoster (Shingles) Vaccine (1 of 2) Never done   COVID-19 Vaccine (6 - 2024-25 season) 01/03/2023   DTaP/Tdap/Td vaccine (3 - Td or Tdap) 11/14/2023   Flu Shot  12/03/2023   Medicare Annual Wellness Visit  09/08/2024   Pneumonia Vaccine  Completed   DEXA scan (bone density measurement)  Completed   HPV Vaccine  Aged Out   Meningitis B Vaccine  Aged Out

## 2023-09-09 NOTE — Progress Notes (Signed)
 Subjective:   Dawn Bailey is a 89 y.o. female who presents for Medicare Annual (Subsequent) preventive examination.  Visit Complete: Virtual I connected with  Dawn Bailey on 09/09/23 by a audio enabled telemedicine application and verified that I am speaking with the correct person using two identifiers.  Patient Location: Home  Provider Location: Office/Clinic  I discussed the limitations of evaluation and management by telemedicine. The patient expressed understanding and agreed to proceed.  Vital Signs: Because this visit was a virtual/telehealth visit, some criteria may be missing or patient reported. Any vitals not documented were not able to be obtained and vitals that have been documented are patient reported.  Patient Medicare AWV questionnaire was completed by the patient on n/a; I have confirmed that all information answered by patient is correct and no changes since this date.  Cardiac Risk Factors include: advanced age (>86men, >50 women);smoking/ tobacco exposure;hypertension;dyslipidemia     Objective:    Today's Vitals   09/09/23 1451 09/09/23 1453  Weight: 175 lb (79.4 kg)   Height: 5\' 6"  (1.676 m)   PainSc:  3    Body mass index is 28.25 kg/m.     09/09/2023    3:14 PM 08/31/2022    2:10 PM 08/25/2021    2:08 PM 07/26/2020   11:01 AM 03/19/2020   11:53 AM 01/18/2020   10:47 AM 12/07/2019   10:44 AM  Advanced Directives  Does Patient Have a Medical Advance Directive? Yes Yes Yes No No No Yes  Type of Estate agent of [place];Living will Living will Living will  Living will  Living will  Does patient want to make changes to medical advance directive? No - Patient declined No - Patient declined No - Patient declined      Copy of Healthcare Power of Attorney in Chart? No - copy requested        Would patient like information on creating a medical advance directive?    No - Patient declined No - Patient declined No - Patient declined No -  Patient declined    Current Medications (verified) Outpatient Encounter Medications as of 09/09/2023  Medication Sig   Bacillus Coagulans-Inulin (PROBIOTIC) 1-250 BILLION-MG CAPS Take 1 capsule by mouth daily.   clindamycin  (CLINDAGEL) 1 % gel Apply topically 2 (two) times daily as needed.   clonazePAM  (KLONOPIN ) 0.5 MG tablet Take 0.5 mg by mouth.   DULoxetine  (CYMBALTA ) 30 MG capsule TAKE ONE CAPSULE BY MOUTH TWICE DAILY   hydrOXYzine  (VISTARIL ) 25 MG capsule TAKE ONE CAPSULE BY MOUTH DAILY AS NEEDED FOR ANXIETY   loperamide (IMODIUM A-D) 2 MG tablet Take 2 mg by mouth as needed.     magnesium 30 MG tablet Take 30 mg by mouth 2 (two) times daily. Takes it once a day.   melatonin 3 MG TABS tablet Take 3 mg by mouth at bedtime. As needed.   metroNIDAZOLE  (METROGEL ) 1 % gel Apply topically daily. To area on face   ondansetron  (ZOFRAN -ODT) 8 MG disintegrating tablet Take 1 tablet (8 mg total) by mouth every 8 (eight) hours as needed for nausea or vomiting.   tiZANidine  (ZANAFLEX ) 2 MG tablet TAKE ONE TABLET BY MOUTH ONE TIME DAILY AS NEEDED FOR MUSCLE SPASMS (THIS IS A 30 DAY SUPPLY)   traZODone  (DESYREL ) 100 MG tablet TAKE ONE AND ONE-HALF TABLETS BY MOUTH AT BEDTIME   Turmeric 500 MG CAPS Take 1 capsule by mouth daily.   Omega-3 Fatty Acids (OMEGA 3 PO) Take  1 capsule by mouth daily. (Patient not taking: Reported on 09/09/2023)   [DISCONTINUED] gabapentin  (NEURONTIN ) 300 MG capsule Take 1 capsule (300 mg total) by mouth 3 (three) times daily.   [DISCONTINUED] valACYclovir (VALTREX) 1000 MG tablet Take 1,000 mg by mouth 3 (three) times daily.   No facility-administered encounter medications on file as of 09/09/2023.    Allergies (verified) Aripiprazole, Nsaids, Skelaxin  [metaxalone ], Tolmetin, Maxalt  [rizatriptan  benzoate], Cephalexin, Lipitor [atorvastatin ], Simvastatin , Sulfa antibiotics, and Sulfonamide derivatives   History: Past Medical History:  Diagnosis Date   Allergy    Anemia     Back sprain/strain, thoracic    Depression    Diverticulosis    Gastric ulcer    Hearing loss    Hemorrhoids    Hyperlipidemia    Hypertension    IBS (irritable bowel syndrome)    Iron  deficiency anemia due to chronic blood loss 11/03/2019   Lumbar sprain and strain    Migraines    Nevus    atypical   Obesity    Osteopenia    Panic attacks    Past Surgical History:  Procedure Laterality Date   cataract surgery  2004   bilateral   Family History  Problem Relation Age of Onset   Depression Mother    Diabetes Father    Hypertension Father    Stroke Father    Hypertension Brother    Social History   Socioeconomic History   Marital status: Widowed    Spouse name: Not on file   Number of children: 2   Years of education: 15   Highest education level: Associate degree: occupational, Scientist, product/process development, or vocational program  Occupational History   Occupation: Airline pilot    Comment: retired  Tobacco Use   Smoking status: Former    Current packs/day: 0.00    Types: Cigarettes    Quit date: 05/05/1995    Years since quitting: 28.3   Smokeless tobacco: Never  Vaping Use   Vaping status: Never Used  Substance and Sexual Activity   Alcohol use: Yes    Comment: occasionally   Drug use: No   Sexual activity: Not Currently  Other Topics Concern   Not on file  Social History Narrative   Lives with her daughter. Has two children. Likes to do crossword puzzles and read. She has a dog that she likes to take care of.      Social Drivers of Corporate investment banker Strain: Low Risk  (09/09/2023)   Overall Financial Resource Strain (CARDIA)    Difficulty of Paying Living Expenses: Not hard at all  Food Insecurity: No Food Insecurity (09/09/2023)   Hunger Vital Sign    Worried About Running Out of Food in the Last Year: Never true    Ran Out of Food in the Last Year: Never true  Transportation Needs: No Transportation Needs (09/09/2023)   PRAPARE - Administrator, Civil Service  (Medical): No    Lack of Transportation (Non-Medical): No  Physical Activity: Inactive (09/09/2023)   Exercise Vital Sign    Days of Exercise per Week: 0 days    Minutes of Exercise per Session: 0 min  Stress: Stress Concern Present (09/09/2023)   [employer] of Occupational Health - Occupational Stress Questionnaire    Feeling of Stress : Very much  Social Connections: Socially Isolated (09/09/2023)   Social Connection and Isolation Panel [NHANES]    Frequency of Communication with Friends and Family: Twice a week    Frequency of  Social Gatherings with Friends and Family: More than three times a week    Attends Religious Services: Never    Database administrator or Organizations: No    Attends Banker Meetings: Never    Marital Status: Widowed    Tobacco Counseling Counseling given: Not Answered   Clinical Intake:  Pre-visit preparation completed: Yes  Pain : 0-10 Pain Score: 3  Pain Type: Chronic pain Pain Location: Head Pain Descriptors / Indicators: Aching Pain Onset: More than a month ago Pain Frequency: Intermittent Pain Relieving Factors: Tylenol   Pain Relieving Factors: Tylenol   BMI - recorded: 28.25 Nutritional Status: BMI 25 -29 Overweight Nutritional Risks: None Diabetes: No  How often do you need to have someone help you when you read instructions, pamphlets, or other written materials from your doctor or pharmacy?: 1 - Never What is the last grade level you completed in school?: 15  Interpreter Needed?: No      Activities of Daily Living    09/09/2023    2:55 PM  In your present state of health, do you have any difficulty performing the following activities:  Hearing? 1  Vision? 0  Difficulty concentrating or making decisions? 1  Walking or climbing stairs? 1  Dressing or bathing? 0  Doing errands, shopping? 0  Preparing Food and eating ? N  Using the Toilet? N  In the past six months, have you accidently leaked urine? Y  Do you  have problems with loss of bowel control? N  Managing your Medications? N  Managing your Finances? N  Housekeeping or managing your Housekeeping? N    Patient Care Team: Cydney Draft, MD as PCP - General Adelaida Adie, MD as Referring Physician (Internal Medicine)  Indicate any recent Medical Services you may have received from other than [facility] providers in the past year (date may be approximate).     Assessment:   This is a routine wellness examination for Dawn Bailey.  Hearing/Vision screen No results found.   Goals Addressed             This Visit's Progress    Patient Stated       Patient stated she would like to lose weight.       Depression Screen    09/09/2023    3:06 PM 06/28/2023   11:53 AM 08/31/2022    2:10 PM 05/18/2022    1:06 PM 04/02/2022   12:10 PM 01/20/2022    3:10 PM 09/18/2021   10:38 AM  PHQ 2/9 Scores  PHQ - 2 Score 4 5 0 2 3 0 0  PHQ- 9 Score 8 17   13  4     Fall Risk    09/09/2023    3:14 PM 06/28/2023   11:53 AM 12/28/2022    8:05 AM 08/31/2022    2:10 PM 05/18/2022    1:08 PM  Fall Risk   Falls in the past year? 0 0 0 0 0  Number falls in past yr: 0 0 0 0 0  Injury with Fall? 0 0 0 0 0  Risk for fall due to : Impaired balance/gait No Fall Risks;Impaired balance/gait No Fall Risks No Fall Risks No Fall Risks  Follow up Falls evaluation completed Falls evaluation completed Falls evaluation completed Falls evaluation completed Falls evaluation completed    MEDICARE RISK AT HOME: Medicare Risk at Home Any stairs in or around the home?: Yes If so, are there any without handrails?: No Home free of  loose throw rugs in walkways, pet beds, electrical cords, etc?: Yes Adequate lighting in your home to reduce risk of falls?: Yes Life alert?: Yes Use of a cane, walker or w/c?: Yes Grab bars in the bathroom?: No Shower chair or bench in shower?: Yes Elevated toilet seat or a handicapped toilet?: Yes  TIMED UP AND GO:  Was the test  performed?  No    Cognitive Function:        09/09/2023    3:16 PM 08/31/2022    2:16 PM 08/25/2021    2:15 PM 07/26/2020   11:09 AM 06/19/2019   11:14 AM  6CIT Screen  What Year? 0 points 0 points 0 points 0 points 0 points  What month? 0 points 0 points 0 points 0 points 0 points  What time? 0 points 0 points 0 points 0 points 0 points  Count back from 20 0 points 0 points 0 points 0 points 0 points  Months in reverse 0 points 0 points 0 points 0 points 0 points  Repeat phrase 2 points 0 points 2 points 0 points 2 points  Total Score 2 points 0 points 2 points 0 points 2 points    Immunizations Immunization History  Administered Date(s) Administered   Fluad Quad(high Dose 65+) 02/08/2019, 02/08/2020, 01/20/2022   Fluad Trivalent(High Dose 65+) 12/28/2022   Influenza Split 02/19/2012   Influenza Whole 02/04/2006, 03/09/2007, 02/02/2008, 01/24/2010, 01/16/2011   Influenza, High Dose Seasonal PF 03/16/2016, 03/22/2017, 02/07/2021   Influenza,inj,Quad PF,6+ Mos 01/09/2013, 01/01/2014, 02/12/2015, 12/28/2017   Influenza-Unspecified 03/04/2021   PFIZER(Purple Top)SARS-COV-2 Vaccination 05/15/2019, 06/05/2019, 02/08/2020   Pfizer Covid-19 Vaccine Bivalent Booster 59yrs & up 03/04/2021   Pneumococcal Conjugate-13 11/13/2013   Pneumococcal Polysaccharide-23 01/03/2004   Td 07/03/2003   Tdap 11/13/2013   Unspecified SARS-COV-2 Vaccination 04/29/2022    TDAP status: Due, Education has been provided regarding the importance of this vaccine. Advised may receive this vaccine at local pharmacy or Health Dept. Aware to provide a copy of the vaccination record if obtained from local pharmacy or Health Dept. Verbalized acceptance and understanding.  Flu Vaccine status: Up to date  Pneumococcal vaccine status: Up to date  Covid-19 vaccine status: Information provided on how to obtain vaccines.   Qualifies for Shingles Vaccine? Yes   Zostavax completed No   Shingrix Completed?: No.     Education has been provided regarding the importance of this vaccine. Patient has been advised to call insurance company to determine out of pocket expense if they have not yet received this vaccine. Advised may also receive vaccine at local pharmacy or Health Dept. Verbalized acceptance and understanding.  Screening Tests Health Maintenance  Topic Date Due   Zoster Vaccines- Shingrix (1 of 2) Never done   COVID-19 Vaccine (6 - 2024-25 season) 01/03/2023   DTaP/Tdap/Td (3 - Td or Tdap) 11/14/2023   INFLUENZA VACCINE  12/03/2023   Medicare Annual Wellness (AWV)  09/08/2024   Pneumonia Vaccine 43+ Years old  Completed   DEXA SCAN  Completed   HPV VACCINES  Aged Out   Meningococcal B Vaccine  Aged Out    Health Maintenance  Health Maintenance Due  Topic Date Due   Zoster Vaccines- Shingrix (1 of 2) Never done   COVID-19 Vaccine (6 - 2024-25 season) 01/03/2023    Colorectal cancer screening: No longer required.   Mammogram status: No longer required due to age.  Bone Density status: Completed 09/26/2008. Results reflect: Bone density results: NORMAL. Repeat every 2 years.  Lung Cancer Screening: (Low Dose CT Chest recommended if Age 16-80 years, 20 pack-year currently smoking OR have quit w/in 15years.) does not qualify.   Lung Cancer Screening Referral: n/a  Additional Screening:  Hepatitis C Screening: does not qualify; Completed   Vision Screening: Recommended annual ophthalmology exams for early detection of glaucoma and other disorders of the eye. Is the patient up to date with their annual eye exam?  No  Who is the provider or what is the name of the office in which the patient attends annual eye exams? In [place] If pt is not established with a provider, would they like to be referred to a provider to establish care? No .   Dental Screening: Recommended annual dental exams for proper oral hygiene   Community Resource Referral / Chronic Care Management: CRR  required this visit?  No   CCM required this visit?  No     Plan:     I have personally reviewed and noted the following in the patient's chart:   Medical and social history Use of alcohol, tobacco or illicit drugs  Current medications and supplements including opioid prescriptions. Patient is not currently taking opioid prescriptions. Functional ability and status Nutritional status Physical activity Advanced directives List of other physicians Hospitalizations # 0, surgeries # 0, and ER # 1 visits in previous 12 months Vitals Screenings to include cognitive, depression, and falls Referrals and appointments  In addition, I have reviewed and discussed with patient certain preventive protocols, quality metrics, and best practice recommendations. A written personalized care plan for preventive services as well as general preventive health recommendations were provided to patient.     Aubrey Leaf, CMA   09/09/2023   After Visit Summary: (MyChart) Due to this being a telephonic visit, the after visit summary with patients personalized plan was offered to patient via MyChart   Nurse Notes:    Dawn Bailey is a 89 y.o. female patient of Metheney, Corita Diego, MD who had a Medicare Annual Wellness Visit today via telephone. Dawn Bailey is Retired and lives with her daughter. She has 2 children. She reports that she is socially active and does interact with friends/family regularly. She is minimally physically active and enjoys crossword puzzles and reading.

## 2023-09-16 ENCOUNTER — Other Ambulatory Visit: Payer: Self-pay | Admitting: Family Medicine

## 2023-09-17 ENCOUNTER — Ambulatory Visit: Payer: Self-pay

## 2023-09-17 ENCOUNTER — Other Ambulatory Visit: Payer: Self-pay

## 2023-09-17 MED ORDER — TIZANIDINE HCL 2 MG PO TABS: ORAL_TABLET | ORAL | 1 refills | Status: DC

## 2023-09-17 MED ORDER — TIZANIDINE HCL 2 MG PO TABS: 2.0000 mg | ORAL_TABLET | Freq: Every day | ORAL | 1 refills | Status: DC | PRN

## 2023-09-17 NOTE — Telephone Encounter (Signed)
 I called the pharmacy and they state they didn't receive a refill on the last prescription. I will send in another prescription.

## 2023-09-17 NOTE — Telephone Encounter (Signed)
 No triage: Tizanidine  was denied and pt would like more info. Please advise.     Copied from CRM [id number]. Topic: Clinical - Prescription Issue >> Sep 17, 2023  1:52 PM Adrianna P wrote: Reason for CRM: Pharmacy said tiZANidine  (ZANAFLEX ) 2 MG tablet  was denied patient would like to be contacted at [phone] Reason for Disposition . [1] Caller requesting NON-URGENT health information AND [2] PCP's office is the best resource  Protocols used: Information Only Call - No Triage-A-AH

## 2023-09-17 NOTE — Addendum Note (Signed)
 Addended by: Doretha Ganja on: 09/17/2023 02:34 PM   Modules accepted: Orders

## 2023-09-17 NOTE — Telephone Encounter (Signed)
 Patient advised.

## 2023-09-24 DIAGNOSIS — D469 Myelodysplastic syndrome, unspecified: Secondary | ICD-10-CM | POA: Diagnosis not present

## 2023-09-24 DIAGNOSIS — D509 Iron deficiency anemia, unspecified: Secondary | ICD-10-CM | POA: Diagnosis not present

## 2023-10-05 ENCOUNTER — Other Ambulatory Visit: Payer: Self-pay | Admitting: Family Medicine

## 2023-11-06 ENCOUNTER — Other Ambulatory Visit: Payer: Self-pay | Admitting: Family Medicine

## 2023-11-06 DIAGNOSIS — F5101 Primary insomnia: Secondary | ICD-10-CM

## 2023-12-03 ENCOUNTER — Other Ambulatory Visit: Payer: Self-pay | Admitting: Family Medicine

## 2023-12-17 DIAGNOSIS — Z13220 Encounter for screening for lipoid disorders: Secondary | ICD-10-CM | POA: Diagnosis not present

## 2023-12-17 DIAGNOSIS — D509 Iron deficiency anemia, unspecified: Secondary | ICD-10-CM | POA: Diagnosis not present

## 2023-12-17 DIAGNOSIS — D469 Myelodysplastic syndrome, unspecified: Secondary | ICD-10-CM | POA: Diagnosis not present

## 2024-01-02 ENCOUNTER — Other Ambulatory Visit: Payer: Self-pay | Admitting: Family Medicine

## 2024-01-03 ENCOUNTER — Other Ambulatory Visit: Payer: Self-pay | Admitting: Family Medicine

## 2024-01-04 ENCOUNTER — Other Ambulatory Visit: Payer: Self-pay | Admitting: Family Medicine

## 2024-02-03 ENCOUNTER — Other Ambulatory Visit: Payer: Self-pay | Admitting: Family Medicine

## 2024-02-03 DIAGNOSIS — F5101 Primary insomnia: Secondary | ICD-10-CM

## 2024-02-04 ENCOUNTER — Other Ambulatory Visit: Payer: Self-pay | Admitting: Family Medicine

## 2024-03-09 ENCOUNTER — Ambulatory Visit (INDEPENDENT_AMBULATORY_CARE_PROVIDER_SITE_OTHER): Admitting: Family Medicine

## 2024-03-09 ENCOUNTER — Encounter: Payer: Self-pay | Admitting: Family Medicine

## 2024-03-09 VITALS — BP 128/63 | HR 85 | Ht 66.0 in | Wt 178.0 lb

## 2024-03-09 DIAGNOSIS — M545 Low back pain, unspecified: Secondary | ICD-10-CM | POA: Diagnosis not present

## 2024-03-09 DIAGNOSIS — M79605 Pain in left leg: Secondary | ICD-10-CM

## 2024-03-09 MED ORDER — PREDNISONE 20 MG PO TABS: 40.0000 mg | ORAL_TABLET | Freq: Every day | ORAL | 0 refills | Status: DC

## 2024-03-09 MED ORDER — BACLOFEN 10 MG PO TABS: 10.0000 mg | ORAL_TABLET | Freq: Two times a day (BID) | ORAL | 0 refills | Status: DC | PRN

## 2024-03-09 NOTE — Progress Notes (Signed)
 Acute Office Visit  Patient ID: Dawn Bailey, female    DOB: 19-Feb-1935, 89 y.o.   MRN: [id number]  PCP: Alvan Dorothyann BIRCH, MD  Chief Complaint  Patient presents with   Hip Pain    Subjective:     HPI  Discussed the use of AI scribe software for clinical note transcription with the patient, who gave verbal consent to proceed.  History of Present Illness Dawn Bailey is an 89 year old female with a history of bursitis and sciatica who presents with left hip pain.  Left hip pain - Onset after bending over to retrieve dog food, without associated fall - Pain exacerbated by lifting, walking, putting pressure on the hip, getting up, and laughing - Significant sensitivity to touch in the affected area - Pain localized to the left hip, recently extending slightly into the groin area - No radiation of pain down the leg - Pain described as similar to previous episodes of bursitis  Prior treatments and response - Using Tylenol  for pain relief - No topical treatments applied due to sensitivity of the area - No recent use of muscle relaxers, though previously found helpful when taken as needed  History of musculoskeletal conditions - History of bursitis with current pain resembling prior episodes - History of sciatica, but current pain feels different from previous sciatica   ROS     Objective:    BP 128/63   Pulse 85   Ht 5' 6 (1.676 m)   Wt 178 lb 0.6 oz (80.8 kg)   SpO2 98%   BMI 28.74 kg/m    Physical Exam Vitals reviewed.  Constitutional:      Appearance: Normal appearance.  HENT:     Head: Normocephalic.  Pulmonary:     Effort: Pulmonary effort is normal.  Musculoskeletal:     Comments: Left hip with some pain with flexion while standing no pain with extension or abduction.  Hip, knee and ankle strength is 5 out of 5 bilaterally.  Negative straight leg raise.  Nontender with internal/external rotation of the left hip.  She is mildly tender over the  lumbar spine and just lateral to the spine nontender over the SI joint and no significant tenderness directly over the trochanteric bursa.  Neurological:     Mental Status: She is alert and oriented to person, place, and time.  Psychiatric:        Mood and Affect: Mood normal.        Behavior: Behavior normal.       No results found for any visits on 03/09/24.     Assessment & Plan:   Problem List Items Addressed This Visit   None Visit Diagnoses       Low back pain radiating to left leg    -  Primary   Relevant Medications   predniSONE  (DELTASONE ) 20 MG tablet   baclofen (LIORESAL) 10 MG tablet       Assessment and Plan Assessment & Plan Lumbar radiculopathy Acute lumbar radiculopathy likely due to disc impingement. Pain localized to left side, radiating slightly into groin. No hip joint involvement. Differential includes bursitis, but lumbar origin more likely. No trauma reported, reducing fracture concern. - Prescribed prednisone  for inflammation and nerve pain. - Initiated home physical therapy exercises to alleviate disc pressure. - Advised heat or ice application based on comfort, cautioning against prolonged heat. - Recommended muscle relaxers as needed. - Instructed to avoid heavy lifting and bending. - Consider formal physical therapy  if no improvement with home exercises. - Consider x-ray if no improvement in a few weeks.    Meds ordered this encounter  Medications   predniSONE  (DELTASONE ) 20 MG tablet    Sig: Take 2 tablets (40 mg total) by mouth daily with breakfast.    Dispense:  10 tablet    Refill:  0   baclofen (LIORESAL) 10 MG tablet    Sig: Take 1 tablet (10 mg total) by mouth 2 (two) times daily as needed for muscle spasms.    Dispense:  30 each    Refill:  0    No follow-ups on file.  Dorothyann Byars, MD [facility] at [facility]

## 2024-03-10 ENCOUNTER — Other Ambulatory Visit: Payer: Self-pay | Admitting: Family Medicine

## 2024-03-19 ENCOUNTER — Other Ambulatory Visit: Payer: Self-pay | Admitting: Family Medicine

## 2024-03-19 ENCOUNTER — Encounter: Payer: Self-pay | Admitting: Family Medicine

## 2024-03-20 MED ORDER — TIZANIDINE HCL 2 MG PO TABS: ORAL_TABLET | ORAL | 0 refills | Status: DC

## 2024-03-20 NOTE — Telephone Encounter (Signed)
 Meds ordered this encounter  Medications   tiZANidine  (ZANAFLEX ) 2 MG tablet    Sig: TAKE ONE TABLET BY MOUTH DAILY AS NEEDED FOR MUSCLE SPASMS    Dispense:  20 tablet    Refill:  0

## 2024-03-21 ENCOUNTER — Other Ambulatory Visit: Payer: Self-pay | Admitting: Family Medicine

## 2024-03-24 ENCOUNTER — Other Ambulatory Visit: Payer: Self-pay | Admitting: Family Medicine

## 2024-04-02 ENCOUNTER — Encounter: Payer: Self-pay | Admitting: Family Medicine

## 2024-04-04 ENCOUNTER — Ambulatory Visit: Admitting: Family Medicine

## 2024-04-13 ENCOUNTER — Ambulatory Visit: Payer: Self-pay

## 2024-04-13 NOTE — Telephone Encounter (Signed)
 FYI Only or Action Required?: Action required by provider: request for appointment.  Patient was last seen in primary care on 03/09/2024 by Dawn Dorothyann BIRCH, MD.  Called Nurse Triage reporting Leg Pain.  Symptoms began several days ago.  Interventions attempted: Nothing.  Symptoms are: gradually improving.Having pain to left thigh. Stopped the Flagyl  she was on from recent hospitalization. Getting better. Requests VV with PCP only. No availability. Please advise pt.  Triage Disposition: See PCP When Office is Open (Within 3 Days)  Patient/caregiver understands and will follow disposition?: Yes       Copied from CRM #[id number]. Topic: Clinical - Red Word Triage >> Apr 13, 2024  8:55 AM [name] wrote: Red Word that prompted transfer to Nurse Triage: Patient states she has a painful burning sensation in her left thigh muscle. States she thinks it was a side effect of a medication she was on after she was in the ER. Has stopped the medication but still has the symptom. Reason for Disposition  [1] MODERATE pain (e.g., interferes with normal activities, limping) AND [2] present > 3 days  Answer Assessment - Initial Assessment Questions 1. ONSET: When did the pain start?      2 days 2. LOCATION: Where is the pain located?      Left thigh 3. PAIN: How bad is the pain?    (Scale 1-10; or mild, moderate, severe)     severe 4. WORK OR EXERCISE: Has there been any recent work or exercise that involved this part of the body?      no 5. CAUSE: What do you think is causing the leg pain?     Flagyl  6. OTHER SYMPTOMS: Do you have any other symptoms? (e.g., chest pain, back pain, breathing difficulty, swelling, rash, fever, numbness, weakness)     no 7. PREGNANCY: Is there any chance you are pregnant? When was your last menstrual period?     no  Protocols used: Leg Pain-A-AH

## 2024-04-14 NOTE — Telephone Encounter (Signed)
 Patient is scheduled for 04/18/2024 At 10:10 am with Dr. Alvan- for evaluation of bilateral leg weakness.

## 2024-04-14 NOTE — Telephone Encounter (Signed)
 Ok to schedule next week, since was evaluated

## 2024-04-18 ENCOUNTER — Encounter: Payer: Self-pay | Admitting: Family Medicine

## 2024-04-18 ENCOUNTER — Ambulatory Visit: Admitting: Family Medicine

## 2024-04-18 VITALS — BP 134/65 | HR 81 | Ht 66.0 in | Wt 175.0 lb

## 2024-04-18 DIAGNOSIS — R3915 Urgency of urination: Secondary | ICD-10-CM

## 2024-04-18 DIAGNOSIS — M79652 Pain in left thigh: Secondary | ICD-10-CM

## 2024-04-18 DIAGNOSIS — M51362 Other intervertebral disc degeneration, lumbar region with discogenic back pain and lower extremity pain: Secondary | ICD-10-CM

## 2024-04-18 DIAGNOSIS — F411 Generalized anxiety disorder: Secondary | ICD-10-CM

## 2024-04-18 LAB — POCT URINALYSIS DIP (CLINITEK)
Bilirubin, UA: NEGATIVE
Blood, UA: NEGATIVE
Glucose, UA: NEGATIVE mg/dL
Ketones, POC UA: NEGATIVE mg/dL
Leukocytes, UA: NEGATIVE
Nitrite, UA: NEGATIVE
POC PROTEIN,UA: NEGATIVE
Spec Grav, UA: 1.01 (ref 1.010–1.025)
Urobilinogen, UA: 0.2 U/dL
pH, UA: 6 (ref 5.0–8.0)

## 2024-04-18 MED ORDER — PREDNISONE 20 MG PO TABS: 40.0000 mg | ORAL_TABLET | Freq: Every day | ORAL | 0 refills | Status: DC

## 2024-04-18 MED ORDER — CLONAZEPAM 0.5 MG PO TABS: 0.5000 mg | ORAL_TABLET | Freq: Every day | ORAL | 1 refills | Status: AC | PRN

## 2024-04-18 MED ORDER — TIZANIDINE HCL 2 MG PO TABS: 2.0000 mg | ORAL_TABLET | Freq: Two times a day (BID) | ORAL | 0 refills | Status: DC | PRN

## 2024-04-18 NOTE — Progress Notes (Signed)
 Acute Office Visit  Patient ID: Dawn Bailey, female    DOB: 01/31/1935, 89 y.o.   MRN: [id number]  PCP: Alvan Dorothyann BIRCH, MD  Chief Complaint  Patient presents with   Follow-up    Pt was seen at [facility] for 12/2 for Diverticulitis    Subjective:     HPI  Discussed the use of AI scribe software for clinical note transcription with the patient, who gave verbal consent to proceed.  History of Present Illness Dawn Bailey is an 89 year old female who presents for follow-up after an emergency department visit for abdominal pain.  Abdominal pain and gastrointestinal symptoms - Persistent abdominal pain since December 2nd emergency department visit - Discomfort primarily occurs when needing to use the bathroom - Return of diarrhea - Increased urinary urgency with minimal output - CT abdomen/pelvis with contrast revealed moderate-sized tidal hernia and diverticular disease with segmental mural thickening of the sigmoid and lower descending colon - Low white blood cell count at time of ED visit  Lower back and hip pain - Pain across the lower back and hip bones - No recent injuries or falls  Lower extremity neuropathy - Leg pain, numbness, and sensation of heat in the leg began three days after starting Cipro  500 mg for diverticulitis - Pain radiated to various areas, including the back - Symptoms led to discontinuation of Cipro  after six days  Medication use and side effects - Did not complete course of metronidazole  - Uses Tylenol  and Zanaflex  for pain management - Runs out of Zanaflex  frequently - Zanaflex  restricts ability to drive and is not preferred - Uses Atarax  for anxiety but finds it ineffective during panic attacks  CT lumbar spine 05/25/23: Degenerative changes: Moderate to advanced multilevel degenerative disc disease with complete disc height loss at L1-L2 with scattered areas of vacuum disc phenomenon. Mild mid lumbar levoscoliosis. Moderate  multilevel facet arthropathy. Discogenic disease contributes to at least mild central stenosis at several mid and lower lumbar levels. Discogenic and hypertrophic facet disease also contribute to varying degrees of foraminal stenosis throughout including moderate to high-grade foraminal stenosis at a few levels particularly on the left at L4-L5 and L5-S1.    ROS     Objective:    BP 134/65   Pulse 81   Ht 5' 6 (1.676 m)   Wt 175 lb (79.4 kg)   SpO2 100%   BMI 28.25 kg/m    Physical Exam Vitals reviewed.  Constitutional:      Appearance: Normal appearance.  HENT:     Head: Normocephalic.  Pulmonary:     Effort: Pulmonary effort is normal.  Musculoskeletal:     Comments: Left hip flexion is weaker than the right with lifting the knee on her own and also with resistance.  Knees with symmetric flexion and extension against resistance.  Mild tenderness over the lower lumbar spine and paraspinous muscles.  Mild tenderness of the over the left greater trochanter.  And some tenderness over the right inner groin crease.  Neurological:     Mental Status: She is alert and oriented to person, place, and time.  Psychiatric:        Mood and Affect: Mood normal.        Behavior: Behavior normal.       Results for orders placed or performed in visit on 04/18/24  POCT URINALYSIS DIP (CLINITEK)  Result Value Ref Range   Color, UA yellow yellow   Clarity, UA clear clear  Glucose, UA negative negative mg/dL   Bilirubin, UA negative negative   Ketones, POC UA negative negative mg/dL   Spec Grav, UA 8.989 8.989 - 1.025   Blood, UA negative negative   pH, UA 6.0 5.0 - 8.0   POC PROTEIN,UA negative negative, trace   Urobilinogen, UA 0.2 0.2 or 1.0 E.U./dL   Nitrite, UA Negative Negative   Leukocytes, UA Negative Negative       Assessment & Plan:   Problem List Items Addressed This Visit       Musculoskeletal and Integument   Lumbar degenerative disc disease   Relevant  Medications   predniSONE  (DELTASONE ) 20 MG tablet   tiZANidine  (ZANAFLEX ) 2 MG tablet     Other   GAD (generalized anxiety disorder)   Relevant Medications   clonazePAM  (KLONOPIN ) 0.5 MG tablet   Other Visit Diagnoses       Left thigh pain    -  Primary   Relevant Medications   predniSONE  (DELTASONE ) 20 MG tablet     Urinary urgency       Relevant Orders   POCT URINALYSIS DIP (CLINITEK) (Completed)       Assessment and Plan Assessment & Plan Acute diverticulitis Segmental mural thickening of the sigmoid and lower descending colon with subtle infiltration. Symptoms include abdominal pain, diarrhea, and urinary urgency. Low white blood cell count noted. Differential includes urinary tract infection. - Ordered urine test for urinary tract infection. - did complete 6 out of 7 days of ABX  Lumbar radiculopathy Pain radiating to the left anterior thigh and groin with numbness and weakness suggests nerve involvement, possibly lumbar radiculopathy. Differential includes tendinitis and neuropathy. (Hx of  high-grade foraminal stenosis at a few levels particularly on the left at L4-L5 and L5-S1.)  - Prescribe prednisone  for 5 days. - Monitor for improvement in strength, numbness, and pain. - Consider further imaging if no improvement.  Urinary tract infection Urinary urgency with minimal output suggests possible urinary tract infection. - Ordered urine test for urinary tract infection.  Panic disorder Experiencing panic attacks 3-4 times a month. Current Atarax  management insufficient. Previous buspirone  trial ineffective. Discussed Atarax  side effects and benzodiazepine risks. - Continue Atarax  as needed with caution. - bupsar not effective.  - Encouraged non-pharmacological anxiety management strategies.    Meds ordered this encounter  Medications   predniSONE  (DELTASONE ) 20 MG tablet    Sig: Take 2 tablets (40 mg total) by mouth daily with breakfast.    Dispense:  10 tablet     Refill:  0   tiZANidine  (ZANAFLEX ) 2 MG tablet    Sig: Take 1 tablet (2 mg total) by mouth 2 (two) times daily as needed for muscle spasms. TAKE ONE TABLET BY MOUTH DAILY AS NEEDED FOR MUSCLE SPASMS    Dispense:  40 tablet    Refill:  0   clonazePAM  (KLONOPIN ) 0.5 MG tablet    Sig: Take 1 tablet (0.5 mg total) by mouth daily as needed for anxiety (for severe panic attacks).    Dispense:  10 tablet    Refill:  1    No follow-ups on file.  Dorothyann Byars, MD [facility] at [facility]

## 2024-04-20 ENCOUNTER — Telehealth: Payer: Self-pay

## 2024-04-20 NOTE — Telephone Encounter (Signed)
 Copied from CRM #[id number]. Topic: Clinical - Medication Question >> Apr 19, 2024  4:11 PM Avram MATSU wrote: Reason for CRM: patient called about her medications and have some questions about the approval process. Pease advise [phone]

## 2024-04-20 NOTE — Telephone Encounter (Signed)
 Spoke with the patient in regards to her tizanidine  2mg  and advised her of the PA process, I also forwarded to them to initiate the process.

## 2024-04-21 ENCOUNTER — Telehealth: Payer: Self-pay

## 2024-04-21 NOTE — Telephone Encounter (Signed)
 Please advise. Current SIG on the rx is: Take 1 tablet (2 mg total) by mouth 2 (two) times daily as needed for muscle spasms. TAKE ONE TABLET BY MOUTH DAILY AS NEEDED FOR MUSCLE SPASMS.  What is the correct SIG for the rx? Thanks in advance.

## 2024-04-21 NOTE — Telephone Encounter (Signed)
 Patient called back, states was told by pharmacy, this med isnt about a PA but that they need more information on the directions for the med

## 2024-04-21 NOTE — Telephone Encounter (Signed)
 Copied from CRM [id number]. Topic: Clinical - Prescription Issue >> Apr 21, 2024 11:03 AM Shanda MATSU wrote: Reason for CRM: Leonce w/[facility] called to get clarification on instructions for med, tiZANidine  (ZANAFLEX ) 2 MG tablet, caller stated they need to know if the patient is supposed to take med once or twice a day.

## 2024-04-22 ENCOUNTER — Encounter: Payer: Self-pay | Admitting: Family Medicine

## 2024-04-22 DIAGNOSIS — M51362 Other intervertebral disc degeneration, lumbar region with discogenic back pain and lower extremity pain: Secondary | ICD-10-CM

## 2024-04-24 MED ORDER — TIZANIDINE HCL 2 MG PO TABS: 2.0000 mg | ORAL_TABLET | Freq: Two times a day (BID) | ORAL | 0 refills | Status: DC | PRN

## 2024-04-24 NOTE — Telephone Encounter (Signed)
 Already a separate note open in this regard so I am going to close this note.

## 2024-04-24 NOTE — Telephone Encounter (Signed)
 Patient wanted to be sure that Dr. Alvan is aware that the pharmacy had only given her quantity #30 of the Tizanidine  at the pharmacy ( she states Dr. Alvan had written for #40  but the pharmacy only filled #30)  She is happy with the #30 that was received for right now but just wanted to be sure that Dr. Alvan is aware as this may cause her to request a refill earlier than she normally would.

## 2024-04-24 NOTE — Telephone Encounter (Signed)
Ok to change to BID PRN.

## 2024-04-24 NOTE — Telephone Encounter (Signed)
LM for pt to return the call

## 2024-05-02 ENCOUNTER — Ambulatory Visit: Admitting: Family Medicine

## 2024-05-02 ENCOUNTER — Encounter: Payer: Self-pay | Admitting: Family Medicine

## 2024-05-02 VITALS — BP 130/78 | HR 99 | Ht 66.0 in | Wt 175.0 lb

## 2024-05-02 DIAGNOSIS — Z7689 Persons encountering health services in other specified circumstances: Secondary | ICD-10-CM | POA: Insufficient documentation

## 2024-05-02 DIAGNOSIS — Z713 Dietary counseling and surveillance: Secondary | ICD-10-CM | POA: Diagnosis not present

## 2024-05-02 MED ORDER — TIRZEPATIDE-WEIGHT MANAGEMENT 2.5 MG/0.5ML ~~LOC~~ SOLN: 2.5000 mg | SUBCUTANEOUS | 0 refills | Status: DC

## 2024-05-02 NOTE — Assessment & Plan Note (Signed)
 Visit #: 1 Starting Weight: 175 lbs/BMI 28   Current weight: 175 lb  Previous weight:  Change in weight: Goal weight:  Dietary goals: work on eating protein first and then veggies Exercise goals: work on emergency planning/management officer, walking more, leg lifts, modifed squats.  Medication: Zepbound 2.5mg   Follow-up and referrals: 8 weeks.

## 2024-05-02 NOTE — Progress Notes (Signed)
 "  Established Patient Office Visit  Patient ID: Dawn Bailey, female    DOB: 09-03-1934  Age: 89 y.o. MRN: [id number] PCP: Alvan Dorothyann BIRCH, MD  No chief complaint on file.   Subjective:     HPI  Discussed the use of AI scribe software for clinical note transcription with the patient, who gave verbal consent to proceed.  History of Present Illness Dawn Bailey is an 89 year old female who presents with interest in weight loss medication options.  Obesity and weight management - Seeking information on weight loss medications, specifically Zepbound and Ozempic - Researched cost and availability of these medications; aware that Medicare does not cover them - Discussed self-pay options with a pharmacy - Motivated to lose weight following a recent emergency room visit where she was unable to move onto a CT scan table due to her weight - Aware of potential side effects of GLP-1 medications, including nausea and constipation  Dietary habits and modifications - Focused on improving diet by prioritizing protein intake - Emphasizes the importance of vegetables in her diet - Identifies chocolate consumption as a stress-related eating habit and is considering strategies to limit intake to support weight loss goals  Physical activity and functional status - Recognizes the need for physical activity to prevent muscle loss during weight loss - Has resistance bands and weights at home to incorporate into her exercise routine - Motivated to maintain muscle mass and improve ability to perform daily activities     ROS    Objective:     BP 130/78   Pulse 99   Ht 5' 6 (1.676 m)   Wt 175 lb (79.4 kg)   SpO2 100%   BMI 28.25 kg/m    Physical Exam Vitals reviewed.  Constitutional:      Appearance: Normal appearance.  HENT:     Head: Normocephalic.  Pulmonary:     Effort: Pulmonary effort is normal.  Neurological:     Mental Status: She is alert and oriented to person,  place, and time.  Psychiatric:        Mood and Affect: Mood normal.        Behavior: Behavior normal.      No results found for any visits on 05/02/24.    The ASCVD Risk score (Arnett DK, et al., 2019) failed to calculate for the following reasons:   The 2019 ASCVD risk score is only valid for ages 49 to 88   * - Cholesterol units were assumed    Assessment & Plan:   Problem List Items Addressed This Visit       Other   Encounter for weight management - Primary   Visit #: 1 Starting Weight: 175 lbs/BMI 28   Current weight: 175 lb  Previous weight:  Change in weight: Goal weight:  Dietary goals: work on eating protein first and then veggies Exercise goals: work on emergency planning/management officer, walking more, leg lifts, modifed squats.  Medication: Zepbound 2.5mg   Follow-up and referrals: 8 weeks.        Relevant Medications   tirzepatide (ZEPBOUND) 2.5 MG/0.5ML injection vial    Assessment and Plan Assessment & Plan Obesity Management with GLP-1 receptor agonist therapy. Preferred Zepbound for stronger effect and better nausea profile. Emphasized appetite suppression, potential side effects, and importance of dietary modifications and resistance training. - Prescribed Zepbound to be filled at [facility]. - Instructed to monitor for side effects: nausea, constipation, heartburn. - Advised dietary modifications: protein first, then vegetables,  carbohydrates. - Recommended resistance training and walking to prevent muscle loss. - Scheduled follow-up in 7-8 weeks to assess progress and adjust medication dosage.   Return in about 8 weeks (around 06/27/2024) for New start medication/Weight loss .    I spent 30 minutes on the day of the encounter to include pre-visit record review, face-to-face time with the patient and post visit ordering of test.   Dorothyann Byars, MD [facility] at [facility]   "

## 2024-05-04 ENCOUNTER — Other Ambulatory Visit: Payer: Self-pay | Admitting: Family Medicine

## 2024-05-04 DIAGNOSIS — F5101 Primary insomnia: Secondary | ICD-10-CM

## 2024-05-07 ENCOUNTER — Encounter: Payer: Self-pay | Admitting: Family Medicine

## 2024-05-07 DIAGNOSIS — M51362 Other intervertebral disc degeneration, lumbar region with discogenic back pain and lower extremity pain: Secondary | ICD-10-CM

## 2024-05-07 DIAGNOSIS — M79605 Pain in left leg: Secondary | ICD-10-CM

## 2024-05-08 NOTE — Telephone Encounter (Signed)
 Orders Placed This Encounter  Procedures   Ambulatory referral to Physical Therapy    Referral Priority:   Urgent    Referral Type:   Physical Medicine    Referral Reason:   Specialty Services Required    Requested Specialty:   Physical Therapy    Number of Visits Requested:   1

## 2024-05-14 ENCOUNTER — Encounter: Payer: Self-pay | Admitting: Family Medicine

## 2024-05-15 NOTE — Telephone Encounter (Signed)
 OK for Rx for rollator

## 2024-05-17 NOTE — Telephone Encounter (Signed)
 Last read by Cathryne JULIANNA Favre at 1:21PM on 05/16/2024.

## 2024-05-29 ENCOUNTER — Ambulatory Visit: Admitting: Family Medicine

## 2024-05-30 ENCOUNTER — Other Ambulatory Visit: Payer: Self-pay | Admitting: Family Medicine

## 2024-05-30 DIAGNOSIS — Z7689 Persons encountering health services in other specified circumstances: Secondary | ICD-10-CM

## 2024-05-30 DIAGNOSIS — M51362 Other intervertebral disc degeneration, lumbar region with discogenic back pain and lower extremity pain: Secondary | ICD-10-CM

## 2024-05-30 NOTE — Telephone Encounter (Signed)
 Request received for refill of  Zepbound  2.5mg   ( should this strength be increased ?)  Last written 05/02/2024 and  Tizanidine  2mg  Last written 04/24/2024 Last OV 05/02/2024 Upcoming appt 09/12/2024

## 2024-05-31 NOTE — Telephone Encounter (Signed)
 Attempted call to patient. Left a voicemail message requesting a return call. Left my direct # for return call.

## 2024-05-31 NOTE — Telephone Encounter (Signed)
 Check with pt aboutr increase

## 2024-05-31 NOTE — Telephone Encounter (Signed)
 Left message for a return call

## 2024-06-01 NOTE — Telephone Encounter (Signed)
 Patient states she would like to stay at current dose of Zepbound  .

## 2024-09-12 ENCOUNTER — Ambulatory Visit
